# Patient Record
Sex: Male | Born: 1935 | Race: White | Hispanic: No | Marital: Married | Smoking: Former smoker
Health system: Southern US, Community
[De-identification: ages and names within clinical notes are randomized; demographics above are authoritative.]

## PROBLEM LIST (undated history)

## (undated) DIAGNOSIS — I255 Ischemic cardiomyopathy: Secondary | ICD-10-CM

## (undated) DIAGNOSIS — E782 Mixed hyperlipidemia: Secondary | ICD-10-CM

## (undated) DIAGNOSIS — N529 Male erectile dysfunction, unspecified: Secondary | ICD-10-CM

## (undated) DIAGNOSIS — Z66 Do not resuscitate: Secondary | ICD-10-CM

## (undated) DIAGNOSIS — M1990 Unspecified osteoarthritis, unspecified site: Secondary | ICD-10-CM

## (undated) DIAGNOSIS — I469 Cardiac arrest, cause unspecified: Secondary | ICD-10-CM

## (undated) DIAGNOSIS — I10 Essential (primary) hypertension: Secondary | ICD-10-CM

## (undated) DIAGNOSIS — I219 Acute myocardial infarction, unspecified: Secondary | ICD-10-CM

## (undated) DIAGNOSIS — I2510 Atherosclerotic heart disease of native coronary artery without angina pectoris: Secondary | ICD-10-CM

## (undated) DIAGNOSIS — M069 Rheumatoid arthritis, unspecified: Secondary | ICD-10-CM

## (undated) HISTORY — DX: Mixed hyperlipidemia: E78.2

## (undated) HISTORY — DX: Acute myocardial infarction, unspecified: I21.9

## (undated) HISTORY — DX: Male erectile dysfunction, unspecified: N52.9

## (undated) HISTORY — DX: Essential (primary) hypertension: I10

## (undated) HISTORY — DX: Unspecified osteoarthritis, unspecified site: M19.90

## (undated) HISTORY — PX: CARPAL TUNNEL RELEASE: SHX101

## (undated) HISTORY — PX: NECK SURGERY: SHX720

## (undated) HISTORY — DX: Atherosclerotic heart disease of native coronary artery without angina pectoris: I25.10

## (undated) HISTORY — PX: ROTATOR CUFF REPAIR: SHX139

---

## 1985-08-06 HISTORY — PX: CORONARY ARTERY BYPASS GRAFT: SHX141

## 1997-12-30 ENCOUNTER — Other Ambulatory Visit: Admission: RE | Admit: 1997-12-30 | Discharge: 1997-12-30 | Payer: Self-pay | Admitting: Cardiology

## 2000-12-31 ENCOUNTER — Ambulatory Visit (HOSPITAL_COMMUNITY): Admission: RE | Admit: 2000-12-31 | Discharge: 2000-12-31 | Payer: Self-pay | Admitting: Dermatology

## 2001-09-29 ENCOUNTER — Encounter: Payer: Self-pay | Admitting: Neurosurgery

## 2001-10-01 ENCOUNTER — Ambulatory Visit (HOSPITAL_COMMUNITY): Admission: RE | Admit: 2001-10-01 | Discharge: 2001-10-01 | Payer: Self-pay | Admitting: Neurosurgery

## 2002-03-18 ENCOUNTER — Encounter: Payer: Self-pay | Admitting: Neurosurgery

## 2002-03-18 ENCOUNTER — Ambulatory Visit (HOSPITAL_COMMUNITY): Admission: RE | Admit: 2002-03-18 | Discharge: 2002-03-18 | Payer: Self-pay | Admitting: Neurosurgery

## 2002-04-02 ENCOUNTER — Encounter: Payer: Self-pay | Admitting: Neurosurgery

## 2002-04-02 ENCOUNTER — Inpatient Hospital Stay (HOSPITAL_COMMUNITY): Admission: RE | Admit: 2002-04-02 | Discharge: 2002-04-03 | Payer: Self-pay | Admitting: Neurosurgery

## 2003-04-01 ENCOUNTER — Other Ambulatory Visit: Admission: RE | Admit: 2003-04-01 | Discharge: 2003-04-01 | Payer: Self-pay | Admitting: Unknown Physician Specialty

## 2003-04-25 ENCOUNTER — Emergency Department (HOSPITAL_COMMUNITY): Admission: EM | Admit: 2003-04-25 | Discharge: 2003-04-26 | Payer: Self-pay | Admitting: *Deleted

## 2003-04-25 ENCOUNTER — Encounter: Payer: Self-pay | Admitting: *Deleted

## 2003-04-29 ENCOUNTER — Other Ambulatory Visit: Admission: RE | Admit: 2003-04-29 | Discharge: 2003-04-29 | Payer: Self-pay | Admitting: Dermatology

## 2003-11-02 ENCOUNTER — Ambulatory Visit (HOSPITAL_COMMUNITY): Admission: RE | Admit: 2003-11-02 | Discharge: 2003-11-02 | Payer: Self-pay | Admitting: Unknown Physician Specialty

## 2003-11-15 ENCOUNTER — Ambulatory Visit (HOSPITAL_COMMUNITY): Admission: RE | Admit: 2003-11-15 | Discharge: 2003-11-15 | Payer: Self-pay | Admitting: Unknown Physician Specialty

## 2003-12-01 ENCOUNTER — Encounter: Admission: RE | Admit: 2003-12-01 | Discharge: 2004-01-27 | Payer: Self-pay | Admitting: Orthopaedic Surgery

## 2004-01-19 ENCOUNTER — Ambulatory Visit (HOSPITAL_COMMUNITY): Admission: RE | Admit: 2004-01-19 | Discharge: 2004-01-19 | Payer: Self-pay | Admitting: Orthopaedic Surgery

## 2004-03-27 ENCOUNTER — Encounter: Admission: RE | Admit: 2004-03-27 | Discharge: 2004-06-25 | Payer: Self-pay | Admitting: Orthopedic Surgery

## 2004-06-26 ENCOUNTER — Encounter: Admission: RE | Admit: 2004-06-26 | Discharge: 2004-08-21 | Payer: Self-pay | Admitting: Orthopedic Surgery

## 2004-08-16 ENCOUNTER — Ambulatory Visit (HOSPITAL_COMMUNITY): Admission: RE | Admit: 2004-08-16 | Discharge: 2004-08-16 | Payer: Self-pay | Admitting: Cardiology

## 2005-03-19 ENCOUNTER — Ambulatory Visit: Payer: Self-pay | Admitting: Family Medicine

## 2005-04-02 ENCOUNTER — Ambulatory Visit: Payer: Self-pay | Admitting: Family Medicine

## 2007-06-23 ENCOUNTER — Encounter: Admission: RE | Admit: 2007-06-23 | Discharge: 2007-06-23 | Payer: Self-pay | Admitting: Neurosurgery

## 2007-07-01 ENCOUNTER — Encounter: Admission: RE | Admit: 2007-07-01 | Discharge: 2007-08-06 | Payer: Self-pay | Admitting: Neurosurgery

## 2007-08-07 ENCOUNTER — Encounter: Admission: RE | Admit: 2007-08-07 | Discharge: 2007-11-05 | Payer: Self-pay | Admitting: Neurosurgery

## 2007-11-07 ENCOUNTER — Encounter: Admission: RE | Admit: 2007-11-07 | Discharge: 2007-12-23 | Payer: Self-pay | Admitting: Neurosurgery

## 2008-01-02 ENCOUNTER — Encounter: Admission: RE | Admit: 2008-01-02 | Discharge: 2008-01-02 | Payer: Self-pay | Admitting: Obstetrics and Gynecology

## 2008-01-20 ENCOUNTER — Inpatient Hospital Stay (HOSPITAL_COMMUNITY): Admission: EM | Admit: 2008-01-20 | Discharge: 2008-01-22 | Payer: Self-pay | Admitting: Emergency Medicine

## 2008-05-31 ENCOUNTER — Encounter: Payer: Self-pay | Admitting: Cardiology

## 2010-04-18 ENCOUNTER — Ambulatory Visit: Payer: Self-pay | Admitting: Cardiology

## 2010-10-18 ENCOUNTER — Ambulatory Visit (INDEPENDENT_AMBULATORY_CARE_PROVIDER_SITE_OTHER): Payer: Medicare Other | Admitting: Cardiology

## 2010-10-18 DIAGNOSIS — I2510 Atherosclerotic heart disease of native coronary artery without angina pectoris: Secondary | ICD-10-CM

## 2010-12-19 NOTE — Discharge Summary (Signed)
[name], [name]                 ACCOUNT NO.:  [id number]   MEDICAL RECORD NO.:  [id number]          PATIENT TYPE:  INP   LOCATION:  A306                          FACILITY:  [facility]   PHYSICIAN:  Osvaldo Shipper, MD     DATE OF BIRTH:  [birth year]   DATE OF ADMISSION:  01/20/2008  DATE OF DISCHARGE:  06/18/2009LH                               DISCHARGE SUMMARY   HISTORY OF PRESENT ILLNESS:  Please review H&P dictated by Dr. Flonnie Overman for  details regarding the patient's presenting illness.   PRIMARY MEDICAL DOCTOR:  The patient does not have a PMD.  He just sees  his cardiologist, Dr. Deborah Chalk, in [place].   He was seen here in the hospital in consultation by Dr. Gerilyn Pilgrim.   DISCHARGE DIAGNOSES:  1. Gait impairment, transient, unclear etiology.  2. History of vertigo which may have played a role.  3. Hypertension.  4. Coronary artery disease and CABG.  5. Possible history of CHF.   BRIEF HOSPITAL COURSE:  Briefly, this is a 77 year old Caucasian male  who has a past medical history of hypertension, vertigo, dyslipidemia  who was in his usual state of health until the day of his admission when  he apparently fell down.  According to his family, he did not pass out.  The patient had any chest pain, shortness of breath, headaches,.  Did  not have any focal deficits.  The patient does drink some alcohol about  two drinks every week.  The patient was evaluated initially in the ED  where he underwent a CT of his head which did not show any acute  abnormalities.  Chest x-ray showed cardiomegaly status post CABG, COPD  was also noted.  Dr. Gerilyn Pilgrim was consulted who felt that the patient  was malnourished and recommended thiamine.  He also recommended an MRI  which did not show any acute abnormalities.  Some nonspecific findings  in the left transverse and sigmoid sinuses were noted.  I did discuss  these findings with Dr. Gerilyn Pilgrim and he felt that this was probably some  congenital  anomaly.  He would like to see this patient in his office in  two week's time.  He did not recommend any further workup for this  abnormality noted on the MRI.  In the meantime, the patient did well.  His telemetry just showed a few PVCs.  He was ambulated by physical  therapist which he did was able to do with a cane.  His labs were all  unremarkable.  Cardiac panel was negative.  A TSH was low at 0.349, so  repeat TSH and free T4 is pending.   So the reason for his fall is not very clear.  He does have chronic back  pain.  MRI has been done in the past as recently as November 2008 and it  did show some scoliosis and some antral listhesis in L3 and L4, but  nothing really of concern.  Incidentally, massive hydronephrosis on the  left side was also noted at that time.  I do not know if he has had  any  further intervention done or any further evaluation done or not for that  hydronephrosis.  In any case, he does not have any motor deficits in the  lower extremities.  He was able to ambulate with his cane.  I think  probably he may have had a vertiginous attack that could have caused  this.  Overall etiology is unclear.  We have ruled out pretty much all  major gross problems which are especially neurological which could have  caused this, so, I think patient is stable to go home.  He will need to  follow-up with Dr. Deborah Chalk in 1-2 weeks, and he will also need to see  Dr. Gerilyn Pilgrim in 2 weeks.  For his PVCs, he is already on a beta blocker,  so I am not overly concerned about that at this time.  I would defer to  his cardiologist for further management.   DISCHARGE MEDICATIONS:  1. Thiamine 100 mg p.o. daily.  2. Felodipine 5 mg daily.  3. Meclizine 25 mg as needed.  4. HCTZ 25 mg daily.  5. Atenolol 25 mg daily.  6. Diovan 80 mg daily.  7. Zetia 10 mg daily.  8. Simvastatin 20 mg daily.  9. Lasix 40 mg two times a day.  10.Diclofenac sodium 50 mg one to two times a day.   DISCHARGE  INSTRUCTIONS:  1. He is to follow up with Dr. Deborah Chalk in 1-2 weeks, with Dr. Gerilyn Pilgrim      in 2 weeks.  2. Diet:  Heart-healthy.  3. Physical activity.  Use a cane, but no exertion.   CONSULTATION:  During this admission, Dr. Gerilyn Pilgrim.   TOTAL TIME OF DISCHARGE:  Thirty five minutes.      Osvaldo Shipper, MD  Electronically Signed     GK/MEDQ  D:  01/22/2008  T:  01/22/2008  Job:  [id number]   cc:   Colleen Can. Deborah Chalk, M.D.  Fax: [phone]   Kofi A. Gerilyn Pilgrim, M.D.  Fax: [phone]

## 2010-12-19 NOTE — Consult Note (Signed)
[name], [name]                 ACCOUNT NO.:  [id number]   MEDICAL RECORD NO.:  [id number]          PATIENT TYPE:  INP   LOCATION:  A306                          FACILITY:  [facility]   PHYSICIAN:  Kofi A. Gerilyn Pilgrim, M.D. DATE OF BIRTH:  1933-04-15   DATE OF CONSULTATION:  01/22/2008  DATE OF DISCHARGE:                                 CONSULTATION   The patient is a 77 year old Caucasian man who apparently presented  complaining of head injury.  History provided by the patient.  The  patient reports that he was walking around when he suddenly turned to  the left, and essentially fell somewhat backwards hitting the left part  of his head.  He denies any loss of consciousness.  No focal weaknesses  reported.  No numbness reported.  He does seem to have the right eye  closed, and when asked about diplopia he endorses having diplopia  looking either to the right or to the left.  It is a binocular type of  diplopia.  It seems to be that the images are side to side, and they are  further apart looking to the right.  The patient denies history of  falls.  Reports that essentially that this is his first fall.  In  examining the patient he was noted to have significant left upper  extremity weakness.  He reports that he fell a few years ago and injured  muscles there.  The patient looks somewhat malnourished, but denies any  drinking history.  Reports only drinking about two drinks in a week.  It  appears that the patient had angiography done a couple weeks ago so  unclear why this was done.  Their reports indicate that he has had  blurred vision and on the right temporal headaches.  The indication from  the CT scan angio also reports question of stroke, right hip weakness,  and prior head injury.   PAST MEDICAL HISTORY:  Significant for head injury, hyperlipidemia,  vertigo, hypertension.   PAST SURGICAL HISTORY:  None.   ALLERGIES:  No known drug allergies.   ADMISSION MEDICATION:  1.  Felodipine.  2. Meclizine.  3. Hydrochlorothiazide.  4. Atenolol.  5. Diovan.  6. Zetia.  7. Simvastatin.  8. Lasix.  9. Diclofenac.   REVIEW OF SYSTEMS:  See History of Present Illness; otherwise,  unrevealing.  The patient apparently had some alteration of mentation,  although this has improved significantly and he is able to converse.   FAMILY HISTORY:  Unrevealing.   PHYSICAL EXAMINATION:  GENERAL:  A thin malnourished appearing man in no  acute distress.  VITAL SIGNS:  Temperature 98.2, pulse 57, respirations 20, blood  pressure 115/62.  HEENT:  The patient has a bandage on the left frontal region.  NECK:  Supple.  ABDOMEN:  Soft.  EXTREMITIES:  No significant edema.  NEUROLOGICAL:  The patient is awake and alert.  He converses well.  Speech is normal.  Language and cognition also normal.  Cranial nerve  evaluation:  The patient has full extraocular movements, but he does  have sustained nystagmus  on end gaze bilaterally actually more to the  left.  Visual fields are intact.  Facial muscles are symmetric.  Tongue  is midline.  Uvula is midline.  Motor examination shows left upper  extremity weakness and drift.  Strength is greater than 4/5.  Other  extremities show normal tone, bulk, and strength.  Reflexes are  diminished in the left upper extremity +1 except the triceps which is  +3.  Biceps on the right upper extremities are normal, but the breaker  radialis, triceps reflexes are 3+.  He is 3+ at the knees.  Passive  reflexes of the ankles and downgoing plantar reflexes do seem to have  somewhat increased tone in the legs.  Sensation is symmetric.  Coordination shows no tremors, past pointing, dysmetria or parkinsonism.  Gait is somewhat unsteady, but, otherwise, unrevealing.   Head CT scan shows nothing acute.  Again the patient did have a CT  angiography done which showed extensive atherosclerotic calcification.  There is a cavernous segment ICA stenosis of 20-40%  on the left and 20-  40% on the right.  There is a 30% stenosis of the posterior MCA  division.  There is a dominant right vertebral 28% stenosis.  There is  also hypoplastic left ACA.   LABORATORY DATA:  CPK 111.  Sodium 135, potassium 4.2, chloride 99, CO2  25, glucose 102, BUN 21, creatinine 1.2.  Calcium 9.9.  Liver enzymes  are fine.  Urinalysis:  Trace protein, negative nitrite, negative  leukocytes.  Hemoglobin 16.   ASSESSMENT:  Gait impairment that may be acute.  Etiology unclear.  The  pattern of the patient's examination, however, suggests  possible  thiamine deficiency state/Wernicke's encephalopathy.  The patient may  also have gait problems due to thiamine deficiency state.  The  examination does not support the acute ischemic process.  He does not  necessarily appear to be myelopathic.   RECOMMENDATIONS:  Additional labs for B12 deficiency, homocystine level.  MRI will also be obtained.  Physical therapy is suggested.      Kofi A. Gerilyn Pilgrim, M.D.  Electronically Signed     KAD/MEDQ  D:  01/22/2008  T:  01/22/2008  Job:  [id number]

## 2010-12-22 NOTE — H&P (Signed)
[name], [name]                 ACCOUNT NO.:  [id number]   MEDICAL RECORD NO.:  [id number]          PATIENT TYPE:  INP   LOCATION:  A306                          FACILITY:  [facility]   PHYSICIAN:  Lucita Ferrara, MD         DATE OF BIRTH:  02-03-1933   DATE OF ADMISSION:  01/20/2008  DATE OF DISCHARGE:  06/18/2009LH                              HISTORY & PHYSICAL   HISTORY OF PRESENT ILLNESS:  Patient is a 77 year old male who presented  to [facility] with presyncopal episode that was not  preceded by a chest pain, shortness of breath or headaches, or focal  neurological deficits.  Apparently the patient was drinking earlier in  the day on January 20, 2008 a moderate amount of alcohol of two drinks.  The patient was evaluated by the emergency room and a CT scan did not  show any acute abnormalities.  Patient however did fall, but did not  have any acute trauma.  The patient does complain of the room spinning.  The patient had some diplopia was that is binocular in etiology.  The  patient denies a history of falls, but states that this is his first  fall.   PAST MEDICAL HISTORY:  1. Vertigo.  2. Hypertension.  3. History of head injury.  4. Hyperlipidemia.  5. Coronary artery disease status post coronary artery bypass graft.  6. History of congestive heart failure.   PAST SURGICAL HISTORY:  None.   ALLERGIES:  NO KNOWN DRUG ALLERGIES.   MEDICATIONS:  1. Felodipine.  2. Meclizine.  3. Hydrochlorothiazide.  4. Atenolol.  5. Diovan.  6. Zetia.  7. Simvastatin.  8. Lasix.  9. Diclofenac.   REVIEW OF SYSTEMS:  As per HPI, otherwise negative.  He denies any  nausea, vomiting, hematemesis, hematochezia, dark or black stools, chest  pain, shortness of breath, focal neurological deficits other than what  is described in the history of present illness.   FAMILY HISTORY:  Noncontributory.   PHYSICAL EXAMINATION:  GENERAL:  Patient is thin, malnutrition and  cachectic,  in no acute distress.  VITAL SIGNS:  Temperature 98.2, pulse is 57, respirations 20, blood  pressure 115/62.  HEENT:  Normocephalic/atraumatic, sclerae anicteric.  PERRLA,  extraocular muscles intact.  Left frontal area is bandaged.  NECK:  Supple, no JVD, no carotid bruits.  ABDOMEN:  Soft, nontender, nondistended.  Positive bowel sounds.  EXTREMITIES:  No clubbing, cyanosis or edema.  NEURO:  Patient is alert and oriented x3, cranial nerves II-XII grossly  intact, visual fields are intact.  Strength is to me 5/5 bilaterally.   CT scan of the head:  No acute process noted.  Other than extensive  atherosclerotic calcifications and there is a cavernous segment of the  internal carotid artery, stenosis of 20%-40% and a left 20%-40% seen  with CT angiography.   LABORATORY DATA:  CPK 111, sodium 135, potassium is 4.2, CO2 25, BUN 21,  creatinine 2.1.   ASSESSMENT:  1. Gait impairment.  2. Moderate alcohol use, rule out thiamine Wernicke's encephalopathy.  3. History  of vertigo.  4. Hypertension, controlled.  5. History of coronary artery disease status post coronary artery      bypass graft.  6. History of congestive heart failure.   DISCUSSION AND PLAN:  We will go ahead and admit him to medical  telemetry unit.  He will likely need a Neurology consultation.  We will  cycle his cardiac enzymes x3 every 8 hours.  Will proceed with an MRI of  the brain, will get thyroid function tests.  We will monitor his  electrolytes.  Strict fall precautions will be advised.  DVT and GI  prophylaxis unless no contraindications.  If needed, we will have to get  Neurology and/or Cardiology involved.      Lucita Ferrara, MD  Electronically Signed     RR/MEDQ  D:  03/18/2008  T:  03/18/2008  Job:  [id number]

## 2010-12-22 NOTE — Op Note (Signed)
NAMEWARREN, LINDAHL                           ACCOUNT NO.:  [id number]   MEDICAL RECORD NO.:  [id number]                   PATIENT TYPE:  INP   LOCATION:  3006                                 FACILITY:  [facility]   PHYSICIAN:  Hewitt Shorts, M.D.            DATE OF BIRTH:  1932/09/12   DATE OF PROCEDURE:  DATE OF DISCHARGE:  04/03/2002                                 OPERATIVE REPORT   PREOPERATIVE DIAGNOSES:  Cervical stenosis, spondylosis and degenerative  disk disease.   POSTOPERATIVE DIAGNOSES:  Cervical stenosis, spondylosis and degenerative  disk disease.   OPERATION PERFORMED:  C5 anterior cervical corpectomy and C4 to C6 anterior  cervical arthrodesis with iliac crest allograft and Premier cervical  plating.   SURGEON:  Hewitt Shorts, M.D.   ASSISTANT:   ANESTHESIA:  General endotracheal.   INDICATIONS FOR PROCEDURE:  The patient is a 77 year old man who presented  with numbness in his hands, right worth than left.  He was found by MRI scan  to have advanced degenerative disk disease with spondylosis, worse at C4-5  and C5-6 levels with stenosis extending from the posterior aspect of the  inferior aspect of C4 to the posterior superior aspect of the C6 extending  behind the body of C5.  Decision was therefore made to proceed with a C5  corpectomy and a C4 to 6 arthrodesis.   DESCRIPTION OF PROCEDURE:  The patient was brought to the operating room and  placed under general endotracheal anesthesia.  The patient was placed in 10  pounds of halter traction.  The neck was prepped with Betadine soap and  solution and draped in sterile fashion.  A horizontal incision was made on  the left side of the neck.  The line of the incision was infiltrated with  local anesthetic with epinephrine.  Incision was made with a Shaw scalpel at  a temperature of 120.  Dissection was carried down through subcutaneous  tissues and platysma.  Dissection was then carried out to an  avascular plane  leaving the sternocleidomastoid muscle, carotid artery and jugular vein  laterally and trachea and esophagus medially.  The ventral aspects of the  vertebral column were identified and a localizing x-ray was taken.  The C4-5  and C5-6 intervertebral disk spaces identified.  Anterior osteophytic  overgrowth was removed using double action rongeurs and the disk spaces were  entered and diskectomy was performed with microcurets and pituitary  rongeurs.  The cauterized end plates were removed using microcurets and  subsequently with the micro Max drill.  Self-retaining retractors were  placed.  The operating microscope was draped and brought into the field to  provide additional magnification, illumination and visualization and the  remainder of the procedure was performed using microdissection and  microsurgical technique.  The C5 corpectomy was performed using the micro  Max drill to remove the great portion of the anterior aspect  of the C5  vertebral body.  As we worked posteriorly, the posterior cortex was thinned  down to a thin eggshell and this was carefully removed using a 2 mm  Kerrison punch with a thin foot plate.  The posterior longitudinal ligament  was markedly thickened and this was carefully removed from the inferior  aspect of C4 to the superior aspect of C6.  Good decompression of the thecal  sac was achieved.  We then further prepared the end plates of C4 and C6 and  placed Caspar distraction pins in each of the vertebrae.  Gentle distraction  was then created and we prepared a wedge of iliac crest allograft.  It was  cut and shaped using an oscillating saw and the graft was positioned in the  corpectomy defect and countersunk.  Distraction was then relaxed.  Traction  was discontinued.  The distraction pins were removed and we selected a 37.5  mm Premier plate.  It was positioned over the fusion construct and secured  with 4.0 x 15 mm screws, a pair of  screws at both C4 and C6.  We used a  fixed guide at C6 and a variable angled guide at C4.  We then drilled and  tapped a hole into the graft and placed a 10 mm  4.0 screw.  Once all the screws were fully tightened, the locking system was  secured.  The wound was irrigated with bacitracin solution, was checked for  hemostasis, was established and confirmed.  An x-ray was taken which showed  the graft, plate and screws all to be in good condition. The alignment was  good and then we proceeded with closure.  The platysma was closed with  inverted interrupted 2-0 undyed Vicryl sutures, the subcutaneous and  subcuticular layer were closed with inverted interrupted 3-0 undyed Vicryl  sutures and the skin edges were approximated with Dermabond.  The patient  tolerated the procedure well.  The estimated blood loss for this procedure  was  75 cc.  Sponge and needle counts were correct at the end of this case.  Following surgery the patient is to be reversed from anesthetic, extubated  and transferred to the recovery room for further care.  He was placed in an  Aspen collar at the completion of surgery.                                                 Hewitt Shorts, M.D.    RWN/MEDQ  D:  04/02/2002  T:  04/06/2002  Job:  [id number]

## 2011-04-26 ENCOUNTER — Encounter: Payer: Self-pay | Admitting: Cardiology

## 2011-04-26 ENCOUNTER — Ambulatory Visit (INDEPENDENT_AMBULATORY_CARE_PROVIDER_SITE_OTHER): Payer: Medicare Other | Admitting: Cardiology

## 2011-04-26 VITALS — BP 152/69 | HR 55 | Resp 18 | Ht 63.0 in | Wt 118.0 lb

## 2011-04-26 DIAGNOSIS — E782 Mixed hyperlipidemia: Secondary | ICD-10-CM

## 2011-04-26 DIAGNOSIS — Z79899 Other long term (current) drug therapy: Secondary | ICD-10-CM

## 2011-04-26 DIAGNOSIS — I10 Essential (primary) hypertension: Secondary | ICD-10-CM

## 2011-04-26 DIAGNOSIS — I2510 Atherosclerotic heart disease of native coronary artery without angina pectoris: Secondary | ICD-10-CM | POA: Insufficient documentation

## 2011-04-26 NOTE — Progress Notes (Signed)
Clinical Summary [name] is a 77 y.o.male previously followed by Dr. Deborah Walker. He is establishing followup with me in [place]. His history is reviewed below.   He reports no active angina or nitroglycerin requirement and has stable NYHA class II dyspnea. Somewhat functionally limited by rheumatoid arthritis and uses a cane.  He prefers to hold on on followup stress testing, which has been the case on review of his old office noted as well.  He is due for followup lipids.   No Known Allergies  Medication list reviewed.  Past Medical History  Diagnosis Date  . Coronary atherosclerosis of native coronary artery     Multivessel  . Essential hypertension, benign   . Mixed hyperlipidemia   . Erectile dysfunction   . Osteoarthritis   . Myocardial infarction     IMI 1987 and PLMI 1997  . Rheumatoid aortitis     Past Surgical History  Procedure Date  . Coronary artery bypass graft 1987    Family History  Problem Relation Age of Onset  . Coronary artery disease Father     Died age 28 with MI    Social History [name] reports that he has been smoking Cigarettes.  He has never used smokeless tobacco. [name] reports that he drinks alcohol.  Review of Systems No palpitations or syncope. No orthopnea or PND.  Physical Examination Filed Vitals:   04/26/11 1317  BP: 152/69  Pulse: 55  Resp: 18  Chronically ill appearing male in no acute distress. HEENT: Conjuctivae normal, oropharynx with poor dentition. Neck: No bruits, no thyromegaly. Lungs: Decreased breath sound without wheezes. Cardiac: Regular rate and rhythm with soft basal systolic murmur. Abdomen: Soft, NABS. Skin: Dry, Extremities: Arthritic deformities, no pitting edema. Neuropsychiatric: Alert and oriented x 3, affect appropriate.   ECG Sinus bradycardia with ICRBBB, old inferior infarct pattern, nonspecific ST changes.   Problem List and Plan

## 2011-04-26 NOTE — Patient Instructions (Signed)
Your physician wants you to follow-up in: 6 months. You will receive a reminder letter in the mail one-two months in advance. If you don't receive a letter, please call our office to schedule the follow-up appointment. Your physician recommends that you go to the the lab for a FASTING lipid profile and liver function labs. Do not eat or drink after midnight.

## 2011-04-26 NOTE — Assessment & Plan Note (Signed)
Followup fasting lipids and liver function testing.

## 2011-04-26 NOTE — Assessment & Plan Note (Signed)
Clinically stable on medical therapy. He does not want to proceed with any followup stress testing at this point. We discussed warning signs and symptoms. Continue observation.

## 2011-04-26 NOTE — Assessment & Plan Note (Signed)
Blood pressure is elevated today. He reports compliance with his medications.

## 2011-05-03 LAB — URINALYSIS, ROUTINE W REFLEX MICROSCOPIC
Glucose, UA: NEGATIVE
Specific Gravity, Urine: 1.015
Urobilinogen, UA: 1
pH: 6.5

## 2011-05-03 LAB — COMPREHENSIVE METABOLIC PANEL
ALT: 14
Albumin: 4.1
Alkaline Phosphatase: 47
Chloride: 99
Glucose, Bld: 102 — ABNORMAL HIGH
Potassium: 4.2
Sodium: 135
Total Bilirubin: 1.2
Total Protein: 7.2

## 2011-05-03 LAB — CBC
HCT: 46.5
Hemoglobin: 16.2
RDW: 14
WBC: 7.7

## 2011-05-03 LAB — T4, FREE: Free T4: 2.07 — ABNORMAL HIGH

## 2011-05-03 LAB — B-NATRIURETIC PEPTIDE (CONVERTED LAB): Pro B Natriuretic peptide (BNP): 78

## 2011-05-03 LAB — DIFFERENTIAL
Basophils Absolute: 0.1
Basophils Relative: 1
Eosinophils Absolute: 0
Monocytes Absolute: 0.9
Monocytes Relative: 12
Neutrophils Relative %: 71

## 2011-05-03 LAB — CK TOTAL AND CKMB (NOT AT ARMC)
CK, MB: 2.4
Total CK: 111

## 2011-05-03 LAB — URINE MICROSCOPIC-ADD ON

## 2011-05-03 LAB — CARDIAC PANEL(CRET KIN+CKTOT+MB+TROPI): Total CK: 366 — ABNORMAL HIGH

## 2011-05-03 LAB — TSH
TSH: 0.349 — ABNORMAL LOW
TSH: 0.546

## 2011-05-03 LAB — FOLATE: Folate: >20

## 2011-05-08 ENCOUNTER — Encounter: Payer: Self-pay | Admitting: Cardiology

## 2011-06-01 ENCOUNTER — Other Ambulatory Visit: Payer: Self-pay | Admitting: Cardiology

## 2011-06-02 LAB — HEPATIC FUNCTION PANEL
AST: 25 U/L (ref 0–37)
Albumin: 4.7 g/dL (ref 3.5–5.2)
Alkaline Phosphatase: 60 U/L (ref 39–117)
Bilirubin, Direct: 0.1 mg/dL (ref 0.0–0.3)
Total Bilirubin: 0.5 mg/dL (ref 0.3–1.2)

## 2011-06-02 LAB — LIPID PANEL: HDL: 55 mg/dL (ref >39)

## 2011-07-13 ENCOUNTER — Encounter: Payer: Self-pay | Admitting: *Deleted

## 2011-07-13 NOTE — Telephone Encounter (Signed)
This encounter was created in error - please disregard.

## 2011-07-14 ENCOUNTER — Encounter (HOSPITAL_COMMUNITY): Payer: Self-pay | Admitting: Emergency Medicine

## 2011-07-14 ENCOUNTER — Emergency Department (HOSPITAL_COMMUNITY): Payer: Medicare Other

## 2011-07-14 ENCOUNTER — Other Ambulatory Visit: Payer: Self-pay

## 2011-07-14 ENCOUNTER — Emergency Department (HOSPITAL_COMMUNITY)
Admission: EM | Admit: 2011-07-14 | Discharge: 2011-07-14 | Disposition: A | Discharge status: Home / Self Care | Payer: Medicare Other | Attending: Emergency Medicine | Admitting: Emergency Medicine

## 2011-07-14 DIAGNOSIS — J069 Acute upper respiratory infection, unspecified: Secondary | ICD-10-CM

## 2011-07-14 DIAGNOSIS — I4949 Other premature depolarization: Secondary | ICD-10-CM | POA: Insufficient documentation

## 2011-07-14 DIAGNOSIS — J111 Influenza due to unidentified influenza virus with other respiratory manifestations: Secondary | ICD-10-CM | POA: Insufficient documentation

## 2011-07-14 DIAGNOSIS — Z951 Presence of aortocoronary bypass graft: Secondary | ICD-10-CM | POA: Insufficient documentation

## 2011-07-14 DIAGNOSIS — E785 Hyperlipidemia, unspecified: Secondary | ICD-10-CM | POA: Insufficient documentation

## 2011-07-14 DIAGNOSIS — N529 Male erectile dysfunction, unspecified: Secondary | ICD-10-CM | POA: Insufficient documentation

## 2011-07-14 DIAGNOSIS — M069 Rheumatoid arthritis, unspecified: Secondary | ICD-10-CM | POA: Insufficient documentation

## 2011-07-14 DIAGNOSIS — I10 Essential (primary) hypertension: Secondary | ICD-10-CM | POA: Insufficient documentation

## 2011-07-14 DIAGNOSIS — I4510 Unspecified right bundle-branch block: Secondary | ICD-10-CM | POA: Insufficient documentation

## 2011-07-14 DIAGNOSIS — I252 Old myocardial infarction: Secondary | ICD-10-CM | POA: Insufficient documentation

## 2011-07-14 DIAGNOSIS — F17200 Nicotine dependence, unspecified, uncomplicated: Secondary | ICD-10-CM | POA: Insufficient documentation

## 2011-07-14 DIAGNOSIS — I2510 Atherosclerotic heart disease of native coronary artery without angina pectoris: Secondary | ICD-10-CM | POA: Insufficient documentation

## 2011-07-14 DIAGNOSIS — Z7982 Long term (current) use of aspirin: Secondary | ICD-10-CM | POA: Insufficient documentation

## 2011-07-14 DIAGNOSIS — R0602 Shortness of breath: Secondary | ICD-10-CM | POA: Insufficient documentation

## 2011-07-14 LAB — BASIC METABOLIC PANEL
Calcium: 9.5 mg/dL (ref 8.4–10.5)
Creatinine, Ser: 1.25 mg/dL (ref 0.50–1.35)
GFR calc Af Amer: 63 mL/min — ABNORMAL LOW (ref >90)

## 2011-07-14 LAB — DIFFERENTIAL
Basophils Absolute: 0 10*3/uL (ref 0.0–0.1)
Basophils Relative: 0 % (ref 0–1)
Eosinophils Absolute: 0 10*3/uL (ref 0.0–0.7)
Eosinophils Relative: 0 % (ref 0–5)
Monocytes Absolute: 1.8 10*3/uL — ABNORMAL HIGH (ref 0.1–1.0)

## 2011-07-14 LAB — CBC
HCT: 44.2 % (ref 39.0–52.0)
MCHC: 34.6 g/dL (ref 30.0–36.0)
MCV: 87.9 fL (ref 78.0–100.0)
RDW: 14 % (ref 11.5–15.5)

## 2011-07-14 LAB — PRO B NATRIURETIC PEPTIDE: Pro B Natriuretic peptide (BNP): 4094 pg/mL — ABNORMAL HIGH (ref 0–450)

## 2011-07-14 MED ORDER — OSELTAMIVIR PHOSPHATE 75 MG PO CAPS: 75.0000 mg | ORAL_CAPSULE | Freq: Two times a day (BID) | ORAL | Status: AC

## 2011-07-14 MED ORDER — ALBUTEROL SULFATE (5 MG/ML) 0.5% IN NEBU
2.5000 mg | INHALATION_SOLUTION | Freq: Once | RESPIRATORY_TRACT | Status: AC
  Administered 2011-07-14: 2.5 mg via RESPIRATORY_TRACT
  Filled 2011-07-14: qty 0.5

## 2011-07-14 MED ORDER — ALBUTEROL SULFATE HFA 108 (90 BASE) MCG/ACT IN AERS: 2.0000 | INHALATION_SPRAY | RESPIRATORY_TRACT | Status: DC | PRN

## 2011-07-14 MED ORDER — IPRATROPIUM BROMIDE 0.02 % IN SOLN
0.5000 mg | Freq: Once | RESPIRATORY_TRACT | Status: AC
  Administered 2011-07-14: 0.5 mg via RESPIRATORY_TRACT
  Filled 2011-07-14: qty 2.5

## 2011-07-14 MED ORDER — DEXAMETHASONE SODIUM PHOSPHATE 10 MG/ML IJ SOLN
10.0000 mg | Freq: Once | INTRAMUSCULAR | Status: AC
  Administered 2011-07-14: 10 mg via INTRAVENOUS
  Filled 2011-07-14: qty 1

## 2011-07-14 MED ORDER — OSELTAMIVIR PHOSPHATE 75 MG PO CAPS
75.0000 mg | ORAL_CAPSULE | Freq: Once | ORAL | Status: AC
  Administered 2011-07-14: 75 mg via ORAL
  Filled 2011-07-14: qty 1

## 2011-07-14 MED ORDER — PREDNISONE 10 MG PO TABS: 20.0000 mg | ORAL_TABLET | Freq: Every day | ORAL | Status: AC

## 2011-07-14 NOTE — ED Provider Notes (Signed)
History  Scribed for Felisa Bonier, MD, the patient was seen in room APA02/APA02. This chart was scribed by Candelaria Stagers. The patient's care started at 4:43 PM    CSN: [id number] Arrival date & time: 07/14/2011  3:42 PM   First MD Initiated Contact with Patient 07/14/11 1628      Chief Complaint  Patient presents with  . Chest Pain  . Shortness of Breath     The history is provided by the patient.   [name] is a 77 y.o. male who presents to the Emergency Department complaining of SOB and a productive cough with green sputum that began a few days ago.  The pt is febrile.  He does not use O2 at home.  He denies diarrhea.  His PCP is Dr. Charm Barges.     Past Medical History  Diagnosis Date  . Coronary atherosclerosis of native coronary artery     Multivessel  . Essential hypertension, benign   . Mixed hyperlipidemia   . Erectile dysfunction   . Osteoarthritis   . Myocardial infarction     IMI 1987 and PLMI 1997  . Rheumatoid aortitis     Past Surgical History  Procedure Date  . Coronary artery bypass graft 1987  . Carpal tunnel release   . Rotator cuff repair   . Neck surgery     Family History  Problem Relation Age of Onset  . Coronary artery disease Father     Died age 67 with MI    History  Substance Use Topics  . Smoking status: Current Some Day Smoker    Types: Cigarettes  . Smokeless tobacco: Never Used  . Alcohol Use: Yes     Regular use      Review of Systems  Constitutional: Positive for fever.  HENT: Negative for rhinorrhea and neck pain.   Eyes: Negative for pain.  Respiratory: Positive for cough (productive) and shortness of breath.   Cardiovascular: Positive for chest pain.  Gastrointestinal: Negative for nausea, vomiting, abdominal pain and diarrhea.  Genitourinary: Negative for dysuria.  Musculoskeletal: Negative for back pain.  Skin: Negative for rash.  Neurological: Negative for dizziness and weakness.    Allergies  Review  of patient's allergies indicates no known allergies.  Home Medications   Current Outpatient Rx  Name Route Sig Dispense Refill  . ASPIRIN 81 MG PO TABS Oral Take 81 mg by mouth daily.      . ATENOLOL 25 MG PO TABS Oral Take 25 mg by mouth daily.      Marland Kitchen VITAMIN D 1000 UNITS PO TABS Oral Take 1,000 Units by mouth daily.      Marland Kitchen EZETIMIBE 10 MG PO TABS Oral Take 10 mg by mouth daily.      Marland Kitchen FELODIPINE ER 5 MG PO TB24 Oral Take 5 mg by mouth daily.      Marland Kitchen HYDROCHLOROTHIAZIDE 25 MG PO TABS Oral Take 25 mg by mouth daily.      Marland Kitchen SIMVASTATIN 20 MG PO TABS Oral Take 20 mg by mouth at bedtime.      Marland Kitchen VITAMIN B-1 250 MG PO TABS Oral Take 250 mg by mouth daily.      Marland Kitchen VALSARTAN 80 MG PO TABS Oral Take 80 mg by mouth daily.        BP 135/71  Pulse 68  Temp 100.3 F (37.9 C)  Resp 28  Ht 5\' 2"  (1.575 m)  Wt 120 lb (54.432 kg)  BMI 21.95 kg/m2  SpO2 96%  Physical Exam  Nursing note and vitals reviewed. Constitutional: He is oriented to person, place, and time. He appears well-developed and well-nourished. No distress.  HENT:  Head: Normocephalic and atraumatic.  Right Ear: Tympanic membrane normal.  Left Ear: Tympanic membrane normal.  Mouth/Throat: Oropharynx is clear and moist. No oropharyngeal exudate.       Oropharyngeal clear with no erythremia or edema  Eyes: EOM are normal. Pupils are equal, round, and reactive to light.  Neck: Neck supple. No tracheal deviation present.  Cardiovascular: Normal rate and regular rhythm.  Exam reveals no gallop and no friction rub.   No murmur heard.      Premature ventriclar contractions  Pulmonary/Chest: He is in respiratory distress (mild to moderate ). He has wheezes (on exhalation bilaterally, worse on right). He has rales (right inferior lung field).       Anteroposterior chest diameter increased to suggest COPD  Abdominal: Soft. He exhibits no distension.  Musculoskeletal: Normal range of motion. He exhibits no edema.  Neurological: He is  alert and oriented to person, place, and time. No sensory deficit.  Skin: Skin is warm and dry.  Psychiatric: He has a normal mood and affect. His behavior is normal.    ED Course  Procedures  DIAGNOSTIC STUDIES: Oxygen Saturation is 96% on room air, normal by my interpretation.    COORDINATION OF CARE:  5:02PM Ordered: PRO B NATRIURETIC PEPTIDE, BASIC METABOLIC PANEL, DIFFERENTIAL, CBC  5:10PM Ordered: Troponin I ; dexamethasone (DECADRON) injection 10 mg ; albuterol (PROVENTIL) (5 MG/ML) 0.5% nebulizer solution 2.5 mg ; ipratropium (ATROVENT) nebulizer solution 0.5 mg  6:51PM Recheck: Pt is improved after breathing treatment.  Discussed course of care with pt and advised pt to see PCP in two days and to return to ED is sx worsen.    Date: 07/14/2011  Rate: 61  Rhythm: normal sinus rhythm and premature ventricular contractions (PVC)   QRS Axis: right  Intervals: QRS widening from incomplete BBB  ST/T Wave abnormalities: nonspecific ST/T changes  Conduction Disutrbances:right bundle branch block  Narrative Interpretation: non-provocative ecg  Old EKG Reviewed: anterolateral ST depressions unchanged     Labs Reviewed  CBC - Abnormal; Notable for the following:    Platelets 133 (*)    All other components within normal limits  DIFFERENTIAL - Abnormal; Notable for the following:    Lymphocytes Relative 9 (*)    Monocytes Relative 18 (*)    Monocytes Absolute 1.8 (*)    All other components within normal limits  BASIC METABOLIC PANEL - Abnormal; Notable for the following:    Sodium 128 (*)    Chloride 91 (*)    Glucose, Bld 115 (*)    BUN 25 (*)    GFR calc non Af Amer 54 (*)    GFR calc Af Amer 63 (*)    All other components within normal limits  PRO B NATRIURETIC PEPTIDE - Abnormal; Notable for the following:    BNP, POC 4094.0 (*)    All other components within normal limits  TROPONIN I  INFLUENZA PANEL BY PCR   Dg Chest Portable 1 View  07/14/2011  *RADIOLOGY  REPORT*  Clinical Data: Shortness of breath.  PORTABLE CHEST - 1 VIEW  Comparison: A single view chest x-ray 01/20/2008 [facility].  Findings: The heart is mildly enlarged.  The patient is status post median sternotomy for CABG.  Mild pulmonary vascular congestion is evident without frank edema.  There are no  definite effusions.  No focal airspace disease is present.  Degenerative changes are noted in the shoulders bilaterally.  Postsurgical changes are evident in the cervical spine.  IMPRESSION:  1.  Mild cardiomegaly and pulmonary vascular congestion, suggesting early congestive heart failure. 2.  No focal airspace disease.  Original Report Authenticated By: Jamesetta Orleans. MATTERN, M.D.     No diagnosis found.  6:54 PM The patient was reevaluated by me after his breathing treatment at this time is in no respiratory distress with wheezing resolved. His chest x-ray shows no apparent pneumonia. I do suspect that the patient has influenza. Given that he is within the 48 hour window of the onset of his symptoms and given his comorbidities, I will treat him with Tamiflu to shorten the course of the severity of his illness. Otherwise I will treat him with oral steroids and inhaled bronchodilators to treat any shortness of breath. I asked the patient if he felt well and left to go home and he says he does and that he does not want to stay in the hospital at this time. I told him if his symptoms worsen to come back to the emergency department and he states that he would do so. Otherwise, I instructed him to followup with his primary care physician in 2 days if symptoms have not improved. He states his intention to do so. His family member who is with him states her understanding of and agreement with the plan of care.  MDM  The patient actually does not endorse chest pain per se when questioned about his recent illness. His symptoms seemed to be that of a respiratory nature, with dyspnea, productive  cough, and shortness of breath. With the presence of a fever I am concerned for pneumonia and will get a chest x-ray. His EKG does not show significant changes to suggest myocardial ischemia, but given his comorbidities and known coronary artery disease, I will obtain a troponin to further evaluate for myocardial ischemia or infarction. The B. natruretic peptide was ordered by nursing, based on symptoms of dyspnea and comorbidities suggestive of the diagnosis of congestive heart failure, although I am not aware of him carrying a diagnosis of congestive heart failure. The patient is a smoker and has an increased anteroposterior diameter to his chest suggestive of emphysema. I have ordered him a intravenous dose of steroid and inhaled bronchodilators by nebulizer to aid in bronchodilation and to improve his work of breathing.  I personally performed the services described in this documentation, which was scribed in my presence. The recorded information has been reviewed and considered.        Felisa Bonier, MD 07/14/11 [phone]

## 2011-07-14 NOTE — ED Notes (Signed)
Pt c/o cp x 2 days with sob today. Pt reports productive cough with green sputum. Some diarrhea. nad noted.

## 2011-07-16 ENCOUNTER — Telehealth: Payer: Self-pay

## 2011-07-16 MED ORDER — NITROGLYCERIN 0.4 MG SL SUBL: 0.4000 mg | SUBLINGUAL_TABLET | SUBLINGUAL | Status: DC | PRN

## 2011-07-16 NOTE — Telephone Encounter (Signed)
Pharmacist at [facility] states that pt. is requesting NTG. NTG is not listed in pt's medication list, is it ok to fill? Please advise./LV

## 2011-07-16 NOTE — Telephone Encounter (Signed)
It should be listed on his medication profile. He has known CAD. Okay to refill.

## 2011-10-24 ENCOUNTER — Ambulatory Visit: Payer: Medicare Other | Admitting: Cardiology

## 2011-10-29 ENCOUNTER — Encounter: Payer: Self-pay | Admitting: Cardiology

## 2011-10-29 ENCOUNTER — Ambulatory Visit (INDEPENDENT_AMBULATORY_CARE_PROVIDER_SITE_OTHER): Payer: Medicare Other | Admitting: Cardiology

## 2011-10-29 VITALS — BP 165/73 | HR 48 | Resp 18 | Ht 64.0 in | Wt 121.0 lb

## 2011-10-29 DIAGNOSIS — R600 Localized edema: Secondary | ICD-10-CM | POA: Insufficient documentation

## 2011-10-29 DIAGNOSIS — I10 Essential (primary) hypertension: Secondary | ICD-10-CM

## 2011-10-29 DIAGNOSIS — E785 Hyperlipidemia, unspecified: Secondary | ICD-10-CM

## 2011-10-29 DIAGNOSIS — I2510 Atherosclerotic heart disease of native coronary artery without angina pectoris: Secondary | ICD-10-CM

## 2011-10-29 DIAGNOSIS — E782 Mixed hyperlipidemia: Secondary | ICD-10-CM

## 2011-10-29 DIAGNOSIS — R609 Edema, unspecified: Secondary | ICD-10-CM

## 2011-10-29 MED ORDER — POTASSIUM CHLORIDE ER 10 MEQ PO CPCR: 10.0000 meq | ORAL_CAPSULE | Freq: Every day | ORAL | Status: DC

## 2011-10-29 MED ORDER — FUROSEMIDE 20 MG PO TABS: 20.0000 mg | ORAL_TABLET | Freq: Every day | ORAL | Status: DC

## 2011-10-29 NOTE — Patient Instructions (Signed)
**Note De-Identified  Obfuscation** Your physician has recommended you make the following change in your medication: stop taking HCTZ and start taking Furosemide (Lasix) 20 mg daily and Potassium 10 meq. Daily  Your physician recommends that you return for lab work in: 2 weeks and in 6 months  Your physician recommends that you schedule a follow-up appointment in: 6 months

## 2011-10-29 NOTE — Assessment & Plan Note (Signed)
Continue medical therapy and observation. He has preferred a conservative approach was no followup testing.

## 2011-10-29 NOTE — Assessment & Plan Note (Signed)
Blood pressure is up. We did discuss further medication adjustments. Depending on how he does with switch from HCTZ to Lasix, we might further advance either Plendil or Benicar.

## 2011-10-29 NOTE — Assessment & Plan Note (Signed)
Change from HCTZ to Lasix 20 mg daily with potassium supplement. Followup BMET in 2 weeks.

## 2011-10-29 NOTE — Assessment & Plan Note (Signed)
Recent LDL well controlled.

## 2011-10-29 NOTE — Progress Notes (Signed)
Clinical Summary [name] is a 78 y.o.male presenting for followup. He was seen in September 2012. Reports no recurring chest pain or progressive shortness of breath. Uses a cane, limited by his extensive rheumatoid arthritis. He does report problems with leg edema, increased urination in the evenings. He has been compliant with his medications including HCTZ.  Labwork from October 2012 showed cholesterol 150, triglycerides 125, HDL 55, LDL 70, AST 25, and ALT 23. We discussed these today.  He continues to prefer observation without followup ischemic or structural cardiac testing.  No Known Allergies  Current Outpatient Prescriptions  Medication Sig Dispense Refill  . albuterol (PROVENTIL HFA;VENTOLIN HFA) 108 (90 BASE) MCG/ACT inhaler Inhale 2 puffs into the lungs every 4 (four) hours as needed for wheezing or shortness of breath.  1 Inhaler  0  . aspirin 325 MG tablet Take 325 mg by mouth daily.      Marland Kitchen atenolol (TENORMIN) 25 MG tablet Take 25 mg by mouth daily.        . cholecalciferol (VITAMIN D) 1000 UNITS tablet Take 1,000 Units by mouth daily.        Marland Kitchen ezetimibe (ZETIA) 10 MG tablet Take 10 mg by mouth daily.        . felodipine (PLENDIL) 5 MG 24 hr tablet Take 5 mg by mouth daily.        . nitroGLYCERIN (NITROSTAT) 0.4 MG SL tablet Place 1 tablet (0.4 mg total) under the tongue every 5 (five) minutes as needed for chest pain.  25 tablet  3  . simvastatin (ZOCOR) 20 MG tablet Take 20 mg by mouth at bedtime.        . Thiamine HCl (VITAMIN B-1) 250 MG tablet Take 250 mg by mouth daily.        . valsartan (DIOVAN) 80 MG tablet Take 80 mg by mouth daily.        . furosemide (LASIX) 20 MG tablet Take 1 tablet (20 mg total) by mouth daily.  30 tablet  6  . potassium chloride (MICRO-K) 10 MEQ CR capsule Take 1 capsule (10 mEq total) by mouth daily.  30 capsule  6    Past Medical History  Diagnosis Date  . Coronary atherosclerosis of native coronary artery     Multivessel  .  Essential hypertension, benign   . Mixed hyperlipidemia   . Erectile dysfunction   . Osteoarthritis   . Myocardial infarction     IMI 1987 and PLMI 1997  . Rheumatoid aortitis     Past Surgical History  Procedure Date  . Coronary artery bypass graft 1987  . Carpal tunnel release   . Rotator cuff repair   . Neck surgery     Family History  Problem Relation Age of Onset  . Coronary artery disease Father     Died age 88 with MI    Social History [name] reports that he has quit smoking. His smoking use included Cigarettes. He has never used smokeless tobacco. [name] reports that he drinks alcohol.  Review of Systems No palpitations, falls, syncope. Stable appetite. No reported bleeding problems.  Physical Examination Filed Vitals:   10/29/11 1513  BP: 165/73  Pulse: 48  Resp: 18   Chronically ill appearing male in no acute distress.  HEENT: Conjuctivae normal, oropharynx with poor dentition.  Neck: No bruits, no thyromegaly.  Lungs: Decreased breath sound without wheezes.  Cardiac: Regular rate and rhythm with soft basal systolic murmur.  Abdomen: Soft, NABS.  Skin: Warm and dry.  Extremities: Arthritic deformities, 1-2+ edema below the knees.  Neuropsychiatric: Alert and oriented x 3, affect appropriate.     Problem List and Plan

## 2011-11-14 ENCOUNTER — Other Ambulatory Visit: Payer: Self-pay | Admitting: Cardiology

## 2011-11-14 LAB — BASIC METABOLIC PANEL
CO2: 25 mEq/L (ref 19–32)
Glucose, Bld: 95 mg/dL (ref 70–99)
Potassium: 4.6 mEq/L (ref 3.5–5.3)
Sodium: 137 mEq/L (ref 135–145)

## 2012-02-03 ENCOUNTER — Encounter (HOSPITAL_COMMUNITY): Payer: Self-pay | Admitting: *Deleted

## 2012-02-03 ENCOUNTER — Emergency Department (HOSPITAL_COMMUNITY)
Admission: EM | Admit: 2012-02-03 | Discharge: 2012-02-03 | Disposition: A | Discharge status: Home / Self Care | Payer: Medicare Other | Attending: Emergency Medicine | Admitting: Emergency Medicine

## 2012-02-03 ENCOUNTER — Emergency Department (HOSPITAL_COMMUNITY): Payer: Medicare Other

## 2012-02-03 ENCOUNTER — Observation Stay (HOSPITAL_COMMUNITY)
Admission: EM | Admit: 2012-02-03 | Discharge: 2012-02-05 | Disposition: A | Discharge status: Home Health | Payer: Medicare Other | Attending: Internal Medicine | Admitting: Internal Medicine

## 2012-02-03 DIAGNOSIS — R443 Hallucinations, unspecified: Secondary | ICD-10-CM | POA: Insufficient documentation

## 2012-02-03 DIAGNOSIS — E860 Dehydration: Secondary | ICD-10-CM | POA: Insufficient documentation

## 2012-02-03 DIAGNOSIS — H5316 Psychophysical visual disturbances: Secondary | ICD-10-CM | POA: Insufficient documentation

## 2012-02-03 DIAGNOSIS — E785 Hyperlipidemia, unspecified: Secondary | ICD-10-CM | POA: Insufficient documentation

## 2012-02-03 DIAGNOSIS — R600 Localized edema: Secondary | ICD-10-CM

## 2012-02-03 DIAGNOSIS — F23 Brief psychotic disorder: Secondary | ICD-10-CM | POA: Diagnosis present

## 2012-02-03 DIAGNOSIS — I252 Old myocardial infarction: Secondary | ICD-10-CM | POA: Insufficient documentation

## 2012-02-03 DIAGNOSIS — I10 Essential (primary) hypertension: Secondary | ICD-10-CM | POA: Insufficient documentation

## 2012-02-03 DIAGNOSIS — E782 Mixed hyperlipidemia: Secondary | ICD-10-CM | POA: Diagnosis present

## 2012-02-03 DIAGNOSIS — I25810 Atherosclerosis of coronary artery bypass graft(s) without angina pectoris: Secondary | ICD-10-CM | POA: Insufficient documentation

## 2012-02-03 DIAGNOSIS — R4182 Altered mental status, unspecified: Secondary | ICD-10-CM | POA: Insufficient documentation

## 2012-02-03 DIAGNOSIS — M1990 Unspecified osteoarthritis, unspecified site: Secondary | ICD-10-CM

## 2012-02-03 DIAGNOSIS — Z951 Presence of aortocoronary bypass graft: Secondary | ICD-10-CM | POA: Insufficient documentation

## 2012-02-03 DIAGNOSIS — J449 Chronic obstructive pulmonary disease, unspecified: Secondary | ICD-10-CM | POA: Insufficient documentation

## 2012-02-03 DIAGNOSIS — J4489 Other specified chronic obstructive pulmonary disease: Secondary | ICD-10-CM | POA: Insufficient documentation

## 2012-02-03 DIAGNOSIS — Z7982 Long term (current) use of aspirin: Secondary | ICD-10-CM | POA: Insufficient documentation

## 2012-02-03 DIAGNOSIS — Z79899 Other long term (current) drug therapy: Secondary | ICD-10-CM | POA: Insufficient documentation

## 2012-02-03 DIAGNOSIS — R441 Visual hallucinations: Secondary | ICD-10-CM

## 2012-02-03 DIAGNOSIS — I2510 Atherosclerotic heart disease of native coronary artery without angina pectoris: Secondary | ICD-10-CM | POA: Insufficient documentation

## 2012-02-03 DIAGNOSIS — F323 Major depressive disorder, single episode, severe with psychotic features: Principal | ICD-10-CM | POA: Insufficient documentation

## 2012-02-03 DIAGNOSIS — M069 Rheumatoid arthritis, unspecified: Secondary | ICD-10-CM

## 2012-02-03 DIAGNOSIS — N419 Inflammatory disease of prostate, unspecified: Secondary | ICD-10-CM | POA: Insufficient documentation

## 2012-02-03 HISTORY — DX: Rheumatoid arthritis, unspecified: M06.9

## 2012-02-03 LAB — URINALYSIS, ROUTINE W REFLEX MICROSCOPIC
Bilirubin Urine: NEGATIVE
Glucose, UA: NEGATIVE mg/dL
Ketones, ur: NEGATIVE mg/dL
pH: 7 (ref 5.0–8.0)

## 2012-02-03 LAB — URINE MICROSCOPIC-ADD ON

## 2012-02-03 LAB — CBC WITH DIFFERENTIAL/PLATELET
Basophils Relative: 0 % (ref 0–1)
HCT: 47.7 % (ref 39.0–52.0)
Hemoglobin: 16 g/dL (ref 13.0–17.0)
Lymphocytes Relative: 8 % — ABNORMAL LOW (ref 12–46)
Lymphs Abs: 1 10*3/uL (ref 0.7–4.0)
MCHC: 33.5 g/dL (ref 30.0–36.0)
Monocytes Absolute: 1.2 10*3/uL — ABNORMAL HIGH (ref 0.1–1.0)
Monocytes Relative: 10 % (ref 3–12)
Neutro Abs: 9.4 10*3/uL — ABNORMAL HIGH (ref 1.7–7.7)
Neutrophils Relative %: 81 % — ABNORMAL HIGH (ref 43–77)
RBC: 5.47 MIL/uL (ref 4.22–5.81)
WBC: 11.5 10*3/uL — ABNORMAL HIGH (ref 4.0–10.5)

## 2012-02-03 LAB — COMPREHENSIVE METABOLIC PANEL
Albumin: 4.6 g/dL (ref 3.5–5.2)
Alkaline Phosphatase: 66 U/L (ref 39–117)
BUN: 12 mg/dL (ref 6–23)
CO2: 25 mEq/L (ref 19–32)
Chloride: 96 mEq/L (ref 96–112)
GFR calc non Af Amer: 79 mL/min — ABNORMAL LOW (ref >90)
Potassium: 3.7 mEq/L (ref 3.5–5.1)
Total Bilirubin: 0.5 mg/dL (ref 0.3–1.2)

## 2012-02-03 MED ORDER — SULFAMETHOXAZOLE-TMP DS 800-160 MG PO TABS
1.0000 | ORAL_TABLET | Freq: Once | ORAL | Status: AC
  Administered 2012-02-03: 1 via ORAL
  Filled 2012-02-03: qty 1

## 2012-02-03 MED ORDER — SULFAMETHOXAZOLE-TRIMETHOPRIM 800-160 MG PO TABS: 1.0000 | ORAL_TABLET | Freq: Two times a day (BID) | ORAL | Status: DC

## 2012-02-03 NOTE — Discharge Instructions (Signed)
Continue the antibiotics.  Return if he gets a fever, vomiting or seems worse. Let Dr Silvana Newness office know to check his urine culture in 2-3 days.   Prostatitis Prostatitis is an inflammation (the body's way of reacting to injury and/or infection) of the prostate gland. The prostate gland is a male organ. The gland is about the size and shape of a walnut. The prostate is located just below the bladder. It produces semen, which is a fluid that helps nourish and transport sperm. Prostatitis is the most common urinary tract problem in men younger than age 30. There are 4 categories of prostatitis:  I - Acute bacterial prostatitis.   II - Chronic bacterial prostatitis.   III - Chronic prostatitis and chronic pelvic pain syndrome (CPPS).   Inflammatory.   Non inflammatory.   IV - Asymptomatic inflammatory prostatitis.  Acute and chronic bacterial prostatitis are problems with bacterial infections of the prostate. "Acute" infection is usually a one-time problem. "Chronic" bacterial prostatitis is a condition with recurrent infection. It is usually caused by the same germ(bacteria). CPPS has symptoms similar to prostate infection. However, no infection is actually found. This condition can cause problems of ongoing pain. Currently, it cannot be cured. Treatments are available and aimed at symptom control.  Asymptomatic inflammatory prostatitis has no symptoms. It is a condition where infection-fighting cells are found by chance in the urine. The diagnosis is made most often during an exam for other conditions. Other conditions could be infertility or a high level of PSA (prostate-specific antigen) in the blood. SYMPTOMS  Symptoms can vary depending upon the type of prostatitis that exists. There can also be overlap in symptoms. This can make diagnosis difficult. Symptoms: For Acute bacterial prostatitis  Painful urination.   Fever or chills.   Muscle or joint pains.   Low back pain.   Low  abdominal pain.   Inability to empty bladder completely.   Sudden urges to urinate.   Frequent urination during the day.   Difficulty starting urine stream.   Need to urinate several times at night (nocturia).   Weak urine stream.   Urethral (tube that carries urine from the bladder out of the body) discharge and dribbling after urination.  For Chronic bacterial prostatitis  Rectal pain.   Pain in the testicles, penis, or tip of the penis.   Pain in the space between the anus and scrotum (perineum).   Low back pain.   Low abdominal pain.   Problems with sexual function.   Painful ejaculation.   Bloody semen.   Inability to empty bladder completely.   Painful urination.   Sudden urges to urinate.   Frequent urination during the day.   Difficulty starting urine stream.   Need to urinate several times at night (nocturia).   Weak urine stream.   Dribbling after urination.   Urethral discharge.  For Asymptomatic inflammatory prostatitis As noted above, there are no symptoms with this condition. DIAGNOSIS   Sometimes blood work is performed. This is done to see if your white blood cell count is elevated. A urinalysis is done to find out what type of infection is present if this is a suspected cause. An additional urinalysis may be done after a digital rectal exam. This is to see if white blood cells are pushed out of the prostate and into the urine. A low-grade infection of the prostate may not be found on the first urinalysis.  TREATMENT  Antibiotics are used to treat infections caused by  germs. If the infection is not treated and becomes long lasting (chronic), it may become a lower grade infection with minor, continual problems. Without treatment, the prostate may develop a boil or furuncle (abscess). This may require surgical treatment. For those with chronic prostatitis and CPPS, it is important to work closely with your primary caregiver and urologist. For  some, the medicines that are used to treat a non-cancerous, enlarged prostate (benign prostatic hypertrophy) may be helpful. Referrals to specialists other than urologists may be necessary. In rare cases when all treatments have been inadequate for pain control, an operation to remove the prostate may be recommended. This is very rare and before this is considered thorough discussion with your urologist is highly recommended.  In cases of secondary to chronic non-bacterial prostatitis, a good relationship with your urologist or primary caregiver is essential because it is often a recurrent prolonged condition that requires a good understanding of the causes and a commitment to therapy aimed at controlling your symptoms. HOME CARE INSTRUCTIONS   Hot sitz baths for 20 minutes, 4 times per day, may help relieve pain.   Non-prescription pain killers may be used as your caregiver recommends if you have no allergies to them. Some illnesses or conditions prevent use of non-prescription drugs. If unsure, check with your caregiver. Take all medications as directed. Take the antibiotics for the prescribed length of time, even if you are feeling better.  SEEK MEDICAL CARE IF:   You have any worsening of the symptoms that originally brought you to your caregiver.   You have an oral temperature above 102 F (38.9 C).   You experience any side effects from medications prescribed.  SEEK IMMEDIATE MEDICAL CARE IF:   You have an oral temperature above 102 F (38.9 C), not controlled by medicine.   You have pain not relieved with medications.   You develop nausea, vomiting, lightheadedness, or have a fainting episode.   You are unable to urinate.   You pass bloody urine or clots.  Document Released: 07/20/2000 Document Revised: 07/12/2011 Document Reviewed: 06/25/2011 [facility] Patient Information 2012 [place], [place].

## 2012-02-03 NOTE — ED Notes (Signed)
Pt here via EMS for psych evaluation. Pt woke up this morning seeing bugs all over the place. Pt spraying raid all over the house. Daughter wants pt evaluated for psych issues.

## 2012-02-03 NOTE — ED Notes (Signed)
28ml urine in bladder per bladder scanner.

## 2012-02-03 NOTE — ED Provider Notes (Cosign Needed)
History   This chart was scribed for Ward Givens, MD by Sofie Rower. The patient was seen in room APA16A/APA16A and the patient's care was started at 7:09 AM     CSN: [id number]  Arrival date & time 02/03/12  4098   First MD Initiated Contact with Patient 02/03/12 [phone]      Chief Complaint  Patient presents with  . Altered Mental Status  . Psychiatric Evaluation   Level 5 caveat for hallucinations   (Consider location/radiation/quality/duration/timing/severity/associated sxs/prior treatment) HPI  [name] is a 78 y.o. male who presents to the Emergency Department complaining of altered mental status onset today with associated symptoms of urinary problems (wetting the bed). The pt speaks of bugs, millions all over the walls in the [facility], at present. Pt relative informs the EDP that the pt woke her up this morning complaining of wanting to go home, however, the pt was at home. The pt claims he was arrested Friday night, 02/01/12. The pt has been spraying at home for bugs.  Pt denies vomiting, diarrhea, depression, loss of appetite, denies any changes in medications recently. Has never had this before.       Pt has a hx of pneumonia (December 2012), quit smoking in (December 2012), fall in 2009 where which he cut his left ear, skin cancer removal (Dr. Margo Aye, one week ago this Thursday)  PCP is Dr. Charm Barges.   Past Medical History  Diagnosis Date  . Coronary atherosclerosis of native coronary artery     Multivessel  . Essential hypertension, benign   . Mixed hyperlipidemia   . Erectile dysfunction   . Osteoarthritis   . Myocardial infarction     IMI 1987 and PLMI 1997  . Rheumatoid aortitis     Past Surgical History  Procedure Date  . Coronary artery bypass graft 1987  . Carpal tunnel release   . Rotator cuff repair   . Neck surgery     Family History  Problem Relation Age of Onset  . Coronary artery disease Father     Died age 60 with MI    History  Substance  Use Topics  . Smoking status: Former Smoker quit in December 2012    Types: Cigarettes  . Smokeless tobacco: Never Used  . Alcohol Use: rare     Regular use  ,It uses only yesterday and isk. Lives alone Widowed 1 is ayear   Review of Systems  All other systems reviewed and are negative.    10 Systems reviewed and all are negative for acute change except as noted in the HPI.    Allergies  Review of patient's allergies indicates no known allergies.  Home Medications   Current Outpatient Rx  Name Route Sig Dispense Refill  . ASPIRIN 325 MG PO TABS Oral Take 325 mg by mouth daily.    . ATENOLOL 25 MG PO TABS Oral Take 25 mg by mouth daily.      Marland Kitchen VITAMIN D 1000 UNITS PO TABS Oral Take 1,000 Units by mouth daily.      Marland Kitchen EZETIMIBE 10 MG PO TABS Oral Take 10 mg by mouth daily.      Marland Kitchen FELODIPINE ER 5 MG PO TB24 Oral Take 5 mg by mouth daily.      Marland Kitchen HYDROCHLOROTHIAZIDE 25 MG PO TABS Oral Take 25 mg by mouth daily.    Marland Kitchen SIMVASTATIN 20 MG PO TABS Oral Take 20 mg by mouth at bedtime.      Marland Kitchen  VITAMIN B-1 250 MG PO TABS Oral Take 250 mg by mouth daily.      Marland Kitchen VALSARTAN 80 MG PO TABS Oral Take 80 mg by mouth daily.      . ALBUTEROL SULFATE HFA 108 (90 BASE) MCG/ACT IN AERS Inhalation Inhale 2 puffs into the lungs every 4 (four) hours as needed for wheezing or shortness of breath. 1 Inhaler 0  . NITROGLYCERIN 0.4 MG SL SUBL Sublingual Place 1 tablet (0.4 mg total) under the tongue every 5 (five) minutes as needed for chest pain. 25 tablet 3    BP 148/64  Pulse 62  Temp 98.9 F (37.2 C) (Oral)  Resp 18  SpO2 93%  Vital signs normal    Physical Exam  Nursing note and vitals reviewed. Constitutional: He is oriented to person, place, and time. He appears well-developed and well-nourished.  Non-toxic appearance. He does not appear ill. No distress.  HENT:  Head: Normocephalic and atraumatic.  Right Ear: External ear normal.  Left Ear: External ear normal.  Nose: Nose normal. No  mucosal edema or rhinorrhea.  Mouth/Throat: Oropharynx is clear and moist and mucous membranes are normal. No dental abscesses or uvula swelling.  Eyes: Conjunctivae and EOM are normal. Pupils are equal, round, and reactive to light.  Neck: Normal range of motion and full passive range of motion without pain. Neck supple.  Cardiovascular: Normal rate, regular rhythm and normal heart sounds.  Exam reveals no gallop and no friction rub.   No murmur heard. Pulmonary/Chest: Effort normal and breath sounds normal. No respiratory distress. He has no wheezes. He has no rhonchi. He has no rales. He exhibits no tenderness and no crepitus.  Abdominal: Soft. Normal appearance and bowel sounds are normal. He exhibits no distension. There is no tenderness. There is no rebound and no guarding.  Musculoskeletal: Normal range of motion. He exhibits no edema and no tenderness.       Moves all extremities well. 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 He has normal strength. No cranial nerve deficit.       Pt is fully oriented.  Skin: Skin is warm, dry and intact. No rash noted. No erythema. No pallor.  Psychiatric: He has a normal mood and affect. His speech is normal and behavior is normal. His mood appears not anxious.       Slightly agitated, currently seeing "millions of bugs, don't you see them?"    ED Course  Procedures (including critical care time)   Medications  sulfamethoxazole-trimethoprim (SEPTRA DS) 800-160 MG per tablet (not administered)   Daughter is comfortable taking him home, he has two teenage grandsons staying with him over the summer  DIAGNOSTIC STUDIES: Oxygen Saturation is 93% on [place], low by my interpretation.    COORDINATION OF CARE:  7:20AM- EDP at bedside discusses treatment plan concerning urine sample, blood work.   8:57AM- EDP at bedside discuses laboratory results and discusses  continued treatment plan concerning cat scan, x-ray, and treatment of possible prostate infection.    Results for orders placed during the hospital encounter of 02/03/12  URINALYSIS, ROUTINE W REFLEX MICROSCOPIC      Component Value Range   Color, Urine YELLOW  YELLOW   APPearance CLEAR  CLEAR   Specific Gravity, Urine 1.010  1.005 - 1.030   pH 7.0  5.0 - 8.0   Glucose, UA NEGATIVE  NEGATIVE mg/dL   Hgb  urine dipstick SMALL (*) NEGATIVE   Bilirubin Urine NEGATIVE  NEGATIVE   Ketones, ur NEGATIVE  NEGATIVE mg/dL   Protein, ur 119 (*) NEGATIVE mg/dL   Urobilinogen, UA 0.2  0.0 - 1.0 mg/dL   Nitrite NEGATIVE  NEGATIVE   Leukocytes, UA NEGATIVE  NEGATIVE  CBC WITH DIFFERENTIAL      Component Value Range   WBC 11.5 (*) 4.0 - 10.5 K/uL   RBC 5.47  4.22 - 5.81 MIL/uL   Hemoglobin 16.0  13.0 - 17.0 g/dL   HCT 14.7  82.9 - 56.2 %   MCV 87.2  78.0 - 100.0 fL   MCH 29.3  26.0 - 34.0 pg   MCHC 33.5  30.0 - 36.0 g/dL   RDW 13.0  86.5 - 78.4 %   Platelets 189  150 - 400 K/uL   Neutrophils Relative 81 (*) 43 - 77 %   Neutro Abs 9.4 (*) 1.7 - 7.7 K/uL   Lymphocytes Relative 8 (*) 12 - 46 %   Lymphs Abs 1.0  0.7 - 4.0 K/uL   Monocytes Relative 10  3 - 12 %   Monocytes Absolute 1.2 (*) 0.1 - 1.0 K/uL   Eosinophils Relative 0  0 - 5 %   Eosinophils Absolute 0.0  0.0 - 0.7 K/uL   Basophils Relative 0  0 - 1 %   Basophils Absolute 0.0  0.0 - 0.1 K/uL  COMPREHENSIVE METABOLIC PANEL      Component Value Range   Sodium 134 (*) 135 - 145 mEq/L   Potassium 3.7  3.5 - 5.1 mEq/L   Chloride 96  96 - 112 mEq/L   CO2 25  19 - 32 mEq/L   Glucose, Bld 131 (*) 70 - 99 mg/dL   BUN 12  6 - 23 mg/dL   Creatinine, Ser 6.96  0.50 - 1.35 mg/dL   Calcium 29.5  8.4 - 28.4 mg/dL   Total Protein 8.0  6.0 - 8.3 g/dL   Albumin 4.6  3.5 - 5.2 g/dL   AST 21  0 - 37 U/L   ALT 17  0 - 53 U/L   Alkaline Phosphatase 66  39 - 117 U/L   Total Bilirubin 0.5  0.3 - 1.2 mg/dL   GFR calc non Af Amer 79 (*) >90 mL/min     GFR calc Af Amer >90  >90 mL/min  URINE MICROSCOPIC-ADD ON      Component Value Range   Squamous Epithelial / LPF RARE  RARE   WBC, UA 0-2  <3 WBC/hpf   RBC / HPF 3-6  <3 RBC/hpf   Bacteria, UA FEW (*) RARE   Laboratory interpretation all normal except mild leukocytosis, hyperglycemia, ? Mild UTI   Dg Chest 2 View  02/03/2012  *RADIOLOGY REPORT*  Clinical Data: Mental status change  CHEST - 2 VIEW  Comparison: 07/14/2011  Findings: Previous median sternotomy and CABG procedure.  No pleural effusion or edema.  No airspace consolidation identified.  There are coarsened interstitial markings identified bilaterally consistent with COPD.  IMPRESSION:  1.  No acute cardiopulmonary abnormalities.  Original Report Authenticated By: Rosealee Albee, M.D.   Ct Head Wo Contrast  02/03/2012  *RADIOLOGY REPORT*  Clinical Data: Altered mental status  CT HEAD WITHOUT CONTRAST  Technique:  Contiguous axial images were obtained from the base of the skull through the vertex without contrast.  Comparison: 01/21/2008  Findings: There is diffuse patchy low density throughout the subcortical and periventricular white matter  consistent with chronic small vessel ischemic change.  There is prominence of the sulci and ventricles consistent with brain atrophy.  There is no evidence for acute brain infarct, hemorrhage or mass.  The paranasal sinuses and mastoid air cells are clear.  The skull is intact.  IMPRESSION:  1.  Small vessel ischemic disease and brain atrophy. 2.  No acute intracranial abnormalities.  Original Report Authenticated By: Rosealee Albee, M.D.      1. Hallucinations, visual   2. Prostatitis     New Prescriptions   SULFAMETHOXAZOLE-TRIMETHOPRIM (SEPTRA DS) 800-160 MG PER TABLET    Take 1 tablet by mouth 2 (two) times daily.    Plan discharge  Devoria Albe, MD, FACEP   MDM   I personally performed the services described in this documentation, which was scribed in my presence. The  recorded information has been reviewed and considered.  Devoria Albe, MD, FACEP    Ward Givens, MD 02/03/12 [phone]

## 2012-02-03 NOTE — ED Notes (Signed)
Pt was seen here earlier today because he was seeing bugs. Daughter says pt has been seeing cats and dragons all day and running squirrles out of the house. Daughter states she took a shower and came out and her father had a screwdriver hitting it with a hammer into the floor saying there were frogs and crabs in the carpet.

## 2012-02-04 ENCOUNTER — Inpatient Hospital Stay (HOSPITAL_COMMUNITY): Payer: Medicare Other

## 2012-02-04 ENCOUNTER — Encounter (HOSPITAL_COMMUNITY): Payer: Self-pay | Admitting: Internal Medicine

## 2012-02-04 DIAGNOSIS — R443 Hallucinations, unspecified: Secondary | ICD-10-CM

## 2012-02-04 DIAGNOSIS — F29 Unspecified psychosis not due to a substance or known physiological condition: Secondary | ICD-10-CM

## 2012-02-04 DIAGNOSIS — M069 Rheumatoid arthritis, unspecified: Secondary | ICD-10-CM

## 2012-02-04 DIAGNOSIS — F05 Delirium due to known physiological condition: Secondary | ICD-10-CM

## 2012-02-04 DIAGNOSIS — E860 Dehydration: Secondary | ICD-10-CM

## 2012-02-04 DIAGNOSIS — M1990 Unspecified osteoarthritis, unspecified site: Secondary | ICD-10-CM | POA: Diagnosis present

## 2012-02-04 DIAGNOSIS — F23 Brief psychotic disorder: Secondary | ICD-10-CM | POA: Diagnosis present

## 2012-02-04 LAB — ETHANOL: Alcohol, Ethyl (B): <11 mg/dL (ref 0–11)

## 2012-02-04 LAB — CBC
HCT: 47 % (ref 39.0–52.0)
MCV: 87.2 fL (ref 78.0–100.0)
Platelets: 181 10*3/uL (ref 150–400)
RBC: 5.39 MIL/uL (ref 4.22–5.81)
RDW: 14.2 % (ref 11.5–15.5)
WBC: 8.6 10*3/uL (ref 4.0–10.5)

## 2012-02-04 LAB — RAPID URINE DRUG SCREEN, HOSP PERFORMED
Barbiturates: NOT DETECTED
Cocaine: NOT DETECTED

## 2012-02-04 LAB — GLUCOSE, CAPILLARY: Glucose-Capillary: 132 mg/dL — ABNORMAL HIGH (ref 70–99)

## 2012-02-04 LAB — TSH: TSH: 0.645 u[IU]/mL (ref 0.350–4.500)

## 2012-02-04 LAB — PROTIME-INR: INR: 1.04 (ref 0.00–1.49)

## 2012-02-04 LAB — BASIC METABOLIC PANEL
CO2: 25 mEq/L (ref 19–32)
Chloride: 99 mEq/L (ref 96–112)
Creatinine, Ser: 1.19 mg/dL (ref 0.50–1.35)
GFR calc Af Amer: 66 mL/min — ABNORMAL LOW (ref >90)
Sodium: 138 mEq/L (ref 135–145)

## 2012-02-04 MED ORDER — ONDANSETRON HCL 4 MG/2ML IJ SOLN: 4.0000 mg | Freq: Four times a day (QID) | INTRAMUSCULAR | Status: DC | PRN

## 2012-02-04 MED ORDER — HYDROMORPHONE HCL PF 1 MG/ML IJ SOLN
0.5000 mg | INTRAMUSCULAR | Status: DC | PRN
Start: 2012-02-04 — End: 2012-02-05

## 2012-02-04 MED ORDER — FLEET ENEMA 7-19 GM/118ML RE ENEM: 1.0000 | ENEMA | Freq: Once | RECTAL | Status: AC | PRN

## 2012-02-04 MED ORDER — GADOBENATE DIMEGLUMINE 529 MG/ML IV SOLN
10.0000 mL | Freq: Once | INTRAVENOUS | Status: AC | PRN
  Administered 2012-02-04: 10 mL via INTRAVENOUS

## 2012-02-04 MED ORDER — SODIUM CHLORIDE 0.9 % IV BOLUS (SEPSIS)
500.0000 mL | Freq: Once | INTRAVENOUS | Status: AC
  Administered 2012-02-04: 500 mL via INTRAVENOUS

## 2012-02-04 MED ORDER — ENOXAPARIN SODIUM 40 MG/0.4ML ~~LOC~~ SOLN
40.0000 mg | SUBCUTANEOUS | Status: DC
  Administered 2012-02-04 – 2012-02-05 (×2): 40 mg via SUBCUTANEOUS
  Filled 2012-02-04 (×2): qty 0.4

## 2012-02-04 MED ORDER — IRBESARTAN 75 MG PO TABS
75.0000 mg | ORAL_TABLET | Freq: Every day | ORAL | Status: DC
  Administered 2012-02-04 – 2012-02-05 (×2): 75 mg via ORAL
  Filled 2012-02-04 (×2): qty 1

## 2012-02-04 MED ORDER — BISACODYL 5 MG PO TBEC: 5.0000 mg | DELAYED_RELEASE_TABLET | Freq: Every day | ORAL | Status: DC | PRN

## 2012-02-04 MED ORDER — ATENOLOL 25 MG PO TABS
25.0000 mg | ORAL_TABLET | Freq: Every day | ORAL | Status: DC
  Administered 2012-02-04 – 2012-02-05 (×2): 25 mg via ORAL
  Filled 2012-02-04 (×2): qty 1

## 2012-02-04 MED ORDER — ACETAMINOPHEN 325 MG PO TABS: 650.0000 mg | ORAL_TABLET | ORAL | Status: DC | PRN

## 2012-02-04 MED ORDER — ONDANSETRON HCL 4 MG PO TABS: 4.0000 mg | ORAL_TABLET | Freq: Four times a day (QID) | ORAL | Status: DC | PRN

## 2012-02-04 MED ORDER — NITROGLYCERIN 0.4 MG SL SUBL: 0.4000 mg | SUBLINGUAL_TABLET | SUBLINGUAL | Status: DC | PRN

## 2012-02-04 MED ORDER — POTASSIUM CHLORIDE IN NACL 20-0.9 MEQ/L-% IV SOLN
INTRAVENOUS | Status: DC
  Administered 2012-02-04 – 2012-02-05 (×2): via INTRAVENOUS

## 2012-02-04 MED ORDER — ALBUTEROL SULFATE (5 MG/ML) 0.5% IN NEBU: 2.5000 mg | INHALATION_SOLUTION | Freq: Four times a day (QID) | RESPIRATORY_TRACT | Status: DC | PRN

## 2012-02-04 MED ORDER — ASPIRIN 325 MG PO TABS
325.0000 mg | ORAL_TABLET | Freq: Every day | ORAL | Status: DC
  Administered 2012-02-04 – 2012-02-05 (×2): 325 mg via ORAL
  Filled 2012-02-04 (×2): qty 1

## 2012-02-04 MED ORDER — EZETIMIBE 10 MG PO TABS
10.0000 mg | ORAL_TABLET | Freq: Every day | ORAL | Status: DC
  Administered 2012-02-04 – 2012-02-05 (×2): 10 mg via ORAL
  Filled 2012-02-04 (×2): qty 1

## 2012-02-04 MED ORDER — TRAZODONE HCL 50 MG PO TABS: 25.0000 mg | ORAL_TABLET | Freq: Every evening | ORAL | Status: DC | PRN

## 2012-02-04 MED ORDER — HALOPERIDOL LACTATE 5 MG/ML IJ SOLN
5.0000 mg | Freq: Once | INTRAMUSCULAR | Status: AC
  Administered 2012-02-04: 5 mg via INTRAMUSCULAR
  Filled 2012-02-04: qty 1

## 2012-02-04 MED ORDER — SODIUM CHLORIDE 0.9 % IV SOLN
INTRAVENOUS | Status: DC
  Filled 2012-02-04 (×2): qty 1000

## 2012-02-04 MED ORDER — HALOPERIDOL LACTATE 5 MG/ML IJ SOLN: 2.5000 mg | Freq: Four times a day (QID) | INTRAMUSCULAR | Status: DC | PRN

## 2012-02-04 MED ORDER — VITAMIN B-1 100 MG PO TABS
250.0000 mg | ORAL_TABLET | Freq: Every day | ORAL | Status: DC
Start: 2012-02-04 — End: 2012-02-05
  Administered 2012-02-04 – 2012-02-05 (×2): 250 mg via ORAL
  Filled 2012-02-04: qty 2
  Filled 2012-02-04 (×2): qty 3

## 2012-02-04 MED ORDER — ACETAMINOPHEN 650 MG RE SUPP: 650.0000 mg | Freq: Four times a day (QID) | RECTAL | Status: DC | PRN

## 2012-02-04 MED ORDER — FELODIPINE ER 5 MG PO TB24
5.0000 mg | ORAL_TABLET | Freq: Every day | ORAL | Status: DC
  Administered 2012-02-04 – 2012-02-05 (×2): 5 mg via ORAL
  Filled 2012-02-04 (×2): qty 1

## 2012-02-04 MED ORDER — SIMVASTATIN 20 MG PO TABS
20.0000 mg | ORAL_TABLET | Freq: Every day | ORAL | Status: DC
  Administered 2012-02-04: 20 mg via ORAL
  Filled 2012-02-04: qty 1

## 2012-02-04 MED ORDER — OXYCODONE HCL 5 MG PO TABS: 5.0000 mg | ORAL_TABLET | ORAL | Status: DC | PRN

## 2012-02-04 MED ORDER — OCUVITE-LUTEIN PO CAPS
1.0000 | ORAL_CAPSULE | Freq: Every day | ORAL | Status: DC
  Administered 2012-02-04 – 2012-02-05 (×2): 1 via ORAL
  Filled 2012-02-04 (×2): qty 1

## 2012-02-04 MED ORDER — SENNA 8.6 MG PO TABS
1.0000 | ORAL_TABLET | Freq: Two times a day (BID) | ORAL | Status: DC
  Administered 2012-02-04 – 2012-02-05 (×4): 8.6 mg via ORAL
  Filled 2012-02-04 (×4): qty 1

## 2012-02-04 NOTE — Progress Notes (Signed)
UR Chart Review Completed  

## 2012-02-04 NOTE — Progress Notes (Signed)
Tele-psych consult finished.  Paged MD with results.

## 2012-02-04 NOTE — Progress Notes (Signed)
Chart reviewed. Discussed with nursing staff. Discussed with patient's daughter. MRI shows nothing acute. Urinalysis is actually quite unimpressive. Patient is still having visual hallucinations involving bugs. Patient has no history of drinking or psychosis. Does not take benzodiazepines. Nothing medical really to explain patient's psychosis. The patient's daughter flatly refuses inpatient psychiatry referral, but is open to telepsychiatry consultation. Hopefully, patient can have an antipsychotic recommended for home use and followup closely with outpatient psychiatry. Patient is not currently combative, suicidal or homicidal. Will discuss with social work.

## 2012-02-04 NOTE — Progress Notes (Signed)
Returned call to tele-psych consult.  Message left for Sela Hua with tele-psych at [phone].  Equipment ready and in room.  Paperwork complete.

## 2012-02-04 NOTE — H&P (Signed)
PCP:   Samuel Jester, DO   Chief Complaint:  Acute behavioral change x 1 day  HPI: [name] is an 78 y.o. male.   Elderly Caucasian gentleman with several chronic medical issues, including multivessel coronary artery disease declining intervention,  hypertension,  rheumatoid and osteoarthritis.   [name] lives alone, but currently has grandchildren staying with him. His daughter brought him to the emergency room this morning complaining of the hadn't slept for 2 days and he was hallucinating and acting strangely. He seemed to be seeing insects and abnormal objects on other people, seeing animals running around the house, and he also seem to be picking invisible things off his own body. Early this morning by report he was complaining that somebody was planning to kill him. He was brought to the emergency room this morning and evaluated no acute abnormalities were found, it was felt she maybe having a urinary tract infection and he was discharged home.  His daughter who reports she is EMT trained, and currently works as a Agricultural engineer at [employer] center, brought him back to the emergency room tonight because she feels he is getting worse and is worried he is getting septic from urinary tract infection. Patient has been reviewed by the night emergency room physician, behavioral health service was called for evaluation. However, daughter declined behavioral health evaluation at this time since she feels there is something medically wrong with her father, and the behavioral health service therefore decleared the patient not yet medically cleared for behavioral health treatment.  maintained on oxycodone and acetaminophen, taking multiple doses of 20 mg oxycodone each according to his daughter. He also takes 50 mg of Benadryl at bedtime to help with sleep.  There is no history of headache, fever, cough, cold, nausea, vomiting, diarrhea, or gait abnormality. It's unclear if there's been any  weight loss but daughter is concerned because he has not slept for 2 days and does not appear to have been eating or drinking. There is no history of new medications.  Patient is too confused to give his own history he is unable to state the reason why he is in hospital  Rewiew of Systems:  The patient denies anorexia, fever, weight loss,, vision loss, decreased hearing, hoarseness, chest pain, syncope, dyspnea on exertion, peripheral edema, balance deficits, hemoptysis, abdominal pain, melena, hematochezia, severe indigestion/heartburn, hematuria, incontinence, genital sores, muscle weakness, suspicious skin lesions, transient blindness, difficulty walking, depression, unusual weight change, abnormal bleeding, enlarged lymph nodes, angioedema, and breast masses.    Past Medical History  Diagnosis Date  . Coronary atherosclerosis of native coronary artery     Multivessel  . Essential hypertension, benign   . Mixed hyperlipidemia   . Erectile dysfunction   . Osteoarthritis   . Myocardial infarction     IMI 1987 and PLMI 1997  . Rheumatoid arthritis, adult     Past Surgical History  Procedure Date  . Coronary artery bypass graft 1987  . Carpal tunnel release   . Rotator cuff repair   . Neck surgery     Medications: This medication list was presented by his daughter, but this starts slightly different from the medication noted by his cardiologist 3 months. The medication list also does not include his Endocet 10/325   HOME MEDS: Prior to Admission medications   Medication Sig Start Date End Date Taking? Authorizing Provider  albuterol (PROVENTIL HFA;VENTOLIN HFA) 108 (90 BASE) MCG/ACT inhaler Inhale 2 puffs into the lungs every 4 (four)  hours as needed for wheezing or shortness of breath. 07/14/11 07/13/12  Felisa Bonier, MD  aspirin 325 MG tablet Take 325 mg by mouth daily.    Historical Provider, MD  atenolol (TENORMIN) 25 MG tablet Take 25 mg by mouth daily.     Historical  Provider, MD  cholecalciferol (VITAMIN D) 1000 UNITS tablet Take 1,000 Units by mouth daily.      Historical Provider, MD  diphenhydrAMINE (BENADRYL ALLERGY) 25 MG tablet Take 75 mg by mouth at bedtime.    Historical Provider, MD  ezetimibe (ZETIA) 10 MG tablet Take 10 mg by mouth daily.      Historical Provider, MD  felodipine (PLENDIL) 5 MG 24 hr tablet Take 5 mg by mouth daily.     Historical Provider, MD  hydrochlorothiazide (HYDRODIURIL) 25 MG tablet Take 25 mg by mouth daily.    Historical Provider, MD  Multiple Vitamins-Minerals (PRESERVISION AREDS) CAPS Take 1 capsule by mouth daily.    Historical Provider, MD  nitroGLYCERIN (NITROSTAT) 0.4 MG SL tablet Place 1 tablet (0.4 mg total) under the tongue every 5 (five) minutes as needed for chest pain. 07/16/11 07/15/12  Jonelle Sidle, MD  simvastatin (ZOCOR) 20 MG tablet Take 20 mg by mouth at bedtime.     Historical Provider, MD  sulfamethoxazole-trimethoprim (SEPTRA DS) 800-160 MG per tablet Take 1 tablet by mouth 2 (two) times daily. 02/03/12 02/13/12  Ward Givens, MD  Thiamine HCl (VITAMIN B-1) 250 MG tablet Take 250 mg by mouth daily.      Historical Provider, MD  valsartan (DIOVAN) 80 MG tablet Take 80 mg by mouth daily.      Historical Provider, MD       Allergies:  No Known Allergies  Social History:   reports that he quit smoking about 6 months ago. His smoking use included Cigarettes. He has a 60 pack-year smoking history. He has never used smokeless tobacco. He reports that he does not drink alcohol or use illicit drugs.  Family History: Family History  Problem Relation Age of Onset  . Coronary artery disease Father     Died age 55 with MI     Physical Exam: Filed Vitals:   02/03/12 2248 02/04/12 0227 02/04/12 0336  BP: 148/118 161/93 113/80  Pulse: 56 72 64  Temp: 98.1 F (36.7 C) 98.3 F (36.8 C) 97.9 F (36.6 C)  TempSrc: Oral Oral Oral  Resp: 18 20 17   Height: 5\' 3"  (1.6 m)  5\' 6"  (1.676 m)  Weight:  54.432 kg (120 lb)  54.1 kg (119 lb 4.3 oz)  SpO2: 94% 96% 94%   Blood pressure 113/80, pulse 64, temperature 97.9 F (36.6 C), temperature source Oral, resp. rate 17, height 5\' 6"  (1.676 m), weight 54.1 kg (119 lb 4.3 oz), SpO2 94.00%.  GEN: Confused small frame elderly Caucasian man l sitting up in the stretcher in no acute distress; cooperative with exam PSYCH:  alert and oriented x1;  HEENT: Mucous membranes pink, dry and anicteric; PERRLA; EOM intact; no cervical lymphadenopathy nor thyromegaly or carotid bruit; no JVD; artificial dentures upper and lower Breasts:: Not examined CHEST WALL: No tenderness CHEST: No respiratory distress, but poor respiratory movement; diminished breath sounds throughout HEART: Regular rate and rhythm; no murmurs rubs or gallops BACK: Mild scoliosis; no CVA tenderness ABDOMEN:  soft non-tender; no masses, no organomegaly, normal abdominal bowel sounds; no pannus; no intertriginous candida. Rectal Exam: Not done EXTREMITIES: Arthritic deformities of both hands;age-appropriate arthropathy of the  hands and knees; no edema; no ulcerations.; Post left knee status post recent fall Genitalia: Normal uncircumcised penis, no discharge, no inflammation, no swelling PULSES: 2+ and symmetric SKIN: Normal hydration no rash or ulceration CNS: Cranial nerves 2-12 grossly intact no focal lateralizing neurologic deficit   Labs & Imaging Results for orders placed during the hospital encounter of 02/03/12 (from the past 48 hour(s))  ETHANOL     Status: Normal   Collection Time   02/03/12 11:52 PM      Component Value Range Comment   Alcohol, Ethyl (B) <11  0 - 11 mg/dL   URINE RAPID DRUG SCREEN (HOSP PERFORMED)     Status: Normal   Collection Time   02/04/12 12:06 AM      Component Value Range Comment   Opiates NONE DETECTED  NONE DETECTED    Cocaine NONE DETECTED  NONE DETECTED    Benzodiazepines NONE DETECTED  NONE DETECTED    Amphetamines NONE DETECTED  NONE  DETECTED    Tetrahydrocannabinol NONE DETECTED  NONE DETECTED    Barbiturates NONE DETECTED  NONE DETECTED    Dg Chest 2 View  02/03/2012  *RADIOLOGY REPORT*  Clinical Data: Mental status change  CHEST - 2 VIEW  Comparison: 07/14/2011  Findings: Previous median sternotomy and CABG procedure.  No pleural effusion or edema.  No airspace consolidation identified.  There are coarsened interstitial markings identified bilaterally consistent with COPD.  IMPRESSION:  1.  No acute cardiopulmonary abnormalities.  Original Report Authenticated By: Rosealee Albee, M.D.   Ct Head Wo Contrast  02/03/2012  *RADIOLOGY REPORT*  Clinical Data: Altered mental status  CT HEAD WITHOUT CONTRAST  Technique:  Contiguous axial images were obtained from the base of the skull through the vertex without contrast.  Comparison: 01/21/2008  Findings: There is diffuse patchy low density throughout the subcortical and periventricular white matter consistent with chronic small vessel ischemic change.  There is prominence of the sulci and ventricles consistent with brain atrophy.  There is no evidence for acute brain infarct, hemorrhage or mass.  The paranasal sinuses and mastoid air cells are clear.  The skull is intact.  IMPRESSION:  1.  Small vessel ischemic disease and brain atrophy. 2.  No acute intracranial abnormalities.  Original Report Authenticated By: Rosealee Albee, M.D.      Assessment Present on Admission:  Marland KitchenMarland KitchenAcute psychosis Dehydration Coronary atherosclerosis of native coronary artery .Mixed hyperlipidemia .Essential hypertension, benign  .Rheumatoid arthritis, adult .Osteoarthritis   PLAN: Admit this gentleman on observation for medical evaluation of his acute psychosis; although this could be due to simple hydration and to be unusual to produce a severe psychotic changes; B12 deficiency is unlikely given his hematologic indices; tertiary syphillis is a possibility and we will start with an RPR, and HIV  screen. Also get MRI of the brain with and without contrast to rule out a brain tumor. He may benefit from a neurology consult.  If no acute medical problem is found, patient can be referred to the behavioral health team for reevaluation. It is also possible of course that this is a sudden worsening of vascular dementia, which her family may not have noticed  Other plans as per orders.    Jemery Stacey 02/04/2012, 5:39 AM

## 2012-02-04 NOTE — ED Notes (Signed)
Dr. Orvan Falconer in assessing patient at this time.

## 2012-02-04 NOTE — Evaluation (Signed)
Physical Therapy Evaluation Patient Details Name: [name] MRN: [id number] DOB: 04-Nov-1932 Today's Date: 02/04/2012 Time: 1710-1735 PT Time Calculation (min): 25 min  PT Assessment / Plan / Recommendation Clinical Impression  Pt with mod I for mobility who will benefit from ambulation by nursing staff but does not need skilled physical therapy.    PT Assessment  Patent does not need any further PT services    Follow Up Recommendations    none   Barriers to Discharge  none      Equipment Recommendations  None recommended by PT    Recommendations for Other Services     Frequency      Precautions / Restrictions Precautions Precautions: Fall       Mobility  Bed Mobility Bed Mobility: Supine to Sit Supine to Sit: 6: Modified independent (Device/Increase time) Transfers Sit to Stand: 4: Min assist Stand to Sit: 5: Supervision Ambulation/Gait Ambulation/Gait Assistance: 6: Modified independent (Device/Increase time) Ambulation Distance (Feet): 150 Feet Assistive device: Rolling walker Gait Pattern: Within Functional Limits Stairs: No Wheelchair Mobility Wheelchair Mobility: No         PT Diagnosis:   NO PT needs PT Problem List:   PT Treatment Interventions:     PT Goals    Visit Information  Last PT Received On: 02/04/12 Assistance Needed: +1    Subjective Data  Subjective: Pt states his R leg is bothering him the most but he has OA in both.   Pt states he uses a cane at home he has a walker at home  Patient Stated Goal: To go home   Prior Functioning  Home Living Lives With: Alone (Patient states that his grandson will stay with him.) Available Help at Discharge: Family;Available 24 hours/day Type of Home: House Home Access: Stairs to enter [employer] of Steps: 1 Entrance Stairs-Rails: None Home Layout: One level Bathroom Shower/Tub: Tub/shower unit;Curtain;Walk-in shower (Patient uses tub/shower primarily.) Bathroom Toilet:  Standard Bathroom Accessibility: Yes How Accessible: Accessible via walker Home Adaptive Equipment: Straight cane;Grab bars in shower;Shower chair with back Prior Function Level of Independence: Independent Able to Take Stairs?: Yes Driving: Yes (patient reports.) Vocation: Retired Musician: No difficulties    Cognition  Overall Cognitive Status: Impaired Area of Impairment: Awareness of deficits (unable to state reason for hospitalization. ) Arousal/Alertness: Awake/alert Orientation Level: Person;Place;Time Behavior During Session: WFL for tasks performed    Extremity/Trunk Assessment Right Lower Extremity Assessment RLE ROM/Strength/Tone: [facility] for tasks assessed Left Lower Extremity Assessment LLE ROM/Strength/Tone: WFL for tasks assessed   Balance Balance Balance Assessed: Yes Dynamic Standing Balance Dynamic Standing - Balance Support: No upper extremity supported;During functional activity Dynamic Standing - Level of Assistance: 4: Min assist Dynamic Standing - Balance Activities: Forward lean/weight shifting;Reaching for objects (washing hands at sink-standing.) High Level Balance High Level Balance Activites: Side stepping High Level Balance Comments: Side stepping to right and left at sink with Min Assist. Patient presents with delayed motor processing to initiate first step.  End of Session PT - End of Session Equipment Utilized During Treatment: Gait belt Activity Tolerance: Patient tolerated treatment well Patient left: in bed Nurse Communication: Mobility status  GP     Vinessa Macconnell,CINDY 02/04/2012, 5:31 PM

## 2012-02-04 NOTE — BH Assessment (Signed)
Assessment Note   [name] is an 78 y.o. male. PT WAS BROUGHT IN BY DAUGHTER AFTER PT HAD BEEN EXPRESSED THAT HE WAS SEEING THINGS BUT DENIES ANY MENTAL HEALTH TX OR DEPRESSION. PT DENIES ANY IDEATION. PER DAUGHTER, PT HAD BEEN DIAGNOSED WITH A UTI BUT WAS TOLD IF IT GOT WORSE TO BRING HIM IN FOR MORE MEDICAL TREATMENT. DAUGHTER DOES NOT BELIEVE PT TO BE MENTALLY ADMITTED BUT WANTS PT TO BE TREATED FOR HIS UTI & WORRIED IF HE WAS NOT TREATED HE COULD GO SCEPTIC. CLINICIAN EXPLAINED TO DAUGHTER THAT SHE WOULD RELAY INFORMATION TO DR. PT AT THIS TIME IS NOT MEDICALLY CLEARED & IS REFERRED TO BACK TO THE MEDICAL TEAM.  Axis I: Rule out Major Depression and NO MENTAL DIAGNOSES Axis II: Deferred Axis III:  Past Medical History  Diagnosis Date  . Coronary atherosclerosis of native coronary artery     Multivessel  . Essential hypertension, benign   . Mixed hyperlipidemia   . Erectile dysfunction   . Osteoarthritis   . Myocardial infarction     IMI 1987 and PLMI 1997  . Rheumatoid aortitis    Axis IV: problems with access to health care services Axis V: 61-70 mild symptoms  Past Medical History:  Past Medical History  Diagnosis Date  . Coronary atherosclerosis of native coronary artery     Multivessel  . Essential hypertension, benign   . Mixed hyperlipidemia   . Erectile dysfunction   . Osteoarthritis   . Myocardial infarction     IMI 1987 and PLMI 1997  . Rheumatoid aortitis     Past Surgical History  Procedure Date  . Coronary artery bypass graft 1987  . Carpal tunnel release   . Rotator cuff repair   . Neck surgery     Family History:  Family History  Problem Relation Age of Onset  . Coronary artery disease Father     Died age 56 with MI    Social History:  reports that he has quit smoking. His smoking use included Cigarettes. He has never used smokeless tobacco. He reports that he drinks alcohol. He reports that he does not use illicit drugs.  Additional Social  History:     CIWA: CIWA-Ar BP: 148/118 mmHg Pulse Rate: 56  COWS:    Allergies: No Known Allergies  Home Medications:  (Not in a hospital admission)  OB/GYN Status:  No LMP for male patient.  General Assessment Data Location of Assessment: [facility] ACT Assessment: Yes Living Arrangements: Children;Other relatives Can pt return to current living arrangement?: Yes Admission Status: Voluntary Is patient capable of signing voluntary admission?: Yes Transfer from: Acute Hospital Referral Source: MD     Risk to self Suicidal Ideation: No Suicidal Intent: No Is patient at risk for suicide?: No Suicidal Plan?: No Access to Means: No What has been your use of drugs/alcohol within the last 12 months?: NA Previous Attempts/Gestures: No How many times?: 0  Other Self Harm Risks: NA Triggers for Past Attempts: Other personal contacts Intentional Self Injurious Behavior: None Family Suicide History: No Recent stressful life event(s): Other (Comment) Persecutory voices/beliefs?: Yes Depression: No Depression Symptoms: Loss of interest in usual pleasures Substance abuse history and/or treatment for substance abuse?: No Suicide prevention information given to non-admitted patients: Not applicable  Risk to Others Homicidal Ideation: No Thoughts of Harm to Others: No Current Homicidal Intent: No Current Homicidal Plan: No Access to Homicidal Means: No Identified Victim: NA History of harm to others?: No  Assessment of Violence: None Noted Violent Behavior Description: NA Does patient have access to weapons?: No Criminal Charges Pending?: No Does patient have a court date: No  Psychosis Hallucinations: None noted Delusions: None noted  Mental Status Report Appear/Hygiene: Improved Eye Contact: Good Motor Activity: Freedom of movement Speech: Logical/coherent Level of Consciousness: Alert Mood: Anhedonia Affect: Appropriate to circumstance Anxiety Level: None Thought  Processes: Coherent;Relevant Judgement: Unimpaired Orientation: Person;Place;Time;Situation Obsessive Compulsive Thoughts/Behaviors: None  Cognitive Functioning Concentration: Decreased Memory: Recent Intact;Remote Intact IQ: Average Insight: Fair Impulse Control: Fair Appetite: Fair Weight Loss: 0  Weight Gain: 0  Sleep: No Change Total Hours of Sleep: 7  Vegetative Symptoms: None  ADLScreening [facility] Assessment Services) Patient's cognitive ability adequate to safely complete daily activities?: Yes Patient able to express need for assistance with ADLs?: Yes Independently performs ADLs?: Yes  Abuse/Neglect [facility]) Physical Abuse: Denies Verbal Abuse: Denies Sexual Abuse: Denies  Prior Inpatient Therapy Prior Inpatient Therapy: No Prior Therapy Dates: NA Prior Therapy Facilty/Provider(s): NA Reason for Treatment: NA  Prior Outpatient Therapy Prior Outpatient Therapy: No Prior Therapy Dates: NA Prior Therapy Facilty/Provider(s): NA Reason for Treatment: NA  ADL Screening (condition at time of admission) Patient's cognitive ability adequate to safely complete daily activities?: Yes Patient able to express need for assistance with ADLs?: Yes Independently performs ADLs?: Yes       Abuse/Neglect Assessment (Assessment to be complete while patient is alone) Physical Abuse: Denies Verbal Abuse: Denies Sexual Abuse: Denies Values / Beliefs Cultural Requests During Hospitalization: None Spiritual Requests During Hospitalization: None        Additional Information 1:1 In Past 12 Months?: No CIRT Risk: No Elopement Risk: No Does patient have medical clearance?: No     Disposition:  Disposition Disposition of Patient: Other dispositions (MEDICAL FOLLOW UP) Other disposition(s): Information only  On Site Evaluation by:   Reviewed with Physician:     Waldron Session 02/04/2012 12:22 AM

## 2012-02-04 NOTE — ED Provider Notes (Cosign Needed)
History     CSN: [id number]  Arrival date & time 02/03/12  2241   First MD Initiated Contact with Patient 02/03/12 2304      Chief Complaint  Patient presents with  . Altered Mental Status    (Consider location/radiation/quality/duration/timing/severity/associated sxs/prior treatment) HPI Level 5 caveat due to altered mental status [name] IS A 78 y.o. male brought in by daughter to the Emergency Department complaining of hallucinations. Patient was seen this morning in the ER and evaluated for hallucinations. Per daughter, at 5:30 AM he woke her at home c/o having been put in a killing room. He was seeing bugs. He was seeing cats and dragons and squirrels in the home. Labs were done which were unremarkable. Chest xray and CT were without acute findings. He was sent home with treatment for a possible UTI. This evening he has continued to hallucinate. He took a screwdriver and hammer trying to remove crabs and frogs from the carpet. Per the daughter he has not slept in two nights.There have been no changes in medicines, patient does not drink alcoholic beverages. He has no psychiatric history. He is currently having visual hallucinations, picking at the bed covers, asking for a stick to hit the bugs.  PCP Dr. Charm Barges   Past Medical History  Diagnosis Date  . Coronary atherosclerosis of native coronary artery     Multivessel  . Essential hypertension, benign   . Mixed hyperlipidemia   . Erectile dysfunction   . Osteoarthritis   . Myocardial infarction     IMI 1987 and PLMI 1997  . Rheumatoid aortitis     Past Surgical History  Procedure Date  . Coronary artery bypass graft 1987  . Carpal tunnel release   . Rotator cuff repair   . Neck surgery     Family History  Problem Relation Age of Onset  . Coronary artery disease Father     Died age 43 with MI    History  Substance Use Topics  . Smoking status: Former Smoker    Types: Cigarettes  . Smokeless tobacco: Never  Used  . Alcohol Use: Yes     Regular use      Review of Systems  Unable to perform ROS: Mental status change    Allergies  Review of patient's allergies indicates no known allergies.  Home Medications   Current Outpatient Rx  Name Route Sig Dispense Refill  . ALBUTEROL SULFATE HFA 108 (90 BASE) MCG/ACT IN AERS Inhalation Inhale 2 puffs into the lungs every 4 (four) hours as needed for wheezing or shortness of breath. 1 Inhaler 0  . ASPIRIN 325 MG PO TABS Oral Take 325 mg by mouth daily.    . ATENOLOL 25 MG PO TABS Oral Take 25 mg by mouth daily.     Marland Kitchen VITAMIN D 1000 UNITS PO TABS Oral Take 1,000 Units by mouth daily.      Marland Kitchen DIPHENHYDRAMINE HCL 25 MG PO TABS Oral Take 75 mg by mouth at bedtime.    Marland Kitchen EZETIMIBE 10 MG PO TABS Oral Take 10 mg by mouth daily.      Marland Kitchen FELODIPINE ER 5 MG PO TB24 Oral Take 5 mg by mouth daily.     Marland Kitchen HYDROCHLOROTHIAZIDE 25 MG PO TABS Oral Take 25 mg by mouth daily.    Marland Kitchen PRESERVISION AREDS PO CAPS Oral Take 1 capsule by mouth daily.    Marland Kitchen NITROGLYCERIN 0.4 MG SL SUBL Sublingual Place 1 tablet (0.4 mg total) under  the tongue every 5 (five) minutes as needed for chest pain. 25 tablet 3  . SIMVASTATIN 20 MG PO TABS Oral Take 20 mg by mouth at bedtime.     . SULFAMETHOXAZOLE-TRIMETHOPRIM 800-160 MG PO TABS Oral Take 1 tablet by mouth 2 (two) times daily. 28 tablet 0  . VITAMIN B-1 250 MG PO TABS Oral Take 250 mg by mouth daily.      Marland Kitchen VALSARTAN 80 MG PO TABS Oral Take 80 mg by mouth daily.        BP 148/118  Pulse 56  Temp 98.1 F (36.7 C) (Oral)  Resp 18  Ht 5\' 3"  (1.6 m)  Wt 120 lb (54.432 kg)  BMI 21.26 kg/m2  SpO2 94%  Physical Exam  Nursing note and vitals reviewed. Constitutional:       Awake, alert, nontoxic appearance.  HENT:  Head: Normocephalic and atraumatic.  Right Ear: External ear normal.  Left Ear: External ear normal.  Mouth/Throat: Oropharynx is clear and moist.  Eyes: Conjunctivae and EOM are normal. Pupils are equal, round, and  reactive to light. Right eye exhibits no discharge. Left eye exhibits no discharge.  Neck: Normal range of motion. Neck supple.  Cardiovascular: Normal rate and normal heart sounds.   Pulmonary/Chest: Effort normal and breath sounds normal. He exhibits no tenderness.  Abdominal: Soft. Bowel sounds are normal. There is no tenderness. There is no rebound.  Musculoskeletal: He exhibits no tenderness.       Baseline ROM, no obvious new focal weakness.RA changes to hands and feet. 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  Neurological:       Patient can answer some questions appropriately.  Skin: No rash noted.  Psychiatric: He has a normal mood and affect.       Patient is actively hallucinating. He will tell you what he is seeing if asked. When asked his name, he said he didn't want to talk about it. He recognizes and names both his daughter and grandson in the room. When the nurse asked him a question he responded that she should not talk with him or he would have to "knock the shit out of you". He is not combative. His demeanor is pleasant.    ED Course  Procedures (including critical care time)   Labs Reviewed  URINE RAPID DRUG SCREEN (HOSP PERFORMED)  ETHANOL   Dg Chest 2 View  02/03/2012  *RADIOLOGY REPORT*  Clinical Data: Mental status change  CHEST - 2 VIEW  Comparison: 07/14/2011  Findings: Previous median sternotomy and CABG procedure.  No pleural effusion or edema.  No airspace consolidation identified.  There are coarsened interstitial markings identified bilaterally consistent with COPD.  IMPRESSION:  1.  No acute cardiopulmonary abnormalities.  Original Report Authenticated By: Rosealee Albee, M.D.   Ct Head Wo Contrast  02/03/2012  *RADIOLOGY REPORT*  Clinical Data: Altered mental status  CT HEAD WITHOUT CONTRAST  Technique:  Contiguous axial images were obtained from the base of the skull through the vertex without contrast.  Comparison: 01/21/2008  Findings: There is  diffuse patchy low density throughout the subcortical and periventricular white matter consistent with chronic small vessel ischemic change.  There is prominence of the sulci and ventricles consistent with brain atrophy.  There is no evidence for acute brain infarct, hemorrhage or mass.  The paranasal sinuses and mastoid air cells are clear.  The skull is intact.  IMPRESSION:  1.  Small vessel ischemic disease and brain atrophy. 2.  No  acute intracranial abnormalities.  Original Report Authenticated By: Rosealee Albee, M.D.     No diagnosis found.  1:22 AM:  T/C to Dr. Orvan Falconer, hospitalist, case discussed, including:  HPI, pertinent PM/SHx, VS/PE, dx testing, ED course and treatment.  He will see the patient in the ER. 0157 Dr. Orvan Falconer advised he will admit the patient for observation. He asked that I admit to observation. MDM  Patient with onset of hallucinations and no know cause. Evaluated today with labs, chest xray and CT head without acute findings. Seen by behavioral health who were told by daughter she did not want him admitted to a psych facility. Dr. Orvan Falconer saw the patient in the ER and has agreed to admit for observation. Pt stable in ED with no significant deterioration in condition.The patient appears reasonably stabilized for admission considering the current resources, flow, and capabilities available in the ED at this time, and I doubt any other [facility] requiring further screening and/or treatment in the ED prior to admission.  MDM Reviewed: nursing note, vitals and previous chart Reviewed previous: labs, x-ray and CT scan Interpretation: labs Consults: ACT.         Nicoletta Dress. Colon Branch, MD 02/04/12 1610

## 2012-02-04 NOTE — ED Notes (Signed)
Dr. Orvan Falconer stated to order a one-on-one safety sitter.

## 2012-02-04 NOTE — Evaluation (Signed)
Occupational Therapy Evaluation and Discharge Patient Details Name: [name] MRN: [id number] DOB: 12-20-1932 Today's Date: 02/04/2012 Time: 4782-9562 OT Time Calculation (min): 16 min  OT Assessment / Plan / Recommendation Clinical Impression  Patient is a 78 y/o male s/p Acute Psychosis presenting to acute OT with all education completed. No further OT needs; will sign off.    OT Assessment  Patient does not need any further OT services    Follow Up Recommendations  No OT follow up       Equipment Recommendations  None recommended by OT    Recommendations for Other Services PT consult     Precautions / Restrictions Precautions Precautions: Fall   Pertinent Vitals/Pain No reports of pain.    ADL  Grooming: Performed;Wash/dry hands;Set up Where Assessed - Grooming: Supported standing Upper Body Bathing: Simulated;Supervision/safety Where Assessed - Upper Body Bathing: Supported sitting Lower Body Bathing: Simulated;Supervision/safety Where Assessed - Lower Body Bathing: Supported sit to stand Upper Body Dressing: Simulated;Supervision/safety Where Assessed - Upper Body Dressing: Supported sitting Lower Body Dressing: Simulated;Supervision/safety Where Assessed - Lower Body Dressing: Supported sit to Pharmacist, hospital: Performed;Minimal assistance (to recliner.) Toilet Transfer Method: Sit to Barista: Regular height toilet;Grab bars Toileting - Clothing Manipulation and Hygiene: Performed;Supervision/safety Where Assessed - Toileting Clothing Manipulation and Hygiene: Standing Transfers/Ambulation Related to ADLs: Patient transfers at [employer] level with hand held assist.       Visit Information  Last OT Received On: 02/04/12 Assistance Needed: +1    Subjective Data  Subjective: "I'm not confused." Patient Stated Goal: To go home.   Prior Functioning  Home Living Lives With: Alone (Patient states that his grandson will stay with  him.) Available Help at Discharge: Family;Available 24 hours/day Type of Home: House Home Access: Stairs to enter [employer] of Steps: 1 Entrance Stairs-Rails: None Home Layout: One level Bathroom Shower/Tub: Tub/shower unit;Curtain;Walk-in shower (Patient uses tub/shower primarily.) Bathroom Toilet: Standard Bathroom Accessibility: Yes How Accessible: Accessible via walker Home Adaptive Equipment: Straight cane;Grab bars in shower;Shower chair with back Prior Function Level of Independence: Independent Able to Take Stairs?: Yes Driving: Yes (patient reports.) Vocation: Retired Musician: No difficulties    Cognition  Overall Cognitive Status: Impaired Area of Impairment: Awareness of deficits (unable to state reason for hospitalization. ) Arousal/Alertness: Awake/alert Orientation Level: Person;Place;Time Behavior During Session: WFL for tasks performed       Mobility Bed Mobility Bed Mobility: Supine to Sit Supine to Sit: 6: Modified independent (Device/Increase time) Transfers Transfers: Sit to Stand;Stand to Sit Sit to Stand: 4: Min assist;From bed Stand to Sit: To chair/3-in-1;4: Min Conservation officer, nature Assessed: Yes Dynamic Standing Balance Dynamic Standing - Balance Support: No upper extremity supported;During functional activity Dynamic Standing - Level of Assistance: 4: Min assist Dynamic Standing - Balance Activities: Forward lean/weight shifting;Reaching for objects (washing hands at sink-standing.) High Level Balance High Level Balance Activites: Side stepping High Level Balance Comments: Side stepping to right and left at sink with Min Assist. Patient presents with delayed motor processing to initiate first step.  End of Session OT - End of Session Activity Tolerance: Patient tolerated treatment well Patient left: in chair;Other (comment) (with sitter)    Maurice Fotheringham, Charisse March, OTR/L 02/04/2012, 3:28 PM

## 2012-02-05 DIAGNOSIS — F323 Major depressive disorder, single episode, severe with psychotic features: Secondary | ICD-10-CM | POA: Diagnosis present

## 2012-02-05 DIAGNOSIS — I2510 Atherosclerotic heart disease of native coronary artery without angina pectoris: Secondary | ICD-10-CM

## 2012-02-05 DIAGNOSIS — I10 Essential (primary) hypertension: Secondary | ICD-10-CM

## 2012-02-05 LAB — URINE CULTURE
Colony Count: NO GROWTH
Culture: NO GROWTH
Special Requests: NORMAL

## 2012-02-05 MED ORDER — CITALOPRAM HYDROBROMIDE 10 MG PO TABS: 10.0000 mg | ORAL_TABLET | Freq: Every day | ORAL | Status: DC

## 2012-02-05 MED ORDER — RISPERIDONE 0.5 MG PO TABS: 0.5000 mg | ORAL_TABLET | Freq: Two times a day (BID) | ORAL | Status: DC

## 2012-02-05 NOTE — Discharge Summary (Signed)
Physician Discharge Summary  Patient ID: [name] MRN: [id number] DOB/AGE: 1932-08-07 78 y.o.  Admit date: 02/03/2012 Discharge date: 02/05/2012  Discharge Diagnoses:  Principal Problem:  *Major depressive disorder, single episode, severe, specified as with psychotic behavior Active Problems:  Essential hypertension, benign  Coronary atherosclerosis of native coronary artery  Mixed hyperlipidemia  Rheumatoid arthritis, adult  Osteoarthritis   Medication List  As of 02/05/2012  9:14 AM   TAKE these medications         albuterol 108 (90 BASE) MCG/ACT inhaler   Commonly known as: PROVENTIL HFA;VENTOLIN HFA   Inhale 2 puffs into the lungs every 4 (four) hours as needed for wheezing or shortness of breath.      aspirin 325 MG tablet   Take 325 mg by mouth daily.      atenolol 25 MG tablet   Commonly known as: TENORMIN   Take 25 mg by mouth daily.      BENADRYL ALLERGY 25 MG tablet   Generic drug: diphenhydrAMINE   Take 75 mg by mouth at bedtime.      cholecalciferol 1000 UNITS tablet   Commonly known as: VITAMIN D   Take 1,000 Units by mouth daily.      citalopram 10 MG tablet   Commonly known as: CELEXA   Take 1 tablet (10 mg total) by mouth at bedtime.      ezetimibe 10 MG tablet   Commonly known as: ZETIA   Take 10 mg by mouth daily.      felodipine 5 MG 24 hr tablet   Commonly known as: PLENDIL   Take 5 mg by mouth daily.      furosemide 20 MG tablet   Commonly known as: LASIX   Take 20 mg by mouth daily.      nitroGLYCERIN 0.4 MG SL tablet   Commonly known as: NITROSTAT   Place 1 tablet (0.4 mg total) under the tongue every 5 (five) minutes as needed for chest pain.      oxyCODONE-acetaminophen 10-325 MG per tablet   Commonly known as: PERCOCET   Take 1 tablet by mouth every 6 (six) hours as needed. For pain      potassium chloride 10 MEQ tablet   Commonly known as: K-DUR   Take 10 mEq by mouth daily.      PreserVision AREDS Caps   Take 1 capsule by  mouth daily.      risperiDONE 0.5 MG tablet   Commonly known as: RISPERDAL   Take 1 tablet (0.5 mg total) by mouth 2 (two) times daily.      simvastatin 20 MG tablet   Commonly known as: ZOCOR   Take 20 mg by mouth at bedtime.      valsartan 80 MG tablet   Commonly known as: DIOVAN   Take 80 mg by mouth daily.      vitamin B-1 250 MG tablet   Take 250 mg by mouth daily.            Discharge Orders    Future Orders Please Complete By Expires   Diet - low sodium heart healthy      Activity as tolerated - No restrictions      Driving Restrictions      Comments:   No driving until cleared by your physician      Follow-up Information    Follow up with psychiatrist. (ASAP)       Follow up with BUTLER, CYNTHIA, DO in 2  weeks.   Contact information:   [address] [place] [address] [phone]          Disposition: home with family  Discharged Condition: improved  Consults:  telepsychiatry  Labs:   Results for orders placed during the hospital encounter of 02/03/12 (from the past 48 hour(s))  ETHANOL     Status: Normal   Collection Time   02/03/12 11:52 PM      Component Value Range Comment   Alcohol, Ethyl (B) <11  0 - 11 mg/dL   RPR     Status: Normal   Collection Time   02/03/12 11:52 PM      Component Value Range Comment   RPR NON REACTIVE  NON REACTIVE   URINE RAPID DRUG SCREEN (HOSP PERFORMED)     Status: Normal   Collection Time   02/04/12 12:06 AM      Component Value Range Comment   Opiates NONE DETECTED  NONE DETECTED    Cocaine NONE DETECTED  NONE DETECTED    Benzodiazepines NONE DETECTED  NONE DETECTED    Amphetamines NONE DETECTED  NONE DETECTED    Tetrahydrocannabinol NONE DETECTED  NONE DETECTED    Barbiturates NONE DETECTED  NONE DETECTED   GLUCOSE, CAPILLARY     Status: Abnormal   Collection Time   02/04/12  2:57 AM      Component Value Range Comment   Glucose-Capillary 132 (*) 70 - 99 mg/dL   APTT     Status:  Normal   Collection Time   02/04/12  5:19 AM      Component Value Range Comment   aPTT 32  24 - 37 seconds   PROTIME-INR     Status: Normal   Collection Time   02/04/12  5:19 AM      Component Value Range Comment   Prothrombin Time 13.8  11.6 - 15.2 seconds    INR 1.04  0.00 - 1.49   TSH     Status: Normal   Collection Time   02/04/12  5:19 AM      Component Value Range Comment   TSH 0.645  0.350 - 4.500 uIU/mL   BASIC METABOLIC PANEL     Status: Abnormal   Collection Time   02/04/12  5:19 AM      Component Value Range Comment   Sodium 138  135 - 145 mEq/L    Potassium 3.3 (*) 3.5 - 5.1 mEq/L    Chloride 99  96 - 112 mEq/L    CO2 25  19 - 32 mEq/L    Glucose, Bld 111 (*) 70 - 99 mg/dL    BUN 15  6 - 23 mg/dL    Creatinine, Ser 3.29  0.50 - 1.35 mg/dL    Calcium 9.9  8.4 - 51.8 mg/dL    GFR calc non Af Amer 57 (*) >90 mL/min    GFR calc Af Amer 66 (*) >90 mL/min   CBC     Status: Normal   Collection Time   02/04/12  5:19 AM      Component Value Range Comment   WBC 8.6  4.0 - 10.5 K/uL    RBC 5.39  4.22 - 5.81 MIL/uL    Hemoglobin 15.7  13.0 - 17.0 g/dL    HCT 84.1  66.0 - 63.0 %    MCV 87.2  78.0 - 100.0 fL    MCH 29.1  26.0 - 34.0 pg    MCHC 33.4  30.0 - 36.0 g/dL    RDW 16.1  09.6 - 04.5 %    Platelets 181  150 - 400 K/uL   HIV ANTIBODY (ROUTINE TESTING)     Status: Normal   Collection Time   02/04/12  5:30 AM      Component Value Range Comment   HIV NON REACTIVE  NON REACTIVE     Diagnostics:  Dg Chest 2 View  02/03/2012  *RADIOLOGY REPORT*  Clinical Data: Mental status change  CHEST - 2 VIEW  Comparison: 07/14/2011  Findings: Previous median sternotomy and CABG procedure.  No pleural effusion or edema.  No airspace consolidation identified.  There are coarsened interstitial markings identified bilaterally consistent with COPD.  IMPRESSION:  1.  No acute cardiopulmonary abnormalities.  Original Report Authenticated By: Rosealee Albee, M.D.   Ct Head Wo  Contrast  02/03/2012  *RADIOLOGY REPORT*  Clinical Data: Altered mental status  CT HEAD WITHOUT CONTRAST  Technique:  Contiguous axial images were obtained from the base of the skull through the vertex without contrast.  Comparison: 01/21/2008  Findings: There is diffuse patchy low density throughout the subcortical and periventricular white matter consistent with chronic small vessel ischemic change.  There is prominence of the sulci and ventricles consistent with brain atrophy.  There is no evidence for acute brain infarct, hemorrhage or mass.  The paranasal sinuses and mastoid air cells are clear.  The skull is intact.  IMPRESSION:  1.  Small vessel ischemic disease and brain atrophy. 2.  No acute intracranial abnormalities.  Original Report Authenticated By: Rosealee Albee, M.D.   Mr Laqueta Jean Wo Contrast  02/04/2012  *RADIOLOGY REPORT*  Clinical Data: No acute change in the headache year.  Confusion.  MRI HEAD WITHOUT AND WITH CONTRAST  Technique:  Multiplanar, multiecho pulse sequences of the brain and surrounding structures were obtained according to standard protocol without and with intravenous contrast  Contrast: 10mL MULTIHANCE GADOBENATE DIMEGLUMINE 529 MG/ML IV SOLN  Comparison: CT head without contrast 02/03/2012.  Findings: No acute infarct, hemorrhage, mass lesion is present. Atrophy white matter changes are stable from the prior exam.  The study is moderately degraded by patient motion, decreasing sensitivity for small lesions.  Flow is present in the major intracranial arteries.  The patient is status post bilateral lens extractions.  The paranasal sinuses mastoid air cells are clear.  The postcontrast images are unremarkable.  IMPRESSION:  1.  No acute intracranial abnormality or significant interval change. 2.  Stable atrophy and white matter disease.  Original Report Authenticated By: Jamesetta Orleans. MATTERN, M.D.   Full Code   Hospital Course: The H&P for complete admission details. The  patient is a 78 year old white male who was brought to the emergency room twice by family with hallucinations. He was seeing bugs and people fighting. On the first visit, he was given the prescription for Bactrim and sent home with family. His urinalysis did not show much but he was diagnosed with prostatitis. Apparently his psychosis worsened so his family brought him back to the emergency room. He was evaluated by the behavioral health team but family did not want him admitted to a psych facility. The hospitalists were called to rule out medical etiology. He has had a negative workup including MRI and CAT scan of the brain. Urinalysis prior to admission showed no white cells no leukocyte esterase no nitrites. TSH and RPR are unremarkable. His hallucinations have improved while here and he has had a good nights rest  last night. He had not slept at home for several days. Telepsychiatry was consulted. Dr. Sela Hua reports that the patient has been depressed since the death of his wife of 50 years. The patient has been losing weight. He was diagnosed with major depressive disorder with psychotic behavior. Celexa 10 mg nightly, Risperdal 0.5 mg nightly, and outpatient referral to psychiatrist as recommended. He is not suicidal homicidal or combative and does not require inpatient psychiatric transfer at this time. He is medically and psychiatrically cleared for discharge. I have been speaking with family members to update them on developments. Social work has been consulted and will make a referral to psychiatry as an outpatient. Total time on the day of discharge greater than 30 minutes.  Discharge Exam:  Blood pressure 181/84, pulse 89, temperature 98.2 F (36.8 C), temperature source Oral, resp. rate 19, height 5\' 6"  (1.676 m), weight 55.43 kg (122 lb 3.2 oz), SpO2 92.00%.  General: Asleep. Arousable. Appropriate. No obvious hallucinations currently. Calm and cooperative. Lungs: Clear to auscultation  bilaterally without wheeze rhonchi or rales Cardiovascular regular rate rhythm without murmurs gallops rubs Abdomen soft nontender nondistended Extremities no clubbing cyanosis or edema  Signed: Chevette Fee L 02/05/2012, 9:14 AM

## 2012-02-05 NOTE — Clinical Social Work Psychosocial (Signed)
Clinical Social Work Department BRIEF PSYCHOSOCIAL ASSESSMENT 02/05/2012  Patient:  [name], [name]     Account Number:  [id number]     Admit date:  02/03/2012  Clinical Social Worker:  Nancie Neas  Date/Time:  02/05/2012 11:25 AM  Referred by:  Physician  Date Referred:  02/05/2012 Referred for  Behavioral Health Issues   Other Referral:   Interview type:  Patient Other interview type:   and daughter, [name]    PSYCHOSOCIAL DATA Living Status:  ALONE Admitted from facility:   Level of care:   Primary support name:  [name] Primary support relationship to patient:  CHILD, ADULT Degree of support available:   supportive per pt    CURRENT CONCERNS Current Concerns  Behavioral Health Issues   Other Concerns:    SOCIAL WORK ASSESSMENT / PLAN CSW met with pt and pt's daughter at bedside.  Pt alert and oriented and reports he lives alone, but has his teenage grandsons living with him this summer.  Pt states he does well at home and is ready to leave hospital.  Pt was evaluated by telepsych and cleared but MD requesting outpatient pscyh appointment.  CSW arranged with [facility]. He has an appointment July 23 with Dr. Kieth Brightly and August 1 with Dr. Lolly Mustache.  Daughter aware and will accompany pt.  Appointments on d/c instructions. 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   Assessment/plan status:  Referral to [facility] Other assessment/ plan:   Information/referral to community resources:   [employer]    PATIENT'S/FAMILY'S RESPONSE TO PLAN OF CARE: Pt and pt's daughter appreciative of CSW visit.  Pt appears to be back to baseline and will follow up with therapy/psych appointments on an outpatient basis.        Derenda Fennel, [place] [phone]

## 2012-02-05 NOTE — Clinical Social Work Psychosocial (Deleted)
Clinical Social Work Department BRIEF PSYCHOSOCIAL ASSESSMENT 02/05/2012  Patient:  [name]     Account Number:  [id number]     Admit date:  02/01/2012  Clinical Social Worker:  Nancie Neas  Date/Time:  02/05/2012 10:50 AM  Referred by:  Physician  Date Referred:  02/05/2012 Referred for  ALF Placement   Other Referral:   Interview type:  Patient Other interview type:   POA-[name]    PSYCHOSOCIAL DATA Living Status:  FACILITY Admitted from facility:  [facility] Level of care:  Assisted Living Primary support name:  [name] Primary support relationship to patient:  FRIEND Degree of support available:   supportive per pt.    CURRENT CONCERNS Current Concerns  Post-Acute Placement   Other Concerns:    SOCIAL WORK ASSESSMENT / PLAN Psychosocial not completed over weekend when admitted.  CSW met with pt and pt's POA [name] at bedside.  Pt alert and oriented and reports she has been a resident at [facility] for over a year.  She states she plans to return at d/c and that things have been going well there.  [name] appears to be involved and supportive.  PT evaluated pt and recommended SNF. [name] at [facility] assessed pt this morning and reports she is at baseline.  Home health PT set up by CM.  Pt on chronic oxygen.  Pt d/c today by MD. Steve Evans, RN reviewed [facility] for accuracy. D/C summary provided to [name].  Pt will transfer with [name] back to facility.   Assessment/plan status:  No Further Intervention Required Other assessment/ plan:   Information/referral to community resources:   [facility]  CM for home health    PATIENT'S/FAMILY'S RESPONSE TO PLAN OF CARE: Pt reports positive feelings regarding return to [facility] at d/c. Pt's POA, [name] also agreeable.         [name], [place] [phone]

## 2012-02-05 NOTE — Progress Notes (Signed)
Pt discharged home today per Dr. Lendell Caprice. Pt's IV site D/C'd by NT and WNL. Pt's VS stable at this time. Pt provided with home medication list, discharge instructions and prescriptions. Verbalized understanding. Pt left floor via WC in stable condition accompanied by NT.

## 2012-02-07 ENCOUNTER — Telehealth (HOSPITAL_COMMUNITY): Payer: Self-pay | Admitting: Licensed Clinical Social Worker

## 2012-02-07 ENCOUNTER — Emergency Department (HOSPITAL_COMMUNITY)
Admission: EM | Admit: 2012-02-07 | Discharge: 2012-02-11 | Disposition: A | Discharge status: Other Acute Inpatient Hospital | Payer: Medicare Other | Attending: Emergency Medicine | Admitting: Emergency Medicine

## 2012-02-07 ENCOUNTER — Encounter (HOSPITAL_COMMUNITY): Payer: Self-pay | Admitting: *Deleted

## 2012-02-07 DIAGNOSIS — E782 Mixed hyperlipidemia: Secondary | ICD-10-CM | POA: Insufficient documentation

## 2012-02-07 DIAGNOSIS — I2510 Atherosclerotic heart disease of native coronary artery without angina pectoris: Secondary | ICD-10-CM | POA: Insufficient documentation

## 2012-02-07 DIAGNOSIS — Z79899 Other long term (current) drug therapy: Secondary | ICD-10-CM | POA: Insufficient documentation

## 2012-02-07 DIAGNOSIS — Z7982 Long term (current) use of aspirin: Secondary | ICD-10-CM | POA: Insufficient documentation

## 2012-02-07 DIAGNOSIS — I10 Essential (primary) hypertension: Secondary | ICD-10-CM | POA: Insufficient documentation

## 2012-02-07 DIAGNOSIS — F29 Unspecified psychosis not due to a substance or known physiological condition: Secondary | ICD-10-CM | POA: Insufficient documentation

## 2012-02-07 DIAGNOSIS — Z951 Presence of aortocoronary bypass graft: Secondary | ICD-10-CM | POA: Insufficient documentation

## 2012-02-07 DIAGNOSIS — M1990 Unspecified osteoarthritis, unspecified site: Secondary | ICD-10-CM | POA: Insufficient documentation

## 2012-02-07 LAB — BASIC METABOLIC PANEL
BUN: 17 mg/dL (ref 6–23)
Creatinine, Ser: 1.44 mg/dL — ABNORMAL HIGH (ref 0.50–1.35)
GFR calc Af Amer: 53 mL/min — ABNORMAL LOW (ref >90)
GFR calc non Af Amer: 45 mL/min — ABNORMAL LOW (ref >90)

## 2012-02-07 LAB — RAPID URINE DRUG SCREEN, HOSP PERFORMED
Amphetamines: NOT DETECTED
Barbiturates: NOT DETECTED
Benzodiazepines: NOT DETECTED
Cocaine: NOT DETECTED
Opiates: NOT DETECTED
Tetrahydrocannabinol: NOT DETECTED

## 2012-02-07 LAB — URINALYSIS, ROUTINE W REFLEX MICROSCOPIC
Ketones, ur: NEGATIVE mg/dL
Protein, ur: 30 mg/dL — AB
Urobilinogen, UA: 0.2 mg/dL (ref 0.0–1.0)

## 2012-02-07 LAB — CBC
HCT: 41.3 % (ref 39.0–52.0)
MCH: 29.2 pg (ref 26.0–34.0)
MCHC: 34.4 g/dL (ref 30.0–36.0)
RDW: 14.5 % (ref 11.5–15.5)

## 2012-02-07 LAB — URINE MICROSCOPIC-ADD ON

## 2012-02-07 MED ORDER — SIMVASTATIN 20 MG PO TABS
20.0000 mg | ORAL_TABLET | Freq: Every day | ORAL | Status: DC
  Administered 2012-02-07 – 2012-02-10 (×4): 20 mg via ORAL
  Filled 2012-02-07 (×6): qty 1

## 2012-02-07 MED ORDER — CEPHALEXIN 500 MG PO CAPS: 500.0000 mg | ORAL_CAPSULE | Freq: Three times a day (TID) | ORAL | Status: DC

## 2012-02-07 MED ORDER — DEXTROSE 5 % IV SOLN: 1.0000 g | Freq: Once | INTRAVENOUS | Status: AC

## 2012-02-07 MED ORDER — ASPIRIN 325 MG PO TABS
325.0000 mg | ORAL_TABLET | Freq: Every day | ORAL | Status: DC
Start: 2012-02-07 — End: 2012-02-07
  Administered 2012-02-07: 325 mg via ORAL

## 2012-02-07 MED ORDER — FELODIPINE ER 5 MG PO TB24
5.0000 mg | ORAL_TABLET | Freq: Every day | ORAL | Status: DC
  Administered 2012-02-07 – 2012-02-11 (×5): 5 mg via ORAL
  Filled 2012-02-07 (×5): qty 1

## 2012-02-07 MED ORDER — RISPERIDONE 0.5 MG PO TABS
0.5000 mg | ORAL_TABLET | Freq: Two times a day (BID) | ORAL | Status: DC
  Administered 2012-02-07 – 2012-02-11 (×7): 0.5 mg via ORAL
  Filled 2012-02-07 (×7): qty 1

## 2012-02-07 MED ORDER — EZETIMIBE 10 MG PO TABS
10.0000 mg | ORAL_TABLET | Freq: Every day | ORAL | Status: DC
  Administered 2012-02-07 – 2012-02-11 (×5): 10 mg via ORAL
  Filled 2012-02-07 (×5): qty 1

## 2012-02-07 MED ORDER — DEXTROSE 5 % IV SOLN
1.0000 g | INTRAVENOUS | Status: DC
  Administered 2012-02-07: 1 g via INTRAVENOUS
  Filled 2012-02-07: qty 10

## 2012-02-07 MED ORDER — VITAMIN B-1 100 MG PO TABS
250.0000 mg | ORAL_TABLET | Freq: Every day | ORAL | Status: DC
  Administered 2012-02-07 – 2012-02-11 (×5): 250 mg via ORAL
  Filled 2012-02-07 (×2): qty 3
  Filled 2012-02-07 (×2): qty 1
  Filled 2012-02-07: qty 3

## 2012-02-07 MED ORDER — POTASSIUM CHLORIDE ER 10 MEQ PO TBCR
10.0000 meq | EXTENDED_RELEASE_TABLET | Freq: Every day | ORAL | Status: DC
  Administered 2012-02-07 – 2012-02-11 (×5): 10 meq via ORAL
  Filled 2012-02-07 (×6): qty 1

## 2012-02-07 MED ORDER — ZIPRASIDONE HCL 20 MG PO CAPS
20.0000 mg | ORAL_CAPSULE | Freq: Once | ORAL | Status: AC
  Administered 2012-02-07: 20 mg via ORAL
  Filled 2012-02-07: qty 1

## 2012-02-07 MED ORDER — SODIUM CHLORIDE 0.9 % IV BOLUS (SEPSIS)
500.0000 mL | Freq: Once | INTRAVENOUS | Status: AC
  Administered 2012-02-07: 500 mL via INTRAVENOUS

## 2012-02-07 MED ORDER — FUROSEMIDE 20 MG PO TABS
20.0000 mg | ORAL_TABLET | Freq: Every day | ORAL | Status: DC
  Administered 2012-02-07 – 2012-02-11 (×5): 20 mg via ORAL
  Filled 2012-02-07 (×5): qty 1

## 2012-02-07 MED ORDER — IRBESARTAN 75 MG PO TABS
75.0000 mg | ORAL_TABLET | Freq: Every day | ORAL | Status: DC
  Administered 2012-02-07 – 2012-02-11 (×5): 75 mg via ORAL
  Filled 2012-02-07 (×5): qty 1

## 2012-02-07 MED ORDER — ASPIRIN 325 MG PO TABS
325.0000 mg | ORAL_TABLET | Freq: Every day | ORAL | Status: DC
  Administered 2012-02-08 – 2012-02-11 (×4): 325 mg via ORAL
  Filled 2012-02-07 (×5): qty 1

## 2012-02-07 MED ORDER — ALBUTEROL SULFATE HFA 108 (90 BASE) MCG/ACT IN AERS: 2.0000 | INHALATION_SPRAY | RESPIRATORY_TRACT | Status: DC | PRN

## 2012-02-07 MED ORDER — CEPHALEXIN 500 MG PO CAPS
500.0000 mg | ORAL_CAPSULE | Freq: Three times a day (TID) | ORAL | Status: DC
  Administered 2012-02-08 – 2012-02-11 (×11): 500 mg via ORAL
  Filled 2012-02-07 (×12): qty 1

## 2012-02-07 MED ORDER — CITALOPRAM HYDROBROMIDE 10 MG PO TABS
10.0000 mg | ORAL_TABLET | Freq: Every day | ORAL | Status: DC
  Administered 2012-02-07 – 2012-02-10 (×4): 10 mg via ORAL
  Filled 2012-02-07 (×6): qty 1

## 2012-02-07 MED ORDER — ATENOLOL 25 MG PO TABS
25.0000 mg | ORAL_TABLET | Freq: Every day | ORAL | Status: DC
  Administered 2012-02-07 – 2012-02-11 (×5): 25 mg via ORAL
  Filled 2012-02-07 (×5): qty 1

## 2012-02-07 NOTE — ED Provider Notes (Addendum)
[facility] requesting in addition to work-up today that pt have head CT, CXR and UDS. Pt with head CT on 6/30 and MRI brain on 7/1. CXR on 6/30. Pt undergoing evaluation for same symptoms at the time of these studies. Without acute interim change there is no indication for repeat imaging at this time. UDS was added.  Raeford Razor, MD 02/07/12 2116   Filed Vitals:   02/11/12 0736  BP: 147/89  Pulse: 70  Temp: 98.4 F (36.9 C)  Resp:    Pt seen and assessed this am. NAD. Affect and behavior consistent with my prior eval. Apparently may be placed today.  Raeford Razor, MD 02/11/12 [phone]

## 2012-02-07 NOTE — ED Notes (Signed)
Pt given [place] sandwich and sprite per request. Sitting up in bed eating with family at bedside. Will continue to monitor.
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
## 2012-02-07 NOTE — ED Notes (Signed)
Daughters have left to go home after speaking to act team. After daughters left patient trying to get out of recliner. Charge nurse Terri informed of Recruitment consultant needed. Terri, CN stated she would informed AC for need of safety sitter but it would probably 2300 before we would get one. Nurse tech Scheryl Marten at patient bedside sitting with patient until safety sitter available.
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
## 2012-02-07 NOTE — ED Notes (Signed)
Act team in to evaluate patient with daughters in room.

## 2012-02-08 LAB — URINE CULTURE
Colony Count: NO GROWTH
Culture: NO GROWTH

## 2012-02-08 LAB — BASIC METABOLIC PANEL
GFR calc Af Amer: 66 mL/min — ABNORMAL LOW (ref >90)
GFR calc non Af Amer: 57 mL/min — ABNORMAL LOW (ref >90)
Potassium: 4.4 mEq/L (ref 3.5–5.1)
Sodium: 136 mEq/L (ref 135–145)

## 2012-02-08 MED ORDER — LORAZEPAM 2 MG/ML IJ SOLN
1.0000 mg | Freq: Once | INTRAMUSCULAR | Status: AC
  Administered 2012-02-08: 1 mg via INTRAVENOUS
  Filled 2012-02-08: qty 1

## 2012-02-08 NOTE — ED Notes (Signed)
Patient scooted down to end of bed and refuses to move back up in bed. Patient yelling out inappropriate things. Like" Go get the slop bucket and feed the pigs" also " I need to put my clothes on because I have to go to work". Patient informed that his daughters took his clothes home and patient states " the hell if they did."

## 2012-02-08 NOTE — ED Notes (Signed)
Patient dentures removed and placed in denture cup.

## 2012-02-08 NOTE — BHH Counselor (Signed)
TC from [place] @ [facility]. Need copy of IVC papers and also an EKG. No EKG on chart. Updated RN to get this ordered.
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
## 2012-02-08 NOTE — BHH Counselor (Signed)
TC from [facility]. Stated that the MD declined due to the EKG and felt pt was not medically stable.

## 2012-02-08 NOTE — ED Notes (Signed)
Patient is resting comfortably with eyes closed. Respirations equal and unlabored. Skin warm and dry. No acute distress noted. 

## 2012-02-08 NOTE — BHH Counselor (Signed)
Most recent EKG did not support A-Fib. TC with Pam @ [facility] and explained that the EDP reviewed pt's chart and since no prior hx of a-fib monitored pt and ordered another EKG, which did not support a-fib. Stated she would have the charge RN give me a call back to see if they will re-review pt for admission.

## 2012-02-08 NOTE — ED Notes (Signed)
Patient swing at staff members talking about he needs to put his clothes on and leave. Tried to re-orient patient patient attempt unsuccessful. Dr. Norlene Campbell notified and reported that she would come and evaluate patient.

## 2012-02-08 NOTE — ED Notes (Signed)
Pt. got upset  So we ask the safety sitter to cut TV off for a while to calm him down.

## 2012-02-09 MED ORDER — ACETAMINOPHEN 325 MG PO TABS
650.0000 mg | ORAL_TABLET | Freq: Once | ORAL | Status: AC
  Administered 2012-02-09: 650 mg via ORAL
  Filled 2012-02-09: qty 2

## 2012-02-09 NOTE — ED Notes (Signed)
EKG refaxed to [facility] for review.

## 2012-02-09 NOTE — ED Provider Notes (Signed)
Pt seen and evaluated, he is resting comfortably, no current complaints- apparently he was declined at [facility], information has been sent to [facility].    Ethelda Chick, MD 02/10/12 [phone]

## 2012-02-09 NOTE — ED Notes (Addendum)
Patient calm resting in bed with no complaints at this time. Lunch tray given to patient.

## 2012-02-10 MED ORDER — ACETAMINOPHEN 325 MG PO TABS
650.0000 mg | ORAL_TABLET | Freq: Once | ORAL | Status: AC
  Administered 2012-02-10: 650 mg via ORAL
  Filled 2012-02-10: qty 2

## 2012-02-10 NOTE — ED Notes (Signed)
Per Darl Pikes at [facility] there are no beds today, some possible d/c's tomorrow.  Per Olegario Messier at [facility] there are no beds available today.  ACT/CSW to follow up tomorrow to determine if a bed vacancy exist at these facilities.

## 2012-02-10 NOTE — ED Notes (Signed)
Family at bedside. 

## 2012-02-10 NOTE — ED Notes (Signed)
Patient is resting comfortably in chair.

## 2012-02-10 NOTE — ED Notes (Signed)
CSW telephoned intake worker at [facility] to follow up on referral. Writer left a voicemail requesting return phone about the status of pt's referral.  Janann Colonel., MSW, [facility] Clinical Social Worker [phone]

## 2012-02-10 NOTE — ED Notes (Signed)
CSW received telephone call from [facility] reporting pt has been declined by their MD. CSW/ACT will purse alternative placement options for tx.  Janann Colonel., MSW, [facility] Clinical Social Worker [phone]

## 2012-02-10 NOTE — ED Notes (Signed)
Pt assisted to the bathroom.

## 2012-02-10 NOTE — ED Provider Notes (Signed)
BP 140/82  Pulse 76  Temp 98.6 F (37 C) (Oral)  Resp 20  Wt 120 lb (54.432 kg)  SpO2 96% Pt resting comfortably. No acute issues overnight. Awaiting placement.   Loren Racer, MD 02/10/12 [phone]

## 2012-02-11 MED ORDER — ACETAMINOPHEN 325 MG PO TABS
650.0000 mg | ORAL_TABLET | Freq: Once | ORAL | Status: AC
  Administered 2012-02-11: 650 mg via ORAL
  Filled 2012-02-11 (×2): qty 2

## 2012-02-11 MED ORDER — LOPERAMIDE HCL 2 MG PO CAPS
2.0000 mg | ORAL_CAPSULE | Freq: Once | ORAL | Status: AC
  Administered 2012-02-11: 2 mg via ORAL
  Filled 2012-02-11: qty 1

## 2012-02-11 MED ORDER — ENSURE COMPLETE PO LIQD
237.0000 mL | Freq: Two times a day (BID) | ORAL | Status: DC
  Administered 2012-02-11: 237 mL via ORAL
  Filled 2012-02-11 (×2): qty 237

## 2012-02-11 NOTE — BHH Counselor (Signed)
Per Jamesetta So at [facility] hospital patient accepted to their Behavioral Medicine unit. Patient accepted by Dr. Quintin Alto. The call report number is [phone] extension [phone]. Writer contacted Dr. Juleen China and made him aware of patient's acceptance to [facility] and he is agreeable to discharge patient. Writer also contacted patient's nurse Mardella Layman to update her on patient's acceptance, call report number, etc. Patient is under IVC and awaiting transport via [facility].

## 2012-02-11 NOTE — Progress Notes (Signed)
[facility] ED CM answered calls x 2 from 2 male family members Connected one with ED RN, Lillia Abed and the other to the pt's room telephone number Pt EPIC notes states d/c disposition: awaiting facility placement

## 2012-02-11 NOTE — ED Notes (Signed)
[facility] notified of need for transport. States they will be here around 17:00. States as long as they can assist pt into [facility] with our help they do not need PTAR to transport.

## 2012-02-11 NOTE — ED Notes (Signed)
Field seismologist at bedside for transport to [facility]. [facility]

## 2012-02-20 ENCOUNTER — Encounter: Payer: Self-pay | Admitting: Cardiology

## 2012-02-26 ENCOUNTER — Ambulatory Visit (HOSPITAL_COMMUNITY): Payer: Medicare Other | Admitting: Psychology

## 2012-03-06 ENCOUNTER — Ambulatory Visit (HOSPITAL_COMMUNITY): Payer: Medicare Other | Admitting: Psychiatry

## 2012-03-10 ENCOUNTER — Other Ambulatory Visit: Payer: Self-pay | Admitting: *Deleted

## 2012-03-10 DIAGNOSIS — E785 Hyperlipidemia, unspecified: Secondary | ICD-10-CM

## 2012-03-10 DIAGNOSIS — I10 Essential (primary) hypertension: Secondary | ICD-10-CM

## 2012-04-03 ENCOUNTER — Other Ambulatory Visit: Payer: Self-pay | Admitting: *Deleted

## 2012-04-03 ENCOUNTER — Encounter: Payer: Self-pay | Admitting: *Deleted

## 2012-04-03 DIAGNOSIS — I10 Essential (primary) hypertension: Secondary | ICD-10-CM

## 2012-04-03 DIAGNOSIS — E782 Mixed hyperlipidemia: Secondary | ICD-10-CM

## 2012-04-17 ENCOUNTER — Encounter: Payer: Self-pay | Admitting: Cardiology

## 2012-04-17 ENCOUNTER — Ambulatory Visit: Payer: Medicare Other | Admitting: Cardiology

## 2012-04-17 ENCOUNTER — Ambulatory Visit (INDEPENDENT_AMBULATORY_CARE_PROVIDER_SITE_OTHER): Payer: Medicare Other | Admitting: Cardiology

## 2012-04-17 VITALS — BP 141/67 | HR 65 | Ht 63.0 in | Wt 128.0 lb

## 2012-04-17 DIAGNOSIS — R600 Localized edema: Secondary | ICD-10-CM

## 2012-04-17 DIAGNOSIS — E782 Mixed hyperlipidemia: Secondary | ICD-10-CM

## 2012-04-17 DIAGNOSIS — R609 Edema, unspecified: Secondary | ICD-10-CM

## 2012-04-17 DIAGNOSIS — I10 Essential (primary) hypertension: Secondary | ICD-10-CM

## 2012-04-17 DIAGNOSIS — I2510 Atherosclerotic heart disease of native coronary artery without angina pectoris: Secondary | ICD-10-CM

## 2012-04-17 NOTE — Patient Instructions (Signed)
Your physician recommends that you schedule a follow-up appointment in: 4 months  Your physician recommends that you return for lab work in: Within the next week

## 2012-04-17 NOTE — Assessment & Plan Note (Signed)
Continue conservative management and medical therapy. Followup arranged.

## 2012-04-17 NOTE — Assessment & Plan Note (Signed)
Lipids profile also to be obtained. He continues on statin therapy.

## 2012-04-17 NOTE — Progress Notes (Signed)
Clinical Summary Mr. Villard is a 78 y.o.male presenting for followup. He was seen in March. Interval records reviewed including recent behavioral health admission with mental status changes - details are not clear. He states that he is back home. We reviewed his medications. He denies any chest pain or progressive dyspnea. No syncope.  He has not had recent followup labwork that had been palnned (BMET and lipids).  He continues to prefer a conservative approach to his cardiac care - declines followup testing.  No Known Allergies  Current Outpatient Prescriptions  Medication Sig Dispense Refill  . aspirin 325 MG tablet Take 325 mg by mouth daily.      Marland Kitchen atenolol (TENORMIN) 25 MG tablet Take 25 mg by mouth daily.       . citalopram (CELEXA) 10 MG tablet Take 10 mg by mouth at bedtime.      Marland Kitchen ezetimibe (ZETIA) 10 MG tablet Take 10 mg by mouth daily.        . felodipine (PLENDIL) 5 MG 24 hr tablet Take 5 mg by mouth daily.       . furosemide (LASIX) 20 MG tablet Take 20 mg by mouth daily.      . irbesartan (AVAPRO) 75 MG tablet Take 75 mg by mouth at bedtime.      Marland Kitchen oxyCODONE-acetaminophen (PERCOCET) 10-325 MG per tablet Take 1 tablet by mouth every 6 (six) hours as needed. For pain      . potassium chloride (K-DUR) 10 MEQ tablet Take 10 mEq by mouth daily.      . simvastatin (ZOCOR) 20 MG tablet Take 20 mg by mouth at bedtime.       . Thiamine HCl (VITAMIN B-1) 250 MG tablet Take 250 mg by mouth daily.        . risperiDONE (RISPERDAL) 0.5 MG tablet Take 0.5 mg by mouth 2 (two) times daily.        Past Medical History  Diagnosis Date  . Coronary atherosclerosis of native coronary artery     Multivessel  . Essential hypertension, benign   . Mixed hyperlipidemia   . Erectile dysfunction   . Osteoarthritis   . Myocardial infarction     IMI 1987 and PLMI 1997  . Rheumatoid arthritis, adult     Past Surgical History  Procedure Date  . Coronary artery bypass graft 1987  . Carpal  tunnel release   . Rotator cuff repair   . Neck surgery     Social History Mr. Rosencrans reports that he quit smoking about 9 months ago. His smoking use included Cigarettes. He has a 60 pack-year smoking history. He has never used smokeless tobacco. Mr. Vanroekel reports that he does not drink alcohol.  Review of Systems Arthritic limitations as before. Denies falls, uses cane. Otherwise negative.  Physical Examination Filed Vitals:   04/17/12 1126  BP: 141/67  Pulse: 65   Filed Weights   04/17/12 1126  Weight: 128 lb (58.06 kg)   Chronically ill appearing male in no acute distress.  HEENT: Conjuctivae normal, oropharynx with poor dentition.  Neck: No bruits, no thyromegaly.  Lungs: Decreased breath sound without wheezes.  Cardiac: Regular rate and rhythm with soft basal systolic murmur.  Abdomen: Soft, NABS.  Skin: Warm and dry.  Extremities: Arthritic deformities, 1-2+ edema below the knees.  Neuropsychiatric: Alert and oriented x 3, affect appropriate.   Problem List and Plan   Coronary atherosclerosis of native coronary artery Continue conservative management and medical therapy. Followup arranged.  Leg edema Chronic, continue diuretic therapy. Needs followup BMET which will be arranged.  Mixed hyperlipidemia Lipids profile also to be obtained. He continues on statin therapy.    Jonelle Sidle, M.D., F.A.C.C.

## 2012-04-17 NOTE — Assessment & Plan Note (Signed)
Chronic, continue diuretic therapy. Needs followup BMET which will be arranged.

## 2012-04-18 ENCOUNTER — Encounter: Payer: Self-pay | Admitting: *Deleted

## 2012-05-28 ENCOUNTER — Other Ambulatory Visit: Payer: Self-pay | Admitting: *Deleted

## 2012-05-28 MED ORDER — POTASSIUM CHLORIDE ER 10 MEQ PO TBCR: 10.0000 meq | EXTENDED_RELEASE_TABLET | Freq: Every day | ORAL | Status: DC

## 2012-05-28 MED ORDER — FUROSEMIDE 20 MG PO TABS: 20.0000 mg | ORAL_TABLET | Freq: Every day | ORAL | Status: DC

## 2012-12-19 ENCOUNTER — Telehealth: Payer: Self-pay | Admitting: *Deleted

## 2012-12-19 NOTE — Telephone Encounter (Signed)
Noted incoming fax from pt pharmacy to refill furosemide and potassium, called pt to advise he is overdue for f/u per noted 9-13 was to come back in 4 months to see Dr Dionicia Abler, will call again

## 2012-12-31 NOTE — Telephone Encounter (Signed)
Called pt to schedule apt with [facility], noted someone picked up pt line, would not speak, tried to call back and noted as busy

## 2013-01-02 NOTE — Telephone Encounter (Signed)
Number busy times 3

## 2013-01-05 MED ORDER — FUROSEMIDE 20 MG PO TABS: 20.0000 mg | ORAL_TABLET | Freq: Every day | ORAL | Status: AC

## 2013-01-05 NOTE — Telephone Encounter (Signed)
Pt accepted next available apt for [facility] as 02-19-13 at 2:20pm, sent refill for lasix per pt daughter noted he has plenty of the potassium left and he has not missed a dose, sent via escribe to confirmed drug store

## 2013-01-22 ENCOUNTER — Inpatient Hospital Stay (HOSPITAL_COMMUNITY)
Admission: EM | Admit: 2013-01-22 | Discharge: 2013-02-02 | DRG: 368 | Disposition: A | Discharge status: Home Health | Payer: Medicare Other | Attending: Internal Medicine | Admitting: Internal Medicine

## 2013-01-22 ENCOUNTER — Encounter (HOSPITAL_COMMUNITY): Payer: Self-pay | Admitting: Emergency Medicine

## 2013-01-22 ENCOUNTER — Emergency Department (HOSPITAL_COMMUNITY): Payer: Medicare Other

## 2013-01-22 DIAGNOSIS — M069 Rheumatoid arthritis, unspecified: Secondary | ICD-10-CM | POA: Diagnosis present

## 2013-01-22 DIAGNOSIS — I472 Ventricular tachycardia, unspecified: Secondary | ICD-10-CM | POA: Diagnosis present

## 2013-01-22 DIAGNOSIS — Z7982 Long term (current) use of aspirin: Secondary | ICD-10-CM

## 2013-01-22 DIAGNOSIS — G9340 Encephalopathy, unspecified: Secondary | ICD-10-CM | POA: Diagnosis present

## 2013-01-22 DIAGNOSIS — I4729 Other ventricular tachycardia: Secondary | ICD-10-CM | POA: Diagnosis present

## 2013-01-22 DIAGNOSIS — R5381 Other malaise: Secondary | ICD-10-CM

## 2013-01-22 DIAGNOSIS — I252 Old myocardial infarction: Secondary | ICD-10-CM

## 2013-01-22 DIAGNOSIS — I959 Hypotension, unspecified: Secondary | ICD-10-CM | POA: Diagnosis present

## 2013-01-22 DIAGNOSIS — I10 Essential (primary) hypertension: Secondary | ICD-10-CM | POA: Diagnosis present

## 2013-01-22 DIAGNOSIS — Z66 Do not resuscitate: Secondary | ICD-10-CM | POA: Diagnosis present

## 2013-01-22 DIAGNOSIS — F03918 Unspecified dementia, unspecified severity, with other behavioral disturbance: Secondary | ICD-10-CM | POA: Diagnosis present

## 2013-01-22 DIAGNOSIS — E876 Hypokalemia: Secondary | ICD-10-CM

## 2013-01-22 DIAGNOSIS — Z79899 Other long term (current) drug therapy: Secondary | ICD-10-CM

## 2013-01-22 DIAGNOSIS — J9600 Acute respiratory failure, unspecified whether with hypoxia or hypercapnia: Secondary | ICD-10-CM | POA: Diagnosis not present

## 2013-01-22 DIAGNOSIS — M1990 Unspecified osteoarthritis, unspecified site: Secondary | ICD-10-CM | POA: Diagnosis present

## 2013-01-22 DIAGNOSIS — R410 Disorientation, unspecified: Secondary | ICD-10-CM

## 2013-01-22 DIAGNOSIS — E782 Mixed hyperlipidemia: Secondary | ICD-10-CM | POA: Diagnosis present

## 2013-01-22 DIAGNOSIS — N390 Urinary tract infection, site not specified: Secondary | ICD-10-CM | POA: Diagnosis present

## 2013-01-22 DIAGNOSIS — F17200 Nicotine dependence, unspecified, uncomplicated: Secondary | ICD-10-CM | POA: Diagnosis present

## 2013-01-22 DIAGNOSIS — D649 Anemia, unspecified: Secondary | ICD-10-CM

## 2013-01-22 DIAGNOSIS — N179 Acute kidney failure, unspecified: Secondary | ICD-10-CM | POA: Diagnosis not present

## 2013-01-22 DIAGNOSIS — I214 Non-ST elevation (NSTEMI) myocardial infarction: Secondary | ICD-10-CM

## 2013-01-22 DIAGNOSIS — K226 Gastro-esophageal laceration-hemorrhage syndrome: Principal | ICD-10-CM | POA: Diagnosis present

## 2013-01-22 DIAGNOSIS — I2510 Atherosclerotic heart disease of native coronary artery without angina pectoris: Secondary | ICD-10-CM | POA: Diagnosis present

## 2013-01-22 DIAGNOSIS — Z951 Presence of aortocoronary bypass graft: Secondary | ICD-10-CM

## 2013-01-22 DIAGNOSIS — D62 Acute posthemorrhagic anemia: Secondary | ICD-10-CM | POA: Diagnosis present

## 2013-01-22 DIAGNOSIS — D696 Thrombocytopenia, unspecified: Secondary | ICD-10-CM | POA: Diagnosis not present

## 2013-01-22 DIAGNOSIS — F0391 Unspecified dementia with behavioral disturbance: Secondary | ICD-10-CM | POA: Diagnosis present

## 2013-01-22 DIAGNOSIS — R578 Other shock: Secondary | ICD-10-CM | POA: Diagnosis not present

## 2013-01-22 DIAGNOSIS — K922 Gastrointestinal hemorrhage, unspecified: Secondary | ICD-10-CM | POA: Diagnosis present

## 2013-01-22 LAB — CBC
Hemoglobin: 7.5 g/dL — ABNORMAL LOW (ref 13.0–17.0)
Platelets: 183 10*3/uL (ref 150–400)
RBC: 3 MIL/uL — ABNORMAL LOW (ref 4.22–5.81)
WBC: 9 10*3/uL (ref 4.0–10.5)

## 2013-01-22 LAB — POCT I-STAT TROPONIN I: Troponin i, poc: 0.01 ng/mL (ref 0.00–0.08)

## 2013-01-22 LAB — BASIC METABOLIC PANEL
CO2: 34 mEq/L — ABNORMAL HIGH (ref 19–32)
Chloride: 100 mEq/L (ref 96–112)
Potassium: 4.1 mEq/L (ref 3.5–5.1)
Sodium: 140 mEq/L (ref 135–145)

## 2013-01-22 LAB — PRO B NATRIURETIC PEPTIDE: Pro B Natriuretic peptide (BNP): 741.6 pg/mL — ABNORMAL HIGH (ref 0–450)

## 2013-01-22 LAB — OCCULT BLOOD, POC DEVICE: Fecal Occult Bld: POSITIVE — AB

## 2013-01-22 LAB — PREPARE RBC (CROSSMATCH)

## 2013-01-22 MED ORDER — PANTOPRAZOLE SODIUM 40 MG IV SOLR
40.0000 mg | Freq: Once | INTRAVENOUS | Status: AC
  Administered 2013-01-23: 40 mg via INTRAVENOUS
  Filled 2013-01-22: qty 40

## 2013-01-22 MED ORDER — SODIUM CHLORIDE 0.9 % IV BOLUS (SEPSIS)
500.0000 mL | Freq: Once | INTRAVENOUS | Status: AC
Start: 2013-01-22 — End: 2013-01-23
  Administered 2013-01-23: 500 mL via INTRAVENOUS

## 2013-01-22 NOTE — ED Provider Notes (Signed)
History     CSN: [id number]  Arrival date & time 01/22/13  2119   First MD Initiated Contact with Patient 01/22/13 2304      Chief Complaint  Patient presents with  . Loss of Consciousness    Patient is a 79 y.o. male presenting with syncope. The history is provided by a relative and the patient. The history is limited by the condition of the patient.  Loss of Consciousness Episode history:  Single Most recent episode:  Today Progression:  Improving Chronicity:  New Witnessed: yes   Relieved by:  None tried Worsened by:  Nothing tried Associated symptoms: diaphoresis and malaise/fatigue   Associated symptoms: no fever, no focal weakness, no recent fall, no shortness of breath and no vomiting   pt presents from home for syncopal episode and also increased confusion and fatigue today Pt has h/o dementia at baseline, however he is conversant.  Today after waking he has been confused with periods of unresponsiveness, which is different from his baseline No new meds.  No recent falls.  No fever reported Patient lives with daughter and family.   Past Medical History  Diagnosis Date  . Coronary atherosclerosis of native coronary artery     Multivessel  . Essential hypertension, benign   . Mixed hyperlipidemia   . Erectile dysfunction   . Osteoarthritis   . Myocardial infarction     IMI 1987 and PLMI 1997  . Rheumatoid arthritis, adult     Past Surgical History  Procedure Laterality Date  . Coronary artery bypass graft  1987  . Carpal tunnel release    . Rotator cuff repair    . Neck surgery      Family History  Problem Relation Age of Onset  . Coronary artery disease Father     Died age 12 with MI    History  Substance Use Topics  . Smoking status: Former Smoker -- 1.00 packs/day for 60 years    Types: Cigarettes    Quit date: 07/14/2011  . Smokeless tobacco: Never Used  . Alcohol Use: No     Comment: one beeer every 6 months      Review of Systems   Unable to perform ROS: Mental status change  Constitutional: Positive for malaise/fatigue and diaphoresis. Negative for fever.  Respiratory: Negative for shortness of breath.   Cardiovascular: Positive for syncope.  Gastrointestinal: Negative for vomiting.  Neurological: Negative for focal weakness.    Allergies  Review of patient's allergies indicates no known allergies.  Home Medications   Current Outpatient Rx  Name  Route  Sig  Dispense  Refill  . aspirin 325 MG tablet   Oral   Take 325 mg by mouth daily.         Marland Kitchen atenolol (TENORMIN) 25 MG tablet   Oral   Take 25 mg by mouth daily.          . citalopram (CELEXA) 10 MG tablet   Oral   Take 10 mg by mouth at bedtime.         Marland Kitchen ezetimibe (ZETIA) 10 MG tablet   Oral   Take 10 mg by mouth daily.           . felodipine (PLENDIL) 5 MG 24 hr tablet   Oral   Take 5 mg by mouth daily.          . furosemide (LASIX) 20 MG tablet   Oral   Take 1 tablet (20 mg total) by mouth  daily.   30 tablet   6   . irbesartan (AVAPRO) 75 MG tablet   Oral   Take 75 mg by mouth at bedtime.         Marland Kitchen oxyCODONE-acetaminophen (PERCOCET) 10-325 MG per tablet   Oral   Take 1 tablet by mouth every 6 (six) hours as needed. For pain         . potassium chloride (K-DUR) 10 MEQ tablet   Oral   Take 1 tablet (10 mEq total) by mouth daily.   30 tablet   6   . risperiDONE (RISPERDAL) 0.5 MG tablet   Oral   Take 0.5 mg by mouth 2 (two) times daily.         . simvastatin (ZOCOR) 20 MG tablet   Oral   Take 20 mg by mouth at bedtime.          . Thiamine HCl (VITAMIN B-1) 250 MG tablet   Oral   Take 250 mg by mouth daily.             BP 113/44  Pulse 67  Temp(Src) 98.7 F (37.1 C) (Oral)  Resp 23  SpO2 96%  Physical Exam CONSTITUTIONAL: elderly, frail HEAD: Normocephalic/atraumatic, no signs of trauma EYES: EOMI ENMT: Mucous membranes moist NECK: supple no meningeal signs SPINE:entire spine nontender, No  bruising/crepitance/stepoffs noted to spine CV: S1/S2 noted, LUNGS: Lungs are clear to auscultation bilaterally, no apparent distress ABDOMEN: soft, nontender, no rebound or guarding Rectal - stool color black.  Hemoccult positive.  Chaperone present GU:no cva tenderness NEURO: Pt is drowsy but easily arousable.  He follows all commands and recognizes family without difficulty.  He moves all extremities x4 EXTREMITIES: pulses normal, full ROM SKIN: pale   ED Course  Procedures  Labs Reviewed  CBC - Abnormal; Notable for the following:    RBC 3.00 (*)    Hemoglobin 7.5 (*)    HCT 23.9 (*)    MCH 25.0 (*)    RDW 19.2 (*)    All other components within normal limits  BASIC METABOLIC PANEL - Abnormal; Notable for the following:    CO2 34 (*)    Glucose, Bld 124 (*)    BUN 61 (*)    GFR calc non Af Amer 56 (*)    GFR calc Af Amer 65 (*)    All other components within normal limits  PRO B NATRIURETIC PEPTIDE - Abnormal; Notable for the following:    Pro B Natriuretic peptide (BNP) 741.6 (*)    All other components within normal limits  OCCULT BLOOD, POC DEVICE - Abnormal; Notable for the following:    Fecal Occult Bld POSITIVE (*)    All other components within normal limits  URINALYSIS, ROUTINE W REFLEX MICROSCOPIC  POCT I-STAT TROPONIN I  TYPE AND SCREEN  PREPARE RBC (CROSSMATCH)   Dg Chest 2 View  01/22/2013   *RADIOLOGY REPORT*  Clinical Data: Loss of consciousness.  CHEST - 2 VIEW  Comparison: Chest x-ray 02/03/2012.  Findings: Lung volumes are normal.  No consolidative airspace disease.  No pleural effusions.  No pneumothorax.  No pulmonary nodule or mass noted.  Pulmonary vasculature and the cardiomediastinal silhouette are within normal limits. Atherosclerosis of the thoracic aorta.  Status post median sternotomy.  IMPRESSION: 1. No radiographic evidence of acute cardiopulmonary disease. 2.  Atherosclerosis.   Original Report Authenticated By: Trudie Reed, M.D.  11:36  PM Pt with delirium today.  He appears to have acute GI  bleed.  Family did not notice any recent evidence of GI bleed but he is anemic and +hemoccult with melena.  He does not appear to be on anticoagulants.  protonix ordered.  Pt has no objection to receiving blood products.  He will need admission.  For his mental status, he seems more interactive and family reports this seems to be improving.   11:39 PM I spoke to dr ganem with GI He will likely perform EGD in the morning  11:57 PM D/w dr Adela Glimpse, will admit to stepdown Family updated on plan    MDM  Nursing notes including past medical history and social history reviewed and considered in documentation xrays reviewed and considered Labs/vital reviewed and considered        Date: 01/22/2013  Rate: 72  Rhythm: normal sinus rhythm  QRS Axis: normal  Intervals: normal  ST/T Wave abnormalities: nonspecific ST changes  Conduction Disutrbances:nonspecific intraventricular conduction delay  Narrative Interpretation:   Old EKG Reviewed: unchanged    Joya Gaskins, MD 01/22/13 2357

## 2013-01-22 NOTE — ED Notes (Signed)
Patient returned from xray.

## 2013-01-22 NOTE — ED Notes (Signed)
Lower blood pressures were taken while pt was on side.  Pressure of 112/46 was recorded while pt was on back.

## 2013-01-22 NOTE — ED Notes (Signed)
Patient here from home, patient's family stated to EMS that he had a syncopal episode.  Family stated he slumped over in chair.  Patient was hard to arouse.  Upon EMS arrival, patient was awake and at baseline.  Patient does have some dementia.

## 2013-01-23 ENCOUNTER — Inpatient Hospital Stay (HOSPITAL_COMMUNITY): Payer: Medicare Other

## 2013-01-23 ENCOUNTER — Encounter (HOSPITAL_COMMUNITY)
Admission: EM | Disposition: A | Discharge status: Home Health | Payer: Self-pay | Source: Home / Self Care | Attending: Critical Care Medicine

## 2013-01-23 ENCOUNTER — Encounter (HOSPITAL_COMMUNITY): Payer: Self-pay | Admitting: Internal Medicine

## 2013-01-23 DIAGNOSIS — D649 Anemia, unspecified: Secondary | ICD-10-CM

## 2013-01-23 DIAGNOSIS — K922 Gastrointestinal hemorrhage, unspecified: Secondary | ICD-10-CM | POA: Diagnosis present

## 2013-01-23 DIAGNOSIS — R410 Disorientation, unspecified: Secondary | ICD-10-CM | POA: Diagnosis present

## 2013-01-23 DIAGNOSIS — I2510 Atherosclerotic heart disease of native coronary artery without angina pectoris: Secondary | ICD-10-CM

## 2013-01-23 DIAGNOSIS — R578 Other shock: Secondary | ICD-10-CM | POA: Diagnosis not present

## 2013-01-23 DIAGNOSIS — F29 Unspecified psychosis not due to a substance or known physiological condition: Secondary | ICD-10-CM

## 2013-01-23 DIAGNOSIS — I959 Hypotension, unspecified: Secondary | ICD-10-CM | POA: Diagnosis present

## 2013-01-23 HISTORY — PX: ESOPHAGOGASTRODUODENOSCOPY: SHX5428

## 2013-01-23 LAB — GLUCOSE, CAPILLARY: Glucose-Capillary: 92 mg/dL (ref 70–99)

## 2013-01-23 LAB — HEMOGLOBIN AND HEMATOCRIT, BLOOD
HCT: 25.6 % — ABNORMAL LOW (ref 39.0–52.0)
HCT: 24.5 % — ABNORMAL LOW (ref 39.0–52.0)
HCT: 19.6 % — ABNORMAL LOW (ref 39.0–52.0)
HCT: 28.9 % — ABNORMAL LOW (ref 39.0–52.0)
Hemoglobin: 8.8 g/dL — ABNORMAL LOW (ref 13.0–17.0)
Hemoglobin: 6.8 g/dL — CL (ref 13.0–17.0)

## 2013-01-23 LAB — TROPONIN I: Troponin I: 0.99 ng/mL (ref <0.30)

## 2013-01-23 LAB — URINALYSIS, ROUTINE W REFLEX MICROSCOPIC
Glucose, UA: NEGATIVE mg/dL
Ketones, ur: NEGATIVE mg/dL
Nitrite: NEGATIVE
Protein, ur: NEGATIVE mg/dL
Urobilinogen, UA: 0.2 mg/dL (ref 0.0–1.0)

## 2013-01-23 LAB — HEPATIC FUNCTION PANEL
Alkaline Phosphatase: 40 U/L (ref 39–117)
Total Bilirubin: 0.1 mg/dL — ABNORMAL LOW (ref 0.3–1.2)

## 2013-01-23 LAB — URINE MICROSCOPIC-ADD ON

## 2013-01-23 LAB — ABO/RH: ABO/RH(D): O POS

## 2013-01-23 LAB — BLOOD GAS, ARTERIAL
Acid-base deficit: 3.8 mmol/L — ABNORMAL HIGH (ref 0.0–2.0)
FIO2: 0.5 %
MECHVT: 500 mL
Patient temperature: 98.6
RATE: 14 resp/min
TCO2: 22.2 mmol/L (ref 0–100)
pCO2 arterial: 39.8 mmHg (ref 35.0–45.0)

## 2013-01-23 LAB — PHOSPHORUS: Phosphorus: 3.3 mg/dL (ref 2.3–4.6)

## 2013-01-23 LAB — MAGNESIUM: Magnesium: 1.6 mg/dL (ref 1.5–2.5)

## 2013-01-23 LAB — COMPREHENSIVE METABOLIC PANEL
ALT: 9 U/L (ref 0–53)
Albumin: 2.1 g/dL — ABNORMAL LOW (ref 3.5–5.2)
Calcium: 6.9 mg/dL — ABNORMAL LOW (ref 8.4–10.5)
GFR calc Af Amer: 76 mL/min — ABNORMAL LOW (ref >90)
Glucose, Bld: 108 mg/dL — ABNORMAL HIGH (ref 70–99)
Sodium: 146 mEq/L — ABNORMAL HIGH (ref 135–145)
Total Protein: 3.7 g/dL — ABNORMAL LOW (ref 6.0–8.3)

## 2013-01-23 LAB — PREPARE RBC (CROSSMATCH)

## 2013-01-23 SURGERY — EGD (ESOPHAGOGASTRODUODENOSCOPY)
Anesthesia: Moderate Sedation

## 2013-01-23 MED ORDER — SODIUM CHLORIDE 0.9 % IJ SOLN
PREFILLED_SYRINGE | INTRAMUSCULAR | Status: DC | PRN
  Administered 2013-01-23: 09:00:00

## 2013-01-23 MED ORDER — FENTANYL CITRATE 0.05 MG/ML IJ SOLN
INTRAMUSCULAR | Status: AC
  Filled 2013-01-23: qty 4

## 2013-01-23 MED ORDER — SODIUM CHLORIDE 0.9 % IV BOLUS (SEPSIS)
500.0000 mL | Freq: Once | INTRAVENOUS | Status: AC
  Administered 2013-01-23: 07:00:00 via INTRAVENOUS

## 2013-01-23 MED ORDER — FENTANYL CITRATE 0.05 MG/ML IJ SOLN
25.0000 ug | INTRAMUSCULAR | Status: DC | PRN
  Administered 2013-01-23: 100 ug via INTRAVENOUS
  Filled 2013-01-23: qty 4

## 2013-01-23 MED ORDER — ETOMIDATE 2 MG/ML IV SOLN
INTRAVENOUS | Status: AC
  Administered 2013-01-23: 20 mg
  Filled 2013-01-23: qty 10

## 2013-01-23 MED ORDER — MIDAZOLAM HCL 2 MG/2ML IJ SOLN
INTRAMUSCULAR | Status: AC
  Administered 2013-01-23: 2 mg via INTRAVENOUS
  Filled 2013-01-23: qty 4

## 2013-01-23 MED ORDER — PANTOPRAZOLE SODIUM 40 MG IV SOLR
40.0000 mg | Freq: Once | INTRAVENOUS | Status: DC
  Filled 2013-01-23: qty 40

## 2013-01-23 MED ORDER — SODIUM CHLORIDE 0.9 % IV SOLN
INTRAVENOUS | Status: DC
  Administered 2013-01-23 (×2): via INTRAVENOUS

## 2013-01-23 MED ORDER — DEXMEDETOMIDINE HCL IN NACL 200 MCG/50ML IV SOLN
0.2000 ug/kg/h | INTRAVENOUS | Status: DC
  Filled 2013-01-23: qty 50

## 2013-01-23 MED ORDER — SODIUM CHLORIDE 0.9 % IJ SOLN
3.0000 mL | Freq: Two times a day (BID) | INTRAMUSCULAR | Status: DC
  Administered 2013-01-23 – 2013-01-26 (×9): 3 mL via INTRAVENOUS
  Administered 2013-01-27 – 2013-01-29 (×2): 10 mL via INTRAVENOUS
  Administered 2013-01-29 – 2013-02-01 (×5): 3 mL via INTRAVENOUS

## 2013-01-23 MED ORDER — MIDAZOLAM HCL 2 MG/2ML IJ SOLN
1.0000 mg | INTRAMUSCULAR | Status: DC | PRN
  Administered 2013-01-23 (×2): 1 mg via INTRAVENOUS
  Administered 2013-01-23 – 2013-01-24 (×3): 2 mg via INTRAVENOUS
  Administered 2013-01-24: 1 mg via INTRAVENOUS
  Administered 2013-01-24 (×2): 2 mg via INTRAVENOUS
  Administered 2013-01-24: 1 mg via INTRAVENOUS
  Administered 2013-01-24: 2 mg via INTRAVENOUS
  Administered 2013-01-24: 1 mg via INTRAVENOUS
  Filled 2013-01-23 (×11): qty 2

## 2013-01-23 MED ORDER — DEXTROSE 5 % IV SOLN
1.0000 g | INTRAVENOUS | Status: DC
  Administered 2013-01-24 – 2013-01-26 (×3): 1 g via INTRAVENOUS
  Filled 2013-01-23 (×4): qty 10

## 2013-01-23 MED ORDER — SODIUM CHLORIDE 0.9 % IV SOLN: INTRAVENOUS | Status: AC

## 2013-01-23 MED ORDER — ACETAMINOPHEN 650 MG RE SUPP: 650.0000 mg | Freq: Four times a day (QID) | RECTAL | Status: DC | PRN

## 2013-01-23 MED ORDER — PHENYLEPHRINE HCL 10 MG/ML IJ SOLN
30.0000 ug/min | INTRAVENOUS | Status: DC
  Filled 2013-01-23: qty 1

## 2013-01-23 MED ORDER — MIDAZOLAM HCL 5 MG/ML IJ SOLN
INTRAMUSCULAR | Status: AC
  Filled 2013-01-23: qty 2

## 2013-01-23 MED ORDER — CHLORHEXIDINE GLUCONATE 0.12 % MT SOLN
15.0000 mL | Freq: Two times a day (BID) | OROMUCOSAL | Status: DC
  Administered 2013-01-23 – 2013-01-28 (×11): 15 mL via OROMUCOSAL
  Filled 2013-01-23 (×11): qty 15

## 2013-01-23 MED ORDER — ACETAMINOPHEN 325 MG PO TABS: 650.0000 mg | ORAL_TABLET | Freq: Four times a day (QID) | ORAL | Status: DC | PRN

## 2013-01-23 MED ORDER — MIDAZOLAM HCL 2 MG/2ML IJ SOLN: 2.0000 mg | Freq: Once | INTRAMUSCULAR | Status: AC

## 2013-01-23 MED ORDER — ONDANSETRON HCL 4 MG PO TABS: 4.0000 mg | ORAL_TABLET | Freq: Four times a day (QID) | ORAL | Status: DC | PRN

## 2013-01-23 MED ORDER — FENTANYL CITRATE 0.05 MG/ML IJ SOLN
25.0000 ug | INTRAMUSCULAR | Status: DC | PRN
  Administered 2013-01-23 – 2013-01-24 (×8): 50 ug via INTRAVENOUS
  Administered 2013-01-26 – 2013-01-27 (×3): 25 ug via INTRAVENOUS
  Administered 2013-01-27 (×2): 50 ug via INTRAVENOUS
  Administered 2013-01-28: 25 ug via INTRAVENOUS
  Administered 2013-01-28: 50 ug via INTRAVENOUS
  Filled 2013-01-23 (×14): qty 2

## 2013-01-23 MED ORDER — HALOPERIDOL LACTATE 5 MG/ML IJ SOLN: 2.0000 mg | INTRAMUSCULAR | Status: DC | PRN

## 2013-01-23 MED ORDER — HYDROCODONE-ACETAMINOPHEN 5-325 MG PO TABS
1.0000 | ORAL_TABLET | ORAL | Status: DC | PRN
  Administered 2013-01-23: 1 via ORAL
  Filled 2013-01-23: qty 1

## 2013-01-23 MED ORDER — SODIUM CHLORIDE 0.9 % IV SOLN
500.0000 mL | Freq: Once | INTRAVENOUS | Status: AC
  Administered 2013-01-23: 500 mL via INTRAVENOUS

## 2013-01-23 MED ORDER — ACETAMINOPHEN 325 MG PO TABS
650.0000 mg | ORAL_TABLET | Freq: Once | ORAL | Status: AC
  Administered 2013-01-23: 650 mg via ORAL
  Filled 2013-01-23: qty 2

## 2013-01-23 MED ORDER — PANTOPRAZOLE SODIUM 40 MG IV SOLR
8.0000 mg/h | INTRAVENOUS | Status: DC
  Filled 2013-01-23: qty 80

## 2013-01-23 MED ORDER — ONDANSETRON HCL 4 MG/2ML IJ SOLN: 4.0000 mg | Freq: Four times a day (QID) | INTRAMUSCULAR | Status: DC | PRN

## 2013-01-23 MED ORDER — SODIUM CHLORIDE 0.9 % IV BOLUS (SEPSIS)
500.0000 mL | Freq: Once | INTRAVENOUS | Status: AC
  Administered 2013-01-23: 500 mL via INTRAVENOUS
  Administered 2013-01-23: 1000 mL via INTRAVENOUS

## 2013-01-23 MED ORDER — SODIUM CHLORIDE 0.9 % IV SOLN
8.0000 mg/h | INTRAVENOUS | Status: DC
  Administered 2013-01-23 – 2013-01-27 (×8): 8 mg/h via INTRAVENOUS
  Filled 2013-01-23 (×21): qty 80

## 2013-01-23 MED ORDER — BUTAMBEN-TETRACAINE-BENZOCAINE 2-2-14 % EX AERO
INHALATION_SPRAY | CUTANEOUS | Status: DC | PRN
  Administered 2013-01-23: 1 via TOPICAL

## 2013-01-23 MED ORDER — RISPERIDONE 0.5 MG PO TABS
0.5000 mg | ORAL_TABLET | Freq: Two times a day (BID) | ORAL | Status: DC
  Filled 2013-01-23 (×4): qty 1

## 2013-01-23 MED ORDER — INSULIN ASPART 100 UNIT/ML ~~LOC~~ SOLN
0.0000 [IU] | SUBCUTANEOUS | Status: DC
  Administered 2013-01-23 – 2013-01-26 (×8): 2 [IU] via SUBCUTANEOUS

## 2013-01-23 MED ORDER — SODIUM CHLORIDE 0.9 % IV BOLUS (SEPSIS)
500.0000 mL | Freq: Once | INTRAVENOUS | Status: AC
  Administered 2013-01-23: 500 mL via INTRAVENOUS

## 2013-01-23 MED ORDER — DIPHENHYDRAMINE HCL 25 MG PO CAPS
25.0000 mg | ORAL_CAPSULE | Freq: Once | ORAL | Status: AC
  Administered 2013-01-23: 25 mg via ORAL
  Filled 2013-01-23: qty 1

## 2013-01-23 MED ORDER — SODIUM CHLORIDE 0.9 % IV SOLN
INTRAVENOUS | Status: DC
  Administered 2013-01-26: 18:00:00 via INTRAVENOUS

## 2013-01-23 MED ORDER — VECURONIUM BROMIDE 10 MG IV SOLR
INTRAVENOUS | Status: AC
  Filled 2013-01-23: qty 10

## 2013-01-23 MED ORDER — MIDAZOLAM HCL 10 MG/2ML IJ SOLN
INTRAMUSCULAR | Status: DC | PRN
  Administered 2013-01-23 (×2): 1 mg via INTRAVENOUS
  Administered 2013-01-23: 2 mg via INTRAVENOUS

## 2013-01-23 MED ORDER — FENTANYL CITRATE 0.05 MG/ML IJ SOLN
INTRAMUSCULAR | Status: DC | PRN
  Administered 2013-01-23: 25 ug via INTRAVENOUS

## 2013-01-23 MED ORDER — PHENYLEPHRINE HCL 10 MG/ML IJ SOLN
30.0000 ug/min | INTRAVENOUS | Status: DC
  Administered 2013-01-23: 100 ug/min via INTRAVENOUS
  Administered 2013-01-23: 200 ug/min via INTRAVENOUS
  Administered 2013-01-24: 50.133 ug/min via INTRAVENOUS
  Filled 2013-01-23 (×5): qty 4

## 2013-01-23 MED ORDER — EPINEPHRINE HCL 0.1 MG/ML IJ SOSY
PREFILLED_SYRINGE | INTRAMUSCULAR | Status: AC
  Filled 2013-01-23: qty 10

## 2013-01-23 MED ORDER — BIOTENE DRY MOUTH MT LIQD
15.0000 mL | Freq: Two times a day (BID) | OROMUCOSAL | Status: DC
  Administered 2013-01-23 – 2013-01-28 (×12): 15 mL via OROMUCOSAL

## 2013-01-23 MED ORDER — WHITE PETROLATUM GEL
Status: AC
  Administered 2013-01-23: 0.2
  Filled 2013-01-23: qty 5

## 2013-01-23 MED ORDER — MIDAZOLAM HCL 2 MG/2ML IJ SOLN
INTRAMUSCULAR | Status: AC
  Administered 2013-01-23: 2 mg via INTRAVENOUS
  Filled 2013-01-23: qty 2

## 2013-01-23 NOTE — H&P (View-Only) (Signed)
Referring Provider: Dr. Doutova Primary Care Physician:  BUTLER, CYNTHIA, DO Primary Gastroenterologist:  Unassigned  Reason for Consultation:  GI bleed; Melena  HPI: [name] is a 80 y.o. male with dementia who family reports has been coughing recently but denies any vomiting or retching prior to coming into the hospital. He presented with hypotension in the 90's systolic with episodes of black stools. Hgb 7.5. BUN. BPs dropped into the 60's and he continued to have dark tarry stools. He has since been placed on neosynephrine drip for BP support. S/P 3 U PRBCs with Hgb 8.8 following transfusions. No history of hematemesis or hematochezia. Patient is demented and cannot give any history to me at this time.    Past Medical History  Diagnosis Date  . Coronary atherosclerosis of native coronary artery     Multivessel  . Essential hypertension, benign   . Mixed hyperlipidemia   . Erectile dysfunction   . Osteoarthritis   . Myocardial infarction     IMI 1987 and PLMI 1997  . Rheumatoid arthritis, adult     Past Surgical History  Procedure Laterality Date  . Coronary artery bypass graft  1987  . Carpal tunnel release    . Rotator cuff repair    . Neck surgery      Prior to Admission medications   Medication Sig Start Date End Date Taking? Authorizing Provider  aspirin 325 MG tablet Take 325 mg by mouth daily.   Yes Historical Provider, MD  atenolol (TENORMIN) 25 MG tablet Take 25 mg by mouth daily.    Yes Historical Provider, MD  citalopram (CELEXA) 10 MG tablet Take 20 mg by mouth at bedtime.  02/05/12 02/04/13 Yes Corinna L Sullivan, MD  ezetimibe (ZETIA) 10 MG tablet Take 10 mg by mouth daily.     Yes Historical Provider, MD  felodipine (PLENDIL) 5 MG 24 hr tablet Take 5 mg by mouth daily.    Yes Historical Provider, MD  furosemide (LASIX) 20 MG tablet Take 1 tablet (20 mg total) by mouth daily. 01/05/13  Yes Samuel G McDowell, MD  irbesartan (AVAPRO) 75 MG tablet Take 75 mg by  mouth at bedtime.   Yes Historical Provider, MD  oxyCODONE-acetaminophen (PERCOCET) 10-325 MG per tablet Take 1 tablet by mouth every 6 (six) hours as needed. For pain   Yes Historical Provider, MD  potassium chloride (K-DUR) 10 MEQ tablet Take 1 tablet (10 mEq total) by mouth daily. 05/28/12  Yes Robert M Rothbart, MD  risperiDONE (RISPERDAL) 0.5 MG tablet Take 0.5 mg by mouth 2 (two) times daily. 02/05/12  Yes Corinna L Sullivan, MD  simvastatin (ZOCOR) 20 MG tablet Take 20 mg by mouth at bedtime.    Yes Historical Provider, MD  Thiamine HCl (VITAMIN B-1) 250 MG tablet Take 250 mg by mouth daily.     Yes Historical Provider, MD    Scheduled Meds: . cefTRIAXone (ROCEPHIN) IVPB 1 gram/50 mL D5W  1 g Intravenous Q24H  . pantoprazole (PROTONIX) IV  40 mg Intravenous Once  . risperiDONE  0.5 mg Oral BID  . sodium chloride  3 mL Intravenous Q12H   Continuous Infusions: . sodium chloride    . dexmedetomidine    . pantoprozole (PROTONIX) infusion    . phenylephrine (NEO-SYNEPHRINE) Adult infusion 100 mcg/min (01/23/13 0735)   PRN Meds:.acetaminophen, acetaminophen, ondansetron (ZOFRAN) IV, ondansetron  Allergies as of 01/22/2013  . (No Known Allergies)    Family History  Problem Relation Age of Onset  .   Coronary artery disease Father     Died age 70 with MI  . Diabetes type II Sister     History   Social History  . Marital Status: Married    Spouse Name: N/A    Number of Children: N/A  . Years of Education: N/A   Occupational History  . 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 Smoking status: Current Every Day Smoker -- 1.00 packs/day for 60 years    Types: Cigarettes    Last Attempt to Quit: 07/14/2011  . Smokeless tobacco: Never Used  . Alcohol Use: No     Comment: one beer every 6 months  . Drug Use: No  . Sexually Active: Not on file   Other Topics Concern  . Not on file   Social History Narrative  . No narrative  on file    Review of Systems: All negative except as stated above in HPI.  Physical Exam: Vital signs: Filed Vitals:   01/23/13 0815  BP: 126/50  Pulse: 61  Temp: 98.7  Resp: 24   Last BM Date: 01/23/13 General:   Lethargic, demented, awake, no acute distress HEENT: anicteric; oropharynx clear Neck: nontender Lungs:  Clear throughout to auscultation.   No wheezes, crackles, or rhonchi. No acute distress. Heart:  Regular rate and rhythm; no murmurs, clicks, rubs,  or gallops. Abdomen: LLQ tenderness with guarding, soft, nondistended, +BS  Rectal:  Deferred Ext: no edema  GI:  Lab Results:  Recent Labs  01/22/13 2220 01/23/13 0657  WBC 9.0  --   HGB 7.5* 8.8*  HCT 23.9* 25.6*  PLT 183  --    BMET  Recent Labs  01/22/13 2220  NA 140  K 4.1  CL 100  CO2 34*  GLUCOSE 124*  BUN 61*  CREATININE 1.20  CALCIUM 8.8   LFT  Recent Labs  01/22/13 2356  PROT 5.2*  ALBUMIN 2.9*  AST 14  ALT 11  ALKPHOS 40  BILITOT 0.1*  BILIDIR <0.1  IBILI NOT CALCULATED   PT/INR  Recent Labs  01/22/13 2356  LABPROT 13.3  INR 1.02     Studies/Results: Dg Chest 2 View  01/22/2013   *RADIOLOGY REPORT*  Clinical Data: Loss of consciousness.  CHEST - 2 VIEW  Comparison: Chest x-ray 02/03/2012.  Findings: Lung volumes are normal.  No consolidative airspace disease.  No pleural effusions.  No pneumothorax.  No pulmonary nodule or mass noted.  Pulmonary vasculature and the cardiomediastinal silhouette are within normal limits. Atherosclerosis of the thoracic aorta.  Status post median sternotomy.  IMPRESSION: 1. No radiographic evidence of acute cardiopulmonary disease. 2.  Atherosclerosis.   Original Report Authenticated By: Daniel Entrikin, M.D.   Dg Chest Port 1 View  01/23/2013   *RADIOLOGY REPORT*  Clinical Data: Status post central line placement.  PORTABLE CHEST - 1 VIEW  Comparison: Chest x-ray 01/22/2013.  Findings: There is a right-sided internal jugular central  venous catheter with tip terminating in the distal right internal jugular vein.  No pneumothorax.  No acute consolidative airspace disease. No pleural effusions.  Pulmonary vasculature is within normal limits.  Heart size is normal.  Mediastinal contours are unremarkable.  Atherosclerosis in the thoracic aorta.  Status post median sternotomy.  IMPRESSION: 1.  Tip of right internal jugular central venous catheter appears to be in the distal right internal jugular vein.  No pneumothorax. 2.  No radiographic evidence of acute cardiopulmonary   disease. 3.  Atherosclerosis.   Original Report Authenticated By: Daniel Entrikin, M.D.    Impression/Plan: 80 yo with severe upper GI bleed with melena, hypotension needing pressors. History concerning for a bleeding peptic ulcer vs Mallory Weiss tear vs Dieulafoy lesion. Doubt variceal bleed. Volume resuscitate. Bedside EGD. Protonix drip. NPO. On CCM service.   

## 2013-01-23 NOTE — Procedures (Signed)
Central Venous Catheter Insertion Procedure Note [name] [id number] 1933-04-19  Procedure: Insertion of Central Venous Catheter Indications: Drug and/or fluid administration  Procedure Details Consent: Unable to obtain consent because of altered level of consciousness. Time Out: Verified patient identification, verified procedure, site/side was marked, verified correct patient position, special equipment/implants available, medications/allergies/relevent history reviewed, required imaging and test results available.  Performed  Maximum sterile technique was used including antiseptics, cap, gloves, gown, hand hygiene and mask. Skin prep: Chlorhexidine; local anesthetic administered A antimicrobial bonded/coated triple lumen catheter was placed in the right internal jugular vein introducer using sterile technique. Secured at 19cm with good flush and blood return  Evaluation Blood flow good Complications: No apparent complications Patient did tolerate procedure well. Chest X-ray ordered to verify placement.  CXR: pending.  [name] S. 01/23/2013, 4:55 PM

## 2013-01-23 NOTE — Procedures (Signed)
Central Venous Catheter Insertion Procedure Note SHAHEEN MENDE [id number] 04/24/1933  Procedure: Insertion of Central Venous Catheter Indications: Drug and/or fluid administration  Procedure Details Consent: Unable to obtain consent because of emergent medical necessity. Time Out: Verified patient identification, verified procedure, site/side was marked, verified correct patient position, special equipment/implants available, medications/allergies/relevent history reviewed, required imaging and test results available.  Performed  Maximum sterile technique was used including antiseptics, cap, gloves, gown, hand hygiene, mask and sheet. Skin prep: Chlorhexidine; local anesthetic administered A single lumen Cordis catheter was placed in the right internal jugular vein using the Seldinger technique.  Ultrasound was used to verify the patency of the vein and for real time needle guidance.  Evaluation Blood flow good Complications: No apparent complications Patient did tolerate procedure well. Chest X-ray ordered to verify placement.  CXR: normal.  MCQUAID, DOUGLAS 01/23/2013, 7:02 AM

## 2013-01-23 NOTE — Progress Notes (Signed)
CRITICAL VALUE ALERT  Critical value received: hem 6.8  Date of notification:  6/20  Time of notification:  1513  Critical value read back: yes  Nurse who received alert:  Ashley Jacobs RN  MD notified (1st page):  Elink MD  Time of first page:  1515

## 2013-01-23 NOTE — Brief Op Note (Addendum)
Large amount of red blood and dark-colored clots in the fundus of the stomach. Large Mallory-Weiss tear again noted. No definite evidence of active bleed at this time from the MWT. Unable to clear the fundus of the stomach. Bicap gold probe again used to cauterize MWT with good results. See endopro for details/recs.  Aggressive volume resuscitation and Protonix drip. If evidence of ongoing bleeding or recurrent bleeding, then would recommend angiogram with possible embolization by IR. Discussed with Dr. Craige Cotta. Discussed with family.

## 2013-01-23 NOTE — Op Note (Addendum)
  Indication: GI bleed; Melena   Procedure: EGD with bleeding control  Medications: Fentanyl 25 mcg IV, Versed 4 mg IV, Epinephrine:saline 1:10,000 U 3 cc submucosally  Findings: Endoscope was inserted into the oropharynx and the esophagus was intubated which was normal in its proximal and mid portion. In the distal esophagus at the GEJ was a moderately-sized [employer] tear that was oozing blood. The endoscope was advanced into the stomach where approximately 200 cc of motor oil-appearing blood with dark and maroon-colored blood clots were seen in the dependent portion of the stomach. The endoscope was inserted into the duodenum which was normal in appearance. The endoscope was withdrawn back into the stomach and retroflexion revealed a small hiatal hernia. The MWT could not be visualized on retroflexion due to blood products noted. The entire stomach could not be visualized due to blood products. No active bleeding was seen in the stomach. The MWT was again visualized and was actively bleeding. 3cc of epi mixture was injected with stopped the bleeding. Bicap gold probe was then used to cauterize the MWT with success.  Impression: 1. Bleeding Mallory-Weiss tear 2. Small hiatal hernia 3. Large amount of old blood in stomach preventing complete visualization of the stomach  Plan: Protonix drip; NPO; Volume resuscitate  Technical issues prevented photodocumentation.

## 2013-01-23 NOTE — Progress Notes (Signed)
Stat Ct of head ordered. Made Kim in radiology aware that the Pt needs an IV restart  and is receiving blood. Two RN's attempted and is now unsuccessful.  Will monitor.

## 2013-01-23 NOTE — Consult Note (Signed)
Referring Provider: Dr. Adela Glimpse Primary Care Physician:  Samuel Jester, DO Primary Gastroenterologist:  Gentry Fitz  Reason for Consultation:  GI bleed; Melena  HPI: [name] is a 79 y.o. male with dementia who family reports has been coughing recently but denies any vomiting or retching prior to coming into the hospital. He presented with hypotension in the 90's systolic with episodes of black stools. Hgb 7.5. BUN. BPs dropped into the 60's and he continued to have dark tarry stools. He has since been placed on neosynephrine drip for BP support. S/P 3 U PRBCs with Hgb 8.8 following transfusions. No history of hematemesis or hematochezia. Patient is demented and cannot give any history to me at this time.    Past Medical History  Diagnosis Date  . Coronary atherosclerosis of native coronary artery     Multivessel  . Essential hypertension, benign   . Mixed hyperlipidemia   . Erectile dysfunction   . Osteoarthritis   . Myocardial infarction     IMI 1987 and PLMI 1997  . Rheumatoid arthritis, adult     Past Surgical History  Procedure Laterality Date  . Coronary artery bypass graft  1987  . Carpal tunnel release    . Rotator cuff repair    . Neck surgery      Prior to Admission medications   Medication Sig Start Date End Date Taking? Authorizing Provider  aspirin 325 MG tablet Take 325 mg by mouth daily.   Yes Historical Provider, MD  atenolol (TENORMIN) 25 MG tablet Take 25 mg by mouth daily.    Yes Historical Provider, MD  citalopram (CELEXA) 10 MG tablet Take 20 mg by mouth at bedtime.  02/05/12 02/04/13 Yes Christiane Ha, MD  ezetimibe (ZETIA) 10 MG tablet Take 10 mg by mouth daily.     Yes Historical Provider, MD  felodipine (PLENDIL) 5 MG 24 hr tablet Take 5 mg by mouth daily.    Yes Historical Provider, MD  furosemide (LASIX) 20 MG tablet Take 1 tablet (20 mg total) by mouth daily. 01/05/13  Yes Jonelle Sidle, MD  irbesartan (AVAPRO) 75 MG tablet Take 75 mg by  mouth at bedtime.   Yes Historical Provider, MD  oxyCODONE-acetaminophen (PERCOCET) 10-325 MG per tablet Take 1 tablet by mouth every 6 (six) hours as needed. For pain   Yes Historical Provider, MD  potassium chloride (K-DUR) 10 MEQ tablet Take 1 tablet (10 mEq total) by mouth daily. 05/28/12  Yes Kathlen Brunswick, MD  risperiDONE (RISPERDAL) 0.5 MG tablet Take 0.5 mg by mouth 2 (two) times daily. 02/05/12  Yes Christiane Ha, MD  simvastatin (ZOCOR) 20 MG tablet Take 20 mg by mouth at bedtime.    Yes Historical Provider, MD  Thiamine HCl (VITAMIN B-1) 250 MG tablet Take 250 mg by mouth daily.     Yes Historical Provider, MD    Scheduled Meds: . cefTRIAXone (ROCEPHIN) IVPB 1 gram/50 mL D5W  1 g Intravenous Q24H  . pantoprazole (PROTONIX) IV  40 mg Intravenous Once  . risperiDONE  0.5 mg Oral BID  . sodium chloride  3 mL Intravenous Q12H   Continuous Infusions: . sodium chloride    . dexmedetomidine    . pantoprozole (PROTONIX) infusion    . phenylephrine (NEO-SYNEPHRINE) Adult infusion 100 mcg/min (01/23/13 0735)   PRN Meds:.acetaminophen, acetaminophen, ondansetron (ZOFRAN) IV, ondansetron  Allergies as of 01/22/2013  . (No Known Allergies)    Family History  Problem Relation Age of Onset  .  Coronary artery disease Father     Died age 62 with MI  . Diabetes type II Sister     History   Social History  . Marital Status: Married    Spouse Name: N/A    Number of Children: N/A  . Years of Education: N/A   Occupational History  . retired     former Tax adviser; former Office manager   Social History Main Topics  . Smoking status: Current Every Day Smoker -- 1.00 packs/day for 60 years    Types: Cigarettes    Last Attempt to Quit: 07/14/2011  . Smokeless tobacco: Never Used  . Alcohol Use: No     Comment: one beer every 6 months  . Drug Use: No  . Sexually Active: Not on file   Other Topics Concern  . Not on file   Social History Narrative  . No narrative  on file    Review of Systems: All negative except as stated above in HPI.  Physical Exam: Vital signs: Filed Vitals:   01/23/13 0815  BP: 126/50  Pulse: 61  Temp: 98.7  Resp: 24   Last BM Date: 01/23/13 General:   Lethargic, demented, awake, no acute distress HEENT: anicteric; oropharynx clear Neck: nontender Lungs:  Clear throughout to auscultation.   No wheezes, crackles, or rhonchi. No acute distress. Heart:  Regular rate and rhythm; no murmurs, clicks, rubs,  or gallops. Abdomen: LLQ tenderness with guarding, soft, nondistended, +BS  Rectal:  Deferred Ext: no edema  GI:  Lab Results:  Recent Labs  01/22/13 2220 01/23/13 0657  WBC 9.0  --   HGB 7.5* 8.8*  HCT 23.9* 25.6*  PLT 183  --    BMET  Recent Labs  01/22/13 2220  NA 140  K 4.1  CL 100  CO2 34*  GLUCOSE 124*  BUN 61*  CREATININE 1.20  CALCIUM 8.8   LFT  Recent Labs  01/22/13 2356  PROT 5.2*  ALBUMIN 2.9*  AST 14  ALT 11  ALKPHOS 40  BILITOT 0.1*  BILIDIR <0.1  IBILI NOT CALCULATED   PT/INR  Recent Labs  01/22/13 2356  LABPROT 13.3  INR 1.02     Studies/Results: Dg Chest 2 View  01/22/2013   *RADIOLOGY REPORT*  Clinical Data: Loss of consciousness.  CHEST - 2 VIEW  Comparison: Chest x-ray 02/03/2012.  Findings: Lung volumes are normal.  No consolidative airspace disease.  No pleural effusions.  No pneumothorax.  No pulmonary nodule or mass noted.  Pulmonary vasculature and the cardiomediastinal silhouette are within normal limits. Atherosclerosis of the thoracic aorta.  Status post median sternotomy.  IMPRESSION: 1. No radiographic evidence of acute cardiopulmonary disease. 2.  Atherosclerosis.   Original Report Authenticated By: Trudie Reed, M.D.   Dg Chest Port 1 View  01/23/2013   *RADIOLOGY REPORT*  Clinical Data: Status post central line placement.  PORTABLE CHEST - 1 VIEW  Comparison: Chest x-ray 01/22/2013.  Findings: There is a right-sided internal jugular central  venous catheter with tip terminating in the distal right internal jugular vein.  No pneumothorax.  No acute consolidative airspace disease. No pleural effusions.  Pulmonary vasculature is within normal limits.  Heart size is normal.  Mediastinal contours are unremarkable.  Atherosclerosis in the thoracic aorta.  Status post median sternotomy.  IMPRESSION: 1.  Tip of right internal jugular central venous catheter appears to be in the distal right internal jugular vein.  No pneumothorax. 2.  No radiographic evidence of acute cardiopulmonary  disease. 3.  Atherosclerosis.   Original Report Authenticated By: Trudie Reed, M.D.    Impression/Plan: 80 yo with severe upper GI bleed with melena, hypotension needing pressors. History concerning for a bleeding peptic ulcer vs Mallory Weiss tear vs Dieulafoy lesion. Doubt variceal bleed. Volume resuscitate. Bedside EGD. Protonix drip. NPO. On CCM service.

## 2013-01-23 NOTE — Progress Notes (Signed)
CRITICAL VALUE ALERT  Critical value received:  troponin 0.62  Date of notification:  01/23/13  Time of notification:  1215  Critical value read back: yes  Nurse who received alert:  Ashley Jacobs RN  Responding MD:  Soot  Time MD responded:  1215

## 2013-01-23 NOTE — Op Note (Addendum)
[name] [facility] [address] [place], [address]   ENDOSCOPY PROCEDURE REPORT  PATIENT: [name], [name]  MR#: [id number] BIRTHDATE: 1932/11/04 , 80  yrs. old GENDER: Male  ENDOSCOPIST: Charlott Rakes, MD REFERRED QI:[id number] team  PROCEDURE DATE:  01/23/2013 PROCEDURE:   EGD w/ control of bleeding ASA CLASS:   Class V INDICATIONS:Hematochezia. MEDICATIONS: Versed 2 mg IV   ; (intubated and sedated)  TOPICAL ANESTHETIC: none  DESCRIPTION OF PROCEDURE:   After the risks benefits and alternatives of the procedure were thoroughly explained, informed consent was obtained.  The Pentax Gastroscope [id number]  endoscope was introduced through the mouth and advanced to the second portion of the duodenum , limited by Without limitations.   The instrument was slowly withdrawn as the mucosa was fully examined.     FINDINGS: The endoscope was inserted into the oropharynx and esophagus was intubated.  The gastroesophageal junction was noted to be 40 cm from the incisors. Red blood seen in the distal esophagus. The Mallory Weiss tear was again noted and fresh blood seen overlying part of it but could not definitively see an active bleeding site. Large amount of red blood and maroon-colored blood clots in the dependent portion of the stomach. Unable to completely visualize the fundus of the stomach due to blood clots that could not be irrigated and suctioned away. The endoscope was advanced to the duodenal bulb and second portion of duodenum where red blood was seen without any mucosal abnormality.  The endoscope was withdrawn back into the stomach and retroflexion showed a normal incisura angularis. Fundus limited by blood clots. Cardia not adequately seen due to blood clots. MWT examined carefully and due to recent bleeding UXL:KGMWNU 1:10,000 U mixture injected (7 cc total injected submucosally). A double-channel endoscope was inserted into the stomach but the  clots could not be suctioned. A bicap gold probe used to cauterize the MWT with successful results and no evidence of active bleeding from the MWT following epi injection and Bicap gold probe cautery.  COMPLICATIONS: None  ENDOSCOPIC IMPRESSION:     GI bleed - Large Mallory Weiss tear -suspect rebleeding from that but a fundic source still possible (clots prevented clearance of the fundus) - s/p control of bleeding with epi injection and Bicap gold probe cautery  RECOMMENDATIONS: If rebleeding occurs then needs angiogram and possible embolization by IR as next step; Continue Protonix drip   REPEAT EXAM: N/A  _______________________________ Charlott Rakes, MD eSigned:  Charlott Rakes, MD 01/23/2013 8:42 PM    CC:  PATIENT NAME:  [name], [name] MR#: [id number]

## 2013-01-23 NOTE — Significant Event (Signed)
Pt remains hypotensive, and now with BRBPR.  Will give an additional two units PRBC.  Nursing staff will notify GI.  Coralyn Helling, MD 01/23/2013, 5:21 PM

## 2013-01-23 NOTE — H&P (View-Only) (Signed)
PULMONARY  / CRITICAL CARE MEDICINE  Name: [name] MRN: [id number] DOB: 1932/08/15    ADMISSION DATE:  01/22/2013 CONSULTATION DATE:  01/22/2013  REFERRING MD :  Adela Glimpse PRIMARY SERVICE: PCCM  CHIEF COMPLAINT:  GI bleed, confusion  BRIEF PATIENT DESCRIPTION: 79 y/o male with dementia who is DNR was admitted on 6/20 through the [facility] ED with a GI bleed and confusion.  He developed combativeness and worsening hypotension so PCCM was consulted.  SIGNIFICANT EVENTS / STUDIES:    LINES / TUBES: 6/20 R IJ Cordis >>  CULTURES:   ANTIBIOTICS:   HISTORY OF PRESENT ILLNESS: 79 y/o male with dementia who is DNR was admitted on 6/20 through the [facility] ED with a GI bleed and confusion.  He developed combativeness and worsening hypotension so PCCM was consulted.   History is obtained by chart review as he is confused on my assessment.  He has had confusion, lethargy requiring his family to call EMS to bring him to the ED.  There he was found to be hypotensive and anemic with melanotic stools.  His mental status improved with resuscitation and he was admitted to SDU. Unfortunately he became more hypotensive and combative so PCCM was consulted.    I have spoken with his daughter and sister-in-law Avon Gully) on the phone and they request that we do what ever we can to help him short of CPR and mechanical ventilation.  They understand that he is critically ill and may not survive.  PAST MEDICAL HISTORY :  Past Medical History  Diagnosis Date  . Coronary atherosclerosis of native coronary artery     Multivessel  . Essential hypertension, benign   . Mixed hyperlipidemia   . Erectile dysfunction   . Osteoarthritis   . Myocardial infarction     IMI 1987 and PLMI 1997  . Rheumatoid arthritis, adult    Past Surgical History  Procedure Laterality Date  . Coronary artery bypass graft  1987  . Carpal tunnel release    . Rotator cuff repair    . Neck surgery     Prior to Admission medications    Medication Sig Start Date End Date Taking? Authorizing Provider  aspirin 325 MG tablet Take 325 mg by mouth daily.   Yes Historical Provider, MD  atenolol (TENORMIN) 25 MG tablet Take 25 mg by mouth daily.    Yes Historical Provider, MD  citalopram (CELEXA) 10 MG tablet Take 20 mg by mouth at bedtime.  02/05/12 02/04/13 Yes Christiane Ha, MD  ezetimibe (ZETIA) 10 MG tablet Take 10 mg by mouth daily.     Yes Historical Provider, MD  felodipine (PLENDIL) 5 MG 24 hr tablet Take 5 mg by mouth daily.    Yes Historical Provider, MD  furosemide (LASIX) 20 MG tablet Take 1 tablet (20 mg total) by mouth daily. 01/05/13  Yes Jonelle Sidle, MD  irbesartan (AVAPRO) 75 MG tablet Take 75 mg by mouth at bedtime.   Yes Historical Provider, MD  oxyCODONE-acetaminophen (PERCOCET) 10-325 MG per tablet Take 1 tablet by mouth every 6 (six) hours as needed. For pain   Yes Historical Provider, MD  potassium chloride (K-DUR) 10 MEQ tablet Take 1 tablet (10 mEq total) by mouth daily. 05/28/12  Yes Kathlen Brunswick, MD  risperiDONE (RISPERDAL) 0.5 MG tablet Take 0.5 mg by mouth 2 (two) times daily. 02/05/12  Yes Christiane Ha, MD  simvastatin (ZOCOR) 20 MG tablet Take 20 mg by mouth at bedtime.  Yes Historical Provider, MD  Thiamine HCl (VITAMIN B-1) 250 MG tablet Take 250 mg by mouth daily.     Yes Historical Provider, MD   No Known Allergies  FAMILY HISTORY:  Family History  Problem Relation Age of Onset  . Coronary artery disease Father     Died age 95 with MI  . Diabetes type II Sister    SOCIAL HISTORY:  reports that he has been smoking Cigarettes.  He has a 60 pack-year smoking history. He has never used smokeless tobacco. He reports that he does not drink alcohol or use illicit drugs.  REVIEW OF SYSTEMS:  Cannot obtain due to confusion  SUBJECTIVE:   VITAL SIGNS: Temp:  [98.5 F (36.9 C)-99.3 F (37.4 C)] 98.8 F (37.1 C) (06/20 0300) Pulse Rate:  [57-88] 57 (06/20 0545) Resp:  [15-29] 23  (06/20 0545) BP: (64-132)/(34-78) 80/35 mmHg (06/20 0545) SpO2:  [66 %-100 %] 94 % (06/20 0545) HEMODYNAMICS:   VENTILATOR SETTINGS:   INTAKE / OUTPUT: Intake/Output     06/19 0701 - 06/20 0700   I.V. 1000   Blood 212.5   Total Intake 1212.5   Urine 300   Stool 1   Total Output 301   Net +911.5         PHYSICAL EXAMINATION:  Gen: chronically ill appearing, confused,  intermittently combative, pulling out IV's HEENT: NCAT, PERRL, EOMi, pale conjuctiva PULM: CTA B CV: RRR, no mgr, no JVD AB: BS+, soft, nontender, no hsm Ext: cool, trace edema, no clubbing, no cyanosis Derm: no rash or skin breakdown Neuro: somnolent, does not follow commands, moves all four ext   LABS:  Recent Labs Lab 01/22/13 2220 01/22/13 2356  HGB 7.5*  --   WBC 9.0  --   PLT 183  --   NA 140  --   K 4.1  --   CL 100  --   CO2 34*  --   GLUCOSE 124*  --   BUN 61*  --   CREATININE 1.20  --   CALCIUM 8.8  --   AST  --  14  ALT  --  11  ALKPHOS  --  40  BILITOT  --  0.1*  PROT  --  5.2*  ALBUMIN  --  2.9*  INR  --  1.02  PROBNP 741.6*  --    No results found for this basename: GLUCAP,  in the last 168 hours  6/20 CXR: clear lung fields, mediastinal lymphadenopathy, boot shaped heart 6/20 EKG : NSR, non specific st wave changes in precordial leads   ASSESSMENT / PLAN:  GASTROINTESTINAL A:  Acute GI bleed, presumed upper bleed given black tarry stools; actively bleeding with shock AM 6/20 P:   -2 U PRBC stat -PPI gtt -GI medicine to see/scope this AM -serial fluids   CARDIOVASCULAR A: Hemorrhagic shock CAD P:  -Phenylephrine as needed for MAP < 65 -continue aggressive IVF resuscitation, but watch fluid status -see GI -tele -hold atenolol  HEMATOLOGIC A:  GI Bleed as above P:  -serial H/H -given active bleed, transfuse 2 U PRBC now and use Hgb threshold < 8gm/dL  NEUROLOGIC A:  Acute encephalopathy> waxes and wanes with shock P:   -continue volume  resuscitation/BP support -hold sedating meds -frequent orientation  PULMONARY A: No acute issues P:   -O2 as needed  RENAL A:  No acute issues P:   -monitor UOP, BMET   INFECTIOUS A:  No acute issues P:  ENDOCRINE A:  No acute issues P:    Code status: Per family and patient, he is DNR and is very clear that he does not want CPR or mechanical ventilation for cardiac or respiratory failure.  His HCPOA believes that he would be OK with short term ventilation for the purpose of a procedure (endoscopy) if needed.  TODAY'S SUMMARY: 79 y/o male with mild dementia admitted with a likely upper GI bleed leading to hypotension and acute encephalopathy.  His BP is responding to IVF and blood product administration, but his condition is critical due to tenuous BP and active bleeding.  Plan continued resuscitation, PPI gtt and EGD.    I have personally obtained a history, examined the patient, evaluated laboratory and imaging results, formulated the assessment and plan and placed orders. CRITICAL CARE: The patient is critically ill with multiple organ systems failure and requires high complexity decision making for assessment and support, frequent evaluation and titration of therapies, application of advanced monitoring technologies and extensive interpretation of multiple databases. Critical Care Time devoted to patient care services described in this note is 90 minutes.   Fonnie Jarvis Pulmonary and Critical Care Medicine [facility] Pager: [phone]  01/23/2013, 6:07 AM

## 2013-01-23 NOTE — Clinical Documentation Improvement (Signed)
     Anemia Blood Loss Clarification  THIS DOCUMENT IS NOT A PERMANENT PART OF THE MEDICAL RECORD         01/23/13  Dear Dr. Craige Cotta,   In an effort to better capture your patient's severity of illness, reflect appropriate length of stay and utilization of resources, a review of the patient medical record has revealed the following indicators.    Based on your clinical judgment, please clarify and document in a progress note and/or discharge summary the clinical condition associated with the following supporting information: In the H&P from 01/23/13, it was documented "Acute GI bleed, presumed upper bleed given black tarry stools; actively bleeding with shock AM 6/20."  "Given active bleed, transfuse 2 U PRBC now and use Hgb threshold < 8gm/dL."  In responding to this query please exercise your independent judgment.  The fact that a query is asked, does not imply that any particular answer is desired or expected.  Possible Clinical Conditions?   Acute Blood Loss Anemia Other Condition________________ Cannot Clinically Determine   Risk Factors: (recent surgery, pre op anemia, EBL in OR): EGD done 01/23/13, found "moderately-sized Mallory-Weiss tear that was oozing blood.  There was approximately 200 cc of motor oil-appearing blood with dark and maroon-colored blood clots in the dependent portion of the stomach."   Diagnostics:  H&H on admit: 01/23/2013 7.5/23.9  H&H 02/07/2012: 14.2/41.3  H&H after transfusion:  8.8/25.6  Transfusion: Patient has received 3 units PRBC's as of now.    You may use possible, probable, or suspect with inpatient documentation. Possible, probable, suspected diagnoses MUST be documented at the time of discharge.  Reviewed:  no additional documentation provided  Thank You,  Darla Lesches, RN, BSN, CCRN Clinical Documentation Specialist Pager [phone]  Office [phone]  HIM department [facility]

## 2013-01-23 NOTE — Procedures (Signed)
Intubation Procedure Note MAHKAI FANGMAN [id number] 10/12/1932  Procedure: Intubation Indications: Airway protection and maintenance  Procedure Details Consent: Unable to obtain consent because of altered level of consciousness. Time Out: Verified patient identification, verified procedure, site/side was marked, verified correct patient position, special equipment/implants available, medications/allergies/relevent history reviewed, required imaging and test results available.  Performed  Maximum sterile technique was used including gloves and mask.  MAC and 3    Evaluation Hemodynamic Status: Persistent hypotension treated with pressors; O2 sats: stable throughout Patient's Current Condition: stable Complications: No apparent complications Patient did tolerate procedure well. Chest X-ray ordered to verify placement.  CXR: pending.   Breta Demedeiros S. 01/23/2013

## 2013-01-23 NOTE — Interval H&P Note (Signed)
History and Physical Interval Note:  01/23/2013 8:41 AM  [name]  has presented today for surgery, with the diagnosis of bleeder  The various methods of treatment have been discussed with the patient and family. After consideration of risks, benefits and other options for treatment, the patient has consented to  Procedure(s): ESOPHAGOGASTRODUODENOSCOPY (EGD) (N/A) as a surgical intervention .  The patient's history has been reviewed, patient examined, no change in status, stable for surgery.  I have reviewed the patient's chart and labs.  Questions were answered to the patient's satisfaction.     [name] C.

## 2013-01-23 NOTE — Progress Notes (Signed)
The Pt at this moment is to unstable to tx down stairs for Ct of the head.  Slow to respond he has been passing a large amt of dark bloody clots. Bp are soft 80/35 Md aware, Rapid response RN at the bedside. 1unit of blood complete. 500cc bolus given. Md has made family members aware of the pt's change in  Loc and bp. V/S 80/35, 59, 96,26. Order given to tx to ICU closer monitoring.

## 2013-01-23 NOTE — Progress Notes (Signed)
Pt alert x2 tx from ED with GI bleed. Report received from [facility]. Pt is actively bleeding, no family present at this time. The Pt does have periods of confusion. High fall risk . No c/o pain order for 1 unit of PRBCs. Will monitor.

## 2013-01-23 NOTE — Brief Op Note (Addendum)
Bleeding Mallory-Weiss Tear. Approx 200 cc of motor oil appearing blood noted in dependent portion of stomach. Maroon-colored and dark-colored blood clots in stomach. Hemostasis achieved with epi injection and bicap gold probe cautery. See OP note for details. Protonix drip. NPO.

## 2013-01-23 NOTE — Interval H&P Note (Signed)
History and Physical Interval Note:  01/23/2013 6:46 PM  [name]  has presented today for surgery, with the diagnosis of GI bleed  The various methods of treatment have been discussed with the patient and family. After consideration of risks, benefits and other options for treatment, the patient has consented to  Procedure(s): ESOPHAGOGASTRODUODENOSCOPY (EGD) (N/A) as a surgical intervention .  The patient's history has been reviewed, patient examined, no change in status, stable for surgery.  I have reviewed the patient's chart and labs.  Questions were answered to the patient's satisfaction.     Amanii Snethen C.

## 2013-01-23 NOTE — Significant Event (Signed)
Pt with Hb 6.8.  Will give 1 unit PRBC.  Coralyn Helling, MD 01/23/2013, 3:19 PM

## 2013-01-23 NOTE — Consult Note (Signed)
PULMONARY  / CRITICAL CARE MEDICINE  Name: [name] MRN: [id number] DOB: 1932-08-23    ADMISSION DATE:  01/22/2013 CONSULTATION DATE:  01/22/2013  REFERRING MD :  Adela Glimpse PRIMARY SERVICE: PCCM  CHIEF COMPLAINT:  GI bleed, confusion  BRIEF PATIENT DESCRIPTION: 79 y/o male with dementia who is DNR was admitted on 6/20 through the [facility] ED with a GI bleed and confusion.  He developed combativeness and worsening hypotension so PCCM was consulted.  SIGNIFICANT EVENTS: 6/20 Transfer to ICU  STUDIES:  6/20 EGD  LINES / TUBES: 6/20 R IJ Cordis >>   SUBJECTIVE:  Denies chest or abd pain.  VITAL SIGNS: Temp:  [98.1 F (36.7 C)-99.4 F (37.4 C)] 99.4 F (37.4 C) (06/20 1200) Pulse Rate:  [27-88] 69 (06/20 1200) Resp:  [15-36] 25 (06/20 1400) BP: (64-183)/(25-147) 124/56 mmHg (06/20 1400) SpO2:  [66 %-100 %] 100 % (06/20 1400) Weight:  [132 lb 4.4 oz (60 kg)] 132 lb 4.4 oz (60 kg) (06/20 0600) 2 liters [place]  INTAKE / OUTPUT: Intake/Output     06/19 0701 - 06/20 0700 06/20 0701 - 06/21 0700   I.V. (mL/kg) 1100 (18.3) 1033.5 (17.2)   Blood 402.5 700   IV Piggyback 2000    Total Intake(mL/kg) 3502.5 (58.4) 1733.5 (28.9)   Urine (mL/kg/hr) 300 420 (0.8)   Stool 1 5 (0)   Total Output 301 425   Net +3201.5 +1308.5        Urine Occurrence 200 x      PHYSICAL EXAMINATION:  Gen: no distress HEENT: Pale sclera PULM: no wheeze CV: regular AB: soft, non tender Ext: no edema Derm: no rash or skin breakdown Neuro: intermittently agitated, mildly confused, follows commands   LABS:  Recent Labs Lab 01/22/13 2220 01/22/13 2356 01/23/13 0630 01/23/13 0657 01/23/13 1057 01/23/13 1130 01/23/13 1400  HGB 7.5*  --   --  8.8* 8.6*  --  6.8*  WBC 9.0  --   --   --   --   --   --   PLT 183  --   --   --   --   --   --   NA 140  --  146*  --   --   --   --   K 4.1  --  4.0  --   --   --   --   CL 100  --  112  --   --   --   --   CO2 34*  --  26  --   --   --   --    GLUCOSE 124*  --  108*  --   --   --   --   BUN 61*  --  69*  --   --   --   --   CREATININE 1.20  --  1.05  --   --   --   --   CALCIUM 8.8  --  6.9*  --   --   --   --   MG  --   --  1.6  --   --   --   --   PHOS  --   --  3.3  --   --   --   --   AST  --  14 12  --   --   --   --   ALT  --  11 9  --   --   --   --  ALKPHOS  --  40 28*  --   --   --   --   BILITOT  --  0.1* 0.3  --   --   --   --   PROT  --  5.2* 3.7*  --   --   --   --   ALBUMIN  --  2.9* 2.1*  --   --   --   --   INR  --  1.02  --   --   --   --   --   TROPONINI  --   --   --   --   --  0.62*  --   PROBNP 741.6*  --   --   --   --   --   --     Recent Labs Lab 01/23/13 0623 01/23/13 0741 01/23/13 1128  GLUCAP 102* 114* 92    Imaging: Dg Chest 2 View  01/22/2013   *RADIOLOGY REPORT*  Clinical Data: Loss of consciousness.  CHEST - 2 VIEW  Comparison: Chest x-ray 02/03/2012.  Findings: Lung volumes are normal.  No consolidative airspace disease.  No pleural effusions.  No pneumothorax.  No pulmonary nodule or mass noted.  Pulmonary vasculature and the cardiomediastinal silhouette are within normal limits. Atherosclerosis of the thoracic aorta.  Status post median sternotomy.  IMPRESSION: 1. No radiographic evidence of acute cardiopulmonary disease. 2.  Atherosclerosis.   Original Report Authenticated By: Trudie Reed, M.D.   Dg Chest Port 1 View  01/23/2013   *RADIOLOGY REPORT*  Clinical Data: Status post central line placement.  PORTABLE CHEST - 1 VIEW  Comparison: Chest x-ray 01/22/2013.  Findings: There is a right-sided internal jugular central venous catheter with tip terminating in the distal right internal jugular vein.  No pneumothorax.  No acute consolidative airspace disease. No pleural effusions.  Pulmonary vasculature is within normal limits.  Heart size is normal.  Mediastinal contours are unremarkable.  Atherosclerosis in the thoracic aorta.  Status post median sternotomy.  IMPRESSION: 1.  Tip of  right internal jugular central venous catheter appears to be in the distal right internal jugular vein.  No pneumothorax. 2.  No radiographic evidence of acute cardiopulmonary disease. 3.  Atherosclerosis.   Original Report Authenticated By: Trudie Reed, M.D.      ASSESSMENT / PLAN:  GASTROINTESTINAL A: Upper GI bleed 2nd to mallory weiss tear. P:   -protonix per GI -advanced diet when okay with GI  CARDIOVASCULAR A: Hemorrhagic shock. Hx of CAD,HTN, hyperlipidemia. P:  -wean off pressors to keep MAP > 65 -volume resuscitate with crystalloid/blood -hold atenolol, zetia, lasix, avapro, zocor, plendil -no ASA due to bleeding  HEMATOLOGIC A: Acute blood loss anemia. P:  -serial H/H -transfuse for bleeding or Hb < 7 -SCD for DVT prevention  NEUROLOGIC A:  Acute encephalopathy 2nd to hypotension. Hx of mild dementia. P:   -monitor clinically -hold celexa, risperdal  -prn haldol IV  RHEUMATOLOGY A: Hx of rheumatoid arthritis. P: -prn fentanyl for pain  PULMONARY A: No acute issues P:   -O2 as needed  RENAL A:  No acute issues P:   -monitor UOP, BMET  INFECTIOUS A:  No acute issues P:   -monitor clinically  ENDOCRINE A:  No acute issues P:   -monitor blood sugar on BMET   Code status: Okay for short term intubation if needed.  No CPR, no defibrillation.  D/w Dr. Bosie Clos, and updated family at bedside.  CC time 40 minutes.  Coralyn Helling, MD [facility] Pulmonary/Critical Care 01/23/2013, 3:39 PM Pager:  [phone] After 3pm call: [phone]

## 2013-01-23 NOTE — H&P (Addendum)
PCP:  Samuel Jester, DO  Cardiology: [facility]  Chief Complaint:  confusion  HPI: [name] is a 79 y.o. male   has a past medical history of Coronary atherosclerosis of native coronary artery; Essential hypertension, benign; Mixed hyperlipidemia; Erectile dysfunction; Osteoarthritis; Myocardial infarction; and Rheumatoid arthritis, adult.   Presented with  Confusion since AM he does have some baseline dementia but at baseline very active and close to normal cognition.  He had been lethargic as well. He had an episosede where his responsiveness declined patient have been very lethargic since arrival to ED. Low blood pressures were noted in ED with systolics in 90's.  In ED he was noted to have anemia with Hg down to 7.5. He was hem positive from below with black stools. Gi consult aware and will see in AM. Hospitalist to admit.   During my evaluation patient was noted to be become hypotensive transiently with SBP down to 60's during this episode he became severely lethargic. After getting IVF bollus his blood pressure and mental status responded rapidly and current SBP in 120'  Review of Systems:     Pertinent positives include: fatigue, confusion  Constitutional:  No weight loss, night sweats, Fevers, chills,weight loss  HEENT:  No headaches, Difficulty swallowing,Tooth/dental problems,Sore throat,  No sneezing, itching, ear ache, nasal congestion, post nasal drip,  Cardio-vascular:  No chest pain, Orthopnea, PND, anasarca, dizziness, palpitations.no Bilateral lower extremity swelling  GI:  No heartburn, indigestion, abdominal pain, nausea, vomiting, diarrhea, change in bowel habits, loss of appetite, melena, blood in stool, hematemesis Resp:  no shortness of breath at rest. No dyspnea on exertion, No excess mucus, no productive cough, No non-productive cough, No coughing up of blood.No change in color of mucus.No wheezing. Skin:  no rash or lesions. No jaundice GU:  no  dysuria, change in color of urine, no urgency or frequency. No straining to urinate.  No flank pain.  Musculoskeletal:  No joint pain or no joint swelling. No decreased range of motion. No back pain.  Psych:  No change in mood or affect. No depression or anxiety. No memory loss.  Neuro: no localizing neurological complaints, no tingling, no weakness, no double vision, no gait abnormality, no slurred speech, no   Otherwise ROS are negative except for above, 10 systems were reviewed  Past Medical History: Past Medical History  Diagnosis Date  . Coronary atherosclerosis of native coronary artery     Multivessel  . Essential hypertension, benign   . Mixed hyperlipidemia   . Erectile dysfunction   . Osteoarthritis   . Myocardial infarction     IMI 1987 and PLMI 1997  . Rheumatoid arthritis, adult    Past Surgical History  Procedure Laterality Date  . Coronary artery bypass graft  1987  . Carpal tunnel release    . Rotator cuff repair    . Neck surgery       Medications: Prior to Admission medications   Medication Sig Start Date End Date Taking? Authorizing Provider  aspirin 325 MG tablet Take 325 mg by mouth daily.   Yes Historical Provider, MD  atenolol (TENORMIN) 25 MG tablet Take 25 mg by mouth daily.    Yes Historical Provider, MD  citalopram (CELEXA) 10 MG tablet Take 20 mg by mouth at bedtime.  02/05/12 02/04/13 Yes Christiane Ha, MD  ezetimibe (ZETIA) 10 MG tablet Take 10 mg by mouth daily.     Yes Historical Provider, MD  felodipine (PLENDIL) 5 MG 24 hr  tablet Take 5 mg by mouth daily.    Yes Historical Provider, MD  furosemide (LASIX) 20 MG tablet Take 1 tablet (20 mg total) by mouth daily. 01/05/13  Yes Jonelle Sidle, MD  irbesartan (AVAPRO) 75 MG tablet Take 75 mg by mouth at bedtime.   Yes Historical Provider, MD  oxyCODONE-acetaminophen (PERCOCET) 10-325 MG per tablet Take 1 tablet by mouth every 6 (six) hours as needed. For pain   Yes Historical Provider, MD   potassium chloride (K-DUR) 10 MEQ tablet Take 1 tablet (10 mEq total) by mouth daily. 05/28/12  Yes Kathlen Brunswick, MD  risperiDONE (RISPERDAL) 0.5 MG tablet Take 0.5 mg by mouth 2 (two) times daily. 02/05/12  Yes Christiane Ha, MD  simvastatin (ZOCOR) 20 MG tablet Take 20 mg by mouth at bedtime.    Yes Historical Provider, MD  Thiamine HCl (VITAMIN B-1) 250 MG tablet Take 250 mg by mouth daily.     Yes Historical Provider, MD    Allergies:  No Known Allergies  Social History:  Ambulatory   cane Lives at   home   reports that he has been smoking Cigarettes.  He has a 60 pack-year smoking history. He has never used smokeless tobacco. He reports that he does not drink alcohol or use illicit drugs.   Family History: family history includes Coronary artery disease in his father and Diabetes type II in his sister.    Physical Exam: Patient Vitals for the past 24 hrs:  BP Temp Temp src Pulse Resp SpO2  01/23/13 0007 108/43 mmHg 99.3 F (37.4 C) Oral - 17 96 %  01/22/13 2230 113/44 mmHg - - 67 23 96 %  01/22/13 2222 112/46 mmHg - - - 16 95 %  01/22/13 2200 86/46 mmHg - - 62 25 92 %  01/22/13 2130 107/47 mmHg - - 61 22 97 %  01/22/13 2128 101/45 mmHg 98.7 F (37.1 C) Oral 70 16 97 %    1. General:  in No Acute distress 2. Psychological: lethargic and confused at first coming back to base line after improved BP 3. Head/ENT:     Dry Mucous Membranes                          Head Non traumatic, neck supple                          Normal   Dentition 4. SKIN: decreased Skin turgor,  Skin clean Dry and intact no rash 5. Heart: Regular rate and rhythm no Murmur, Rub or gallop 6. Lungs: Clear to auscultation bilaterally, no wheezes or crackles   7. Abdomen: Soft, non-tender, Non distended 8. Lower extremities: no clubbing, cyanosis, or edema 9. Neurologically strength 5/5 in all 4 ext CN 2-12 intact 10. MSK: Normal range of motion  Hemoccult positive  body mass index is  unknown because there is no weight on file.   Labs on Admission:   Recent Labs  01/22/13 2220  NA 140  K 4.1  CL 100  CO2 34*  GLUCOSE 124*  BUN 61*  CREATININE 1.20  CALCIUM 8.8   No results found for this basename: AST, ALT, ALKPHOS, BILITOT, PROT, ALBUMIN,  in the last 72 hours No results found for this basename: LIPASE, AMYLASE,  in the last 72 hours  Recent Labs  01/22/13 2220  WBC 9.0  HGB 7.5*  HCT 23.9*  MCV 79.7  PLT 183   No results found for this basename: CKTOTAL, CKMB, CKMBINDEX, TROPONINI,  in the last 72 hours No results found for this basename: TSH, T4TOTAL, FREET3, T3FREE, THYROIDAB,  in the last 72 hours No results found for this basename: VITAMINB12, FOLATE, FERRITIN, TIBC, IRON, RETICCTPCT,  in the last 72 hours No results found for this basename: HGBA1C    The CrCl is unknown because both a height and weight (above a minimum accepted value) are required for this calculation. ABG No results found for this basename: phart, pco2, po2, hco3, tco2, acidbasedef, o2sat     No results found for this basename: DDIMER     Other results:  I have pearsonaly reviewed this: ECG REPORT  Rate:72   Rhythm: NSR  ST&T Change: q waves II, III, v5,v2  UA mild UTI  Cultures:    Component Value Date/Time   SDES URINE, CLEAN CATCH 02/07/2012 1454   SPECREQUEST NONE 02/07/2012 1454   CULT NO GROWTH 02/07/2012 1454   REPTSTATUS 02/08/2012 FINAL 02/07/2012 1454       Radiological Exams on Admission: Dg Chest 2 View  01/22/2013   *RADIOLOGY REPORT*  Clinical Data: Loss of consciousness.  CHEST - 2 VIEW  Comparison: Chest x-ray 02/03/2012.  Findings: Lung volumes are normal.  No consolidative airspace disease.  No pleural effusions.  No pneumothorax.  No pulmonary nodule or mass noted.  Pulmonary vasculature and the cardiomediastinal silhouette are within normal limits. Atherosclerosis of the thoracic aorta.  Status post median sternotomy.  IMPRESSION: 1. No  radiographic evidence of acute cardiopulmonary disease. 2.  Atherosclerosis.   Original Report Authenticated By: Trudie Reed, M.D.    Chart has been reviewed  Assessment/Plan  79 yo M here with symptomatic anemia and upper GI bleed  Present on Admission:  . Upper GI bleed - admit to step down, IVF,  Transfuse 1 unit of pRBC, GI consult and EGD in AM, protonix 80 iv and then 8mg /h, 2 PIV's, given episode of hypotension would have CCM monitor with e-link . Anemia - likely due to upper gi bleed, transfuse and follow CBC closely . Coronary atherosclerosis of native coronary artery - a symptomatic despite anemia but will cycle cardiac enzymes given confusion  . Essential hypertension, benign - old home medications given episode of hypotension  . Confusion - most likely secondary anemia/hypotension we'll nonetheless check CT scan of the head. Neurologically currently back to baseline and intact  . Hypotension - transient corrected with IV fluids likely secondary to fluid depletion secondary to GI bleed UTI - mild will cover with rocephin and await result of urine culture Prophylaxis: SCD Protonix  CODE STATUS: Patient wishes to BE DO NOT RESUSCITATE DO NOT INTUBATE  Other plan as per orders.  I have spent a total of  65 min on this admission time taken to discuss care with CCM  Angelica Frandsen 01/23/2013, 12:55 AM

## 2013-01-23 NOTE — Progress Notes (Signed)
01/23/13 Pt had five to six large bloody stools between 1600-1700 this afternoon. Stools went from dark red to bright red. CCM MD Sood and Dr Bosie Clos both kept updated during these occurences. Hem 6.8 at 1500, After transfusing 1 unit of blood hem 6.3. Both MDs notified again. 2 more units of blood ordered. Pt to be intubated for another scope. Will continue to monitor.

## 2013-01-23 NOTE — Consult Note (Signed)
PULMONARY  / CRITICAL CARE MEDICINE  Name: [name] MRN: [id number] DOB: 09/06/1932    ADMISSION DATE:  01/22/2013 CONSULTATION DATE:  01/22/2013  REFERRING MD :  D[place]ova PRIMARY SERVICE: PCCM  CHIEF COMPLAINT:  GI bleed, confusion  BRIEF PATIENT DESCRIPTION: 80 y/o male with dementia who is DNR was admitted on 6/20 through the [facility] ED with a GI bleed and confusion.  He developed combativeness and worsening hypotension so PCCM was consulted.  SIGNIFICANT EVENTS / STUDIES:    LINES / TUBES: 6/20 R IJ Cordis >>  CULTURES:   ANTIBIOTICS:   HISTORY OF PRESENT ILLNESS: 80 y/o male with dementia who is DNR was admitted on 6/20 through the [facility] ED with a GI bleed and confusion.  He developed combativeness and worsening hypotension so PCCM was consulted.   History is obtained by chart review as he is confused on my assessment.  He has had confusion, lethargy requiring his family to call EMS to bring him to the ED.  There he was found to be hypotensive and anemic with melanotic stools.  His mental status improved with resuscitation and he was admitted to SDU. Unfortunately he became more hypotensive and combative so PCCM was consulted.    I have spoken with his daughter and sister-in-law (HCPOA) on the phone and they request that we do what ever we can to help him short of CPR and mechanical ventilation.  They understand that he is critically ill and may not survive.  PAST MEDICAL HISTORY :  Past Medical History  Diagnosis Date  . Coronary atherosclerosis of native coronary artery     Multivessel  . Essential hypertension, benign   . Mixed hyperlipidemia   . Erectile dysfunction   . Osteoarthritis   . Myocardial infarction     IMI 1987 and PLMI 1997  . Rheumatoid arthritis, adult    Past Surgical History  Procedure Laterality Date  . Coronary artery bypass graft  1987  . Carpal tunnel release    . Rotator cuff repair    . Neck surgery     Prior to Admission medications    Medication Sig Start Date End Date Taking? Authorizing Provider  aspirin 325 MG tablet Take 325 mg by mouth daily.   Yes Historical Provider, MD  atenolol (TENORMIN) 25 MG tablet Take 25 mg by mouth daily.    Yes Historical Provider, MD  citalopram (CELEXA) 10 MG tablet Take 20 mg by mouth at bedtime.  02/05/12 02/04/13 Yes Corinna L Sullivan, MD  ezetimibe (ZETIA) 10 MG tablet Take 10 mg by mouth daily.     Yes Historical Provider, MD  felodipine (PLENDIL) 5 MG 24 hr tablet Take 5 mg by mouth daily.    Yes Historical Provider, MD  furosemide (LASIX) 20 MG tablet Take 1 tablet (20 mg total) by mouth daily. 01/05/13  Yes Samuel G McDowell, MD  irbesartan (AVAPRO) 75 MG tablet Take 75 mg by mouth at bedtime.   Yes Historical Provider, MD  oxyCODONE-acetaminophen (PERCOCET) 10-325 MG per tablet Take 1 tablet by mouth every 6 (six) hours as needed. For pain   Yes Historical Provider, MD  potassium chloride (K-DUR) 10 MEQ tablet Take 1 tablet (10 mEq total) by mouth daily. 05/28/12  Yes Robert M Rothbart, MD  risperiDONE (RISPERDAL) 0.5 MG tablet Take 0.5 mg by mouth 2 (two) times daily. 02/05/12  Yes Corinna L Sullivan, MD  simvastatin (ZOCOR) 20 MG tablet Take 20 mg by mouth at bedtime.      Yes Historical Provider, MD  Thiamine HCl (VITAMIN B-1) 250 MG tablet Take 250 mg by mouth daily.     Yes Historical Provider, MD   No Known Allergies  FAMILY HISTORY:  Family History  Problem Relation Age of Onset  . Coronary artery disease Father     Died age 70 with MI  . Diabetes type II Sister    SOCIAL HISTORY:  reports that he has been smoking Cigarettes.  He has a 60 pack-year smoking history. He has never used smokeless tobacco. He reports that he does not drink alcohol or use illicit drugs.  REVIEW OF SYSTEMS:  Cannot obtain due to confusion  SUBJECTIVE:   VITAL SIGNS: Temp:  [98.5 F (36.9 C)-99.3 F (37.4 C)] 98.8 F (37.1 C) (06/20 0300) Pulse Rate:  [57-88] 57 (06/20 0545) Resp:  [15-29] 23  (06/20 0545) BP: (64-132)/(34-78) 80/35 mmHg (06/20 0545) SpO2:  [66 %-100 %] 94 % (06/20 0545) HEMODYNAMICS:   VENTILATOR SETTINGS:   INTAKE / OUTPUT: Intake/Output     06/19 0701 - 06/20 0700   I.V. 1000   Blood 212.5   Total Intake 1212.5   Urine 300   Stool 1   Total Output 301   Net +911.5         PHYSICAL EXAMINATION:  Gen: chronically ill appearing, confused,  intermittently combative, pulling out IV's HEENT: NCAT, PERRL, EOMi, pale conjuctiva PULM: CTA B CV: RRR, no mgr, no JVD AB: BS+, soft, nontender, no hsm Ext: cool, trace edema, no clubbing, no cyanosis Derm: no rash or skin breakdown Neuro: somnolent, does not follow commands, moves all four ext   LABS:  Recent Labs Lab 01/22/13 2220 01/22/13 2356  HGB 7.5*  --   WBC 9.0  --   PLT 183  --   NA 140  --   K 4.1  --   CL 100  --   CO2 34*  --   GLUCOSE 124*  --   BUN 61*  --   CREATININE 1.20  --   CALCIUM 8.8  --   AST  --  14  ALT  --  11  ALKPHOS  --  40  BILITOT  --  0.1*  PROT  --  5.2*  ALBUMIN  --  2.9*  INR  --  1.02  PROBNP 741.6*  --    No results found for this basename: GLUCAP,  in the last 168 hours  6/20 CXR: clear lung fields, mediastinal lymphadenopathy, boot shaped heart 6/20 EKG : NSR, non specific st wave changes in precordial leads   ASSESSMENT / PLAN:  GASTROINTESTINAL A:  Acute GI bleed, presumed upper bleed given black tarry stools; actively bleeding with shock AM 6/20 P:   -2 U PRBC stat -PPI gtt -GI medicine to see/scope this AM -serial fluids   CARDIOVASCULAR A: Hemorrhagic shock CAD P:  -Phenylephrine as needed for MAP < 65 -continue aggressive IVF resuscitation, but watch fluid status -see GI -tele -hold atenolol  HEMATOLOGIC A:  GI Bleed as above P:  -serial H/H -given active bleed, transfuse 2 U PRBC now and use Hgb threshold < 8gm/dL  NEUROLOGIC A:  Acute encephalopathy> waxes and wanes with shock P:   -continue volume  resuscitation/BP support -hold sedating meds -frequent orientation  PULMONARY A: No acute issues P:   -O2 as needed  RENAL A:  No acute issues P:   -monitor UOP, BMET   INFECTIOUS A:  No acute issues P:       ENDOCRINE A:  No acute issues P:    Code status: Per family and patient, he is DNR and is very clear that he does not want CPR or mechanical ventilation for cardiac or respiratory failure.  His HCPOA believes that he would be OK with short term ventilation for the purpose of a procedure (endoscopy) if needed.  TODAY'S SUMMARY: 80 y/o male with mild dementia admitted with a likely upper GI bleed leading to hypotension and acute encephalopathy.  His BP is responding to IVF and blood product administration, but his condition is critical due to tenuous BP and active bleeding.  Plan continued resuscitation, PPI gtt and EGD.    I have personally obtained a history, examined the patient, evaluated laboratory and imaging results, formulated the assessment and plan and placed orders. CRITICAL CARE: The patient is critically ill with multiple organ systems failure and requires high complexity decision making for assessment and support, frequent evaluation and titration of therapies, application of advanced monitoring technologies and extensive interpretation of multiple databases. Critical Care Time devoted to patient care services described in this note is 90 minutes.   MCQUAID, DOUGLAS,MD Pulmonary and Critical Care Medicine  HealthCare Pager: [phone]  01/23/2013, 6:07 AM    

## 2013-01-24 DIAGNOSIS — G9340 Encephalopathy, unspecified: Secondary | ICD-10-CM

## 2013-01-24 DIAGNOSIS — R578 Other shock: Secondary | ICD-10-CM

## 2013-01-24 DIAGNOSIS — J9600 Acute respiratory failure, unspecified whether with hypoxia or hypercapnia: Secondary | ICD-10-CM | POA: Diagnosis not present

## 2013-01-24 DIAGNOSIS — D62 Acute posthemorrhagic anemia: Secondary | ICD-10-CM

## 2013-01-24 DIAGNOSIS — K922 Gastrointestinal hemorrhage, unspecified: Secondary | ICD-10-CM

## 2013-01-24 LAB — BASIC METABOLIC PANEL
BUN: 59 mg/dL — ABNORMAL HIGH (ref 6–23)
CO2: 25 mEq/L (ref 19–32)
Calcium: 6.8 mg/dL — ABNORMAL LOW (ref 8.4–10.5)
Creatinine, Ser: 0.98 mg/dL (ref 0.50–1.35)
GFR calc Af Amer: 89 mL/min — ABNORMAL LOW (ref >90)

## 2013-01-24 LAB — GLUCOSE, CAPILLARY
Glucose-Capillary: 106 mg/dL — ABNORMAL HIGH (ref 70–99)
Glucose-Capillary: 112 mg/dL — ABNORMAL HIGH (ref 70–99)
Glucose-Capillary: 122 mg/dL — ABNORMAL HIGH (ref 70–99)
Glucose-Capillary: 129 mg/dL — ABNORMAL HIGH (ref 70–99)
Glucose-Capillary: 148 mg/dL — ABNORMAL HIGH (ref 70–99)

## 2013-01-24 LAB — CBC
HCT: 25.4 % — ABNORMAL LOW (ref 39.0–52.0)
MCH: 30.1 pg (ref 26.0–34.0)
MCV: 84.1 fL (ref 78.0–100.0)
Platelets: 73 10*3/uL — ABNORMAL LOW (ref 150–400)
RDW: 15.3 % (ref 11.5–15.5)

## 2013-01-24 LAB — HEMOGLOBIN AND HEMATOCRIT, BLOOD
HCT: 21.3 % — ABNORMAL LOW (ref 39.0–52.0)
Hemoglobin: 7.5 g/dL — ABNORMAL LOW (ref 13.0–17.0)

## 2013-01-24 MED ORDER — DEXMEDETOMIDINE HCL IN NACL 200 MCG/50ML IV SOLN
0.2000 ug/kg/h | INTRAVENOUS | Status: DC
  Administered 2013-01-24 (×2): 1 ug/kg/h via INTRAVENOUS
  Administered 2013-01-24: 0.5 ug/kg/h via INTRAVENOUS
  Administered 2013-01-24: 0.567 ug/kg/h via INTRAVENOUS
  Administered 2013-01-25: 0.9 ug/kg/h via INTRAVENOUS
  Administered 2013-01-25: 0.7 ug/kg/h via INTRAVENOUS
  Administered 2013-01-25: 0.8 ug/kg/h via INTRAVENOUS
  Filled 2013-01-24 (×7): qty 50

## 2013-01-24 MED ORDER — KCL IN DEXTROSE-NACL 20-5-0.45 MEQ/L-%-% IV SOLN
INTRAVENOUS | Status: DC
  Administered 2013-01-24 – 2013-01-26 (×4): via INTRAVENOUS
  Filled 2013-01-24 (×5): qty 1000

## 2013-01-24 NOTE — Progress Notes (Signed)
eLink Physician-Brief Progress Note Patient Name: [name] DOB: Jun 05, 1933 MRN: [id number]  Date of Service  01/24/2013   HPI/Events of Note  HR 52 on percedex. bP 110 on neo but titrating dose down No active bleeding   Recent Labs Lab 01/23/13 1400 01/23/13 1716 01/23/13 2120 01/24/13 0440 01/24/13 1500  HGB 6.8* 6.3* 10.4* 9.1* 7.5*      eICU Interventions  PRGBC for hgb < 7 IR if actively bleeding      Ara Grandmaison 01/24/2013, 7:14 PM

## 2013-01-24 NOTE — Progress Notes (Signed)
Patient ID: [name], male   DOB: 1932/09/04, 79 y.o.   MRN: [id number] [facility] Gastroenterology Progress Note  [name] 79 y.o. 02/26/1933   Subjective: Intubated; Sedated; Large amount of dark red blood overnight; No vomiting. BP 64/35 off of pressors that were turned off this AM but improving with restarting of pressors.  Objective: Vital signs in last 24 hours: Filed Vitals:   01/24/13 0600  BP: 112/62  Pulse: 68  Temp: 99.2 F (37.3 C)  Resp: 19    Physical Exam: Gen: intubated; sedated Chest: coarse breath sounds CV: RRR Abd: soft, nondistended, nontender(no grimace), +BS  Lab Results:  Recent Labs  01/22/13 2220 01/23/13 0630 01/24/13 0440  NA 140 146* 145  K 4.1 4.0 3.7  CL 100 112 116*  CO2 34* 26 25  GLUCOSE 124* 108* 107*  BUN 61* 69* 59*  CREATININE 1.20 1.05 0.98  CALCIUM 8.8 6.9* 6.8*  MG  --  1.6  --   PHOS  --  3.3  --     Recent Labs  01/22/13 2356 01/23/13 0630  AST 14 12  ALT 11 9  ALKPHOS 40 28*  BILITOT 0.1* 0.3  PROT 5.2* 3.7*  ALBUMIN 2.9* 2.1*    Recent Labs  01/22/13 2220  01/23/13 2120 01/24/13 0440  WBC 9.0  --   --  13.7*  HGB 7.5*  < > 10.4* 9.1*  HCT 23.9*  < > 28.9* 25.4*  MCV 79.7  --   --  84.1  PLT 183  --   --  73*  < > = values in this interval not displayed.  Recent Labs  01/22/13 2356  LABPROT 13.3  INR 1.02      Assessment/Plan: 80yo s/p upper GI bleed due to Mallory-Weiss tear and s/p EGD X 2 with cautery and epinephrine injection yesterday due to recurrent bleeding. Unable to clear fundus due to blood clots to look for other sources of bleeding. If rebleeds needs angiogram as discussed yesterday with Dr. Craige Cotta. Continue Protonix drip. Hgb 9.1. Will follow.   [name] 01/24/2013, 7:11 AM

## 2013-01-24 NOTE — Progress Notes (Addendum)
INITIAL NUTRITION ASSESSMENT  DOCUMENTATION CODES Per approved criteria  -Not Applicable   INTERVENTION:  If EN started, recommend initiation of Osmolite 1.2 formula at 20 ml/hr and advance by 10 ml every 4 hours to goal rate of 60 ml/hr with Prostat liquid protein 30 ml daily to provide 1828 kcals, 95 gm protein, 1181 ml of free water  Recommend liquid MVI daily via tube RD to follow for nutrition care plan  NUTRITION DIAGNOSIS: Inadequate oral intake related to inability to eat as evidenced by NPO status  Goal: Initiate nutrition support within next 24-48 hours if prolonged intubation expected  Monitor:  Nutrition support initiation, respiratory status, weight, labs, I/O's  Reason for Assessment: VDRF  79 y.o. male  Admitting Dx: melena  ASSESSMENT: Patient with hx of HTN, osteoarthritis and MI admitted on 6/20 through [facility] ED with GI bleed and confusion; developed combativeness and worsening hypotension; s/p EGD ---> impression is upper GI bleed due to Mallory-Weiss tear.   Patient is currently intubated on ventilator support MV: 9.2 Temp: 37.9  Height: Ht Readings from Last 1 Encounters:  01/23/13 5\' 3"  (1.6 m)    Weight: Wt Readings from Last 1 Encounters:  01/23/13 132 lb 4.4 oz (60 kg)    Ideal Body Weight: 124 lb  % Ideal Body Weight: 106%  Wt Readings from Last 10 Encounters:  01/23/13 132 lb 4.4 oz (60 kg)  01/23/13 132 lb 4.4 oz (60 kg)  01/23/13 132 lb 4.4 oz (60 kg)  04/17/12 128 lb (58.06 kg)  02/07/12 120 lb (54.432 kg)  02/05/12 122 lb 3.2 oz (55.43 kg)  10/29/11 121 lb (54.885 kg)  07/14/11 120 lb (54.432 kg)  04/26/11 118 lb (53.524 kg)    Usual Body Weight: 128 lb  % Usual Body Weight: 103%  BMI:  Body mass index is 23.44 kg/(m^2).  Estimated Nutritional Needs: Kcal: 1600-1750 Protein: 90-100 gm Fluid: per MD  Skin: Intact  Diet Order: NPO  EDUCATION NEEDS: -No education needs identified at this time   Intake/Output  Summary (Last 24 hours) at 01/24/13 0917 Last data filed at 01/24/13 0600  Gross per 24 hour  Intake 3892.2 ml  Output   1225 ml  Net 2667.2 ml    Labs:   Recent Labs Lab 01/22/13 2220 01/23/13 0630 01/24/13 0440  NA 140 146* 145  K 4.1 4.0 3.7  CL 100 112 116*  CO2 34* 26 25  BUN 61* 69* 59*  CREATININE 1.20 1.05 0.98  CALCIUM 8.8 6.9* 6.8*  MG  --  1.6  --   PHOS  --  3.3  --   GLUCOSE 124* 108* 107*    CBG (last 3)   Recent Labs  01/23/13 2337 01/24/13 0357 01/24/13 0803  GLUCAP 148* 112* 106*    Scheduled Meds: . antiseptic oral rinse  15 mL Mouth Rinse q12n4p  . cefTRIAXone (ROCEPHIN) IVPB 1 gram/50 mL D5W  1 g Intravenous Q24H  . chlorhexidine  15 mL Mouth Rinse BID  . insulin aspart  0-15 Units Subcutaneous Q4H  . pantoprazole (PROTONIX) IV  40 mg Intravenous Once  . sodium chloride  3 mL Intravenous Q12H    Continuous Infusions: . sodium chloride 75 mL/hr at 01/23/13 2031  . sodium chloride    . pantoprozole (PROTONIX) infusion 8 mg/hr (01/24/13 0429)  . phenylephrine (NEO-SYNEPHRINE) Adult infusion 100 mcg/min (01/24/13 0700)    Past Medical History  Diagnosis Date  . Coronary atherosclerosis of native coronary artery  Multivessel  . Essential hypertension, benign   . Mixed hyperlipidemia   . Erectile dysfunction   . Osteoarthritis   . Myocardial infarction     IMI 1987 and PLMI 1997  . Rheumatoid arthritis, adult     Past Surgical History  Procedure Laterality Date  . Coronary artery bypass graft  1987  . Carpal tunnel release    . Rotator cuff repair    . Neck surgery      Maureen Chatters, RD, LDN Pager #: [phone] After-Hours Pager #: [phone]

## 2013-01-24 NOTE — Progress Notes (Signed)
PULMONARY  / CRITICAL CARE MEDICINE  Name: [name] MRN: [id number] DOB: [birth year]    ADMISSION DATE:  01/22/2013 CONSULTATION DATE:  01/22/2013  REFERRING MD :  Adela Glimpse PRIMARY SERVICE: PCCM  CHIEF COMPLAINT:  GI bleed, confusion  BRIEF PATIENT DESCRIPTION: 79 y/o male with dementia who is DNR was admitted on 6/20 through the [facility] ED with a GI bleed and confusion.  He developed combativeness and worsening hypotension so PCCM was consulted.  SIGNIFICANT EVENTS: 6/20 Transfer to ICU, EGD >> rebleed in PM >> VDRF, repeat EGD  STUDIES:  6/20 EGD >> mallory weiss   LINES / TUBES: 6/20 R IJ Cordis >>  SUBJECTIVE:  Remains on pressors.  Still having melanotic stools.  VITAL SIGNS: Temp:  [98.2 F (36.8 C)-100.8 F (38.2 C)] 100.3 F (37.9 C) (06/21 0800) Pulse Rate:  [51-121] 70 (06/21 0915) Resp:  [12-33] 23 (06/21 0915) BP: (59-146)/(35-100) 116/50 mmHg (06/21 0915) SpO2:  [79 %-100 %] 100 % (06/21 0915) FiO2 (%):  [30 %-50 %] 30 % (06/21 0800) VENT SETTINGS: Vent Mode:  [-] PRVC FiO2 (%):  [30 %-50 %] 30 % Set Rate:  [14 bmp] 14 bmp Vt Set:  [500 mL] 500 mL PEEP:  [5 cmH20] 5 cmH20 Plateau Pressure:  [10 cmH20-14 cmH20] 14 cmH20 INTAKE / OUTPUT: Intake/Output     06/20 0701 - 06/21 0700 06/21 0701 - 06/22 0700   I.V. (mL/kg) 2948.1 (49.1) 200 (3.3)   Blood 1750    IV Piggyback 550    Total Intake(mL/kg) 5248.1 (87.5) 200 (3.3)   Urine (mL/kg/hr) 1220 (0.8) 75 (0.5)   Stool 7 (0)    Total Output 1227 75   Net +4021.1 +125        Stool Occurrence 8 x      PHYSICAL EXAMINATION:  Gen: no distress HEENT: ETT in place PULM: no wheeze CV: regular AB: soft, non tender Ext: no edema Derm: no rash or skin breakdown Neuro: intermittently agitated, mildly confused   LABS:  Recent Labs Lab 01/22/13 2220 01/22/13 2356 01/23/13 0630  01/23/13 1130 01/23/13 1214  01/23/13 1716 01/23/13 1814 01/23/13 2120 01/23/13 2212 01/24/13 0440  HGB 7.5*  --   --    < >  --   --   < > 6.3*  --  10.4*  --  9.1*  WBC 9.0  --   --   --   --   --   --   --   --   --   --  13.7*  PLT 183  --   --   --   --   --   --   --   --   --   --  73*  NA 140  --  146*  --   --   --   --   --   --   --   --  145  K 4.1  --  4.0  --   --   --   --   --   --   --   --  3.7  CL 100  --  112  --   --   --   --   --   --   --   --  116*  CO2 34*  --  26  --   --   --   --   --   --   --   --  25  GLUCOSE  124*  --  108*  --   --   --   --   --   --   --   --  107*  BUN 61*  --  69*  --   --   --   --   --   --   --   --  59*  CREATININE 1.20  --  1.05  --   --   --   --   --   --   --   --  0.98  CALCIUM 8.8  --  6.9*  --   --   --   --   --   --   --   --  6.8*  MG  --   --  1.6  --   --   --   --   --   --   --   --   --   PHOS  --   --  3.3  --   --   --   --   --   --   --   --   --   AST  --  14 12  --   --   --   --   --   --   --   --   --   ALT  --  11 9  --   --   --   --   --   --   --   --   --   ALKPHOS  --  40 28*  --   --   --   --   --   --   --   --   --   BILITOT  --  0.1* 0.3  --   --   --   --   --   --   --   --   --   PROT  --  5.2* 3.7*  --   --   --   --   --   --   --   --   --   ALBUMIN  --  2.9* 2.1*  --   --   --   --   --   --   --   --   --   INR  --  1.02  --   --   --   --   --   --   --   --   --   --   TROPONINI  --   --   --   --  0.62* 0.99*  --   --  1.10*  --   --   --   PROBNP 741.6*  --   --   --   --   --   --   --   --   --   --   --   PHART  --   --   --   --   --   --   --   --   --   --  7.342*  --   PCO2ART  --   --   --   --   --   --   --   --   --   --  39.8  --   PO2ART  --   --   --   --   --   --   --   --   --   --  186.0*  --   < > = values in this interval not displayed.  Recent Labs Lab 01/23/13 1602 01/23/13 1959 01/23/13 2337 01/24/13 0357 01/24/13 0803  GLUCAP 133* 127* 148* 112* 106*    Imaging: Dg Chest 2 View  01/22/2013   *RADIOLOGY REPORT*  Clinical Data: Loss of consciousness.  CHEST - 2 VIEW   Comparison: Chest x-ray 02/03/2012.  Findings: Lung volumes are normal.  No consolidative airspace disease.  No pleural effusions.  No pneumothorax.  No pulmonary nodule or mass noted.  Pulmonary vasculature and the cardiomediastinal silhouette are within normal limits. Atherosclerosis of the thoracic aorta.  Status post median sternotomy.  IMPRESSION: 1. No radiographic evidence of acute cardiopulmonary disease. 2.  Atherosclerosis.   Original Report Authenticated By: Trudie Reed, M.D.   Dg Chest Port 1 View  01/23/2013   *RADIOLOGY REPORT*  Clinical Data: Intubation.  GI bleed.  PORTABLE CHEST - 1 VIEW 6:23 p.m.  Comparison: 01/23/2013 at 5:16 p.m.  Findings: Endotracheal tube has been inserted and the tip is in good position 4.8 cm above the carina.  Right jugular vein catheter tip is in the superior vena cava at the level of the azygos vein. Heart size and vascularity are normal.  Lungs are clear.  No pneumothorax.  IMPRESSION: Endotracheal tube in good position.  Clear lungs.   Original Report Authenticated By: Francene Boyers, M.D.   Dg Chest Port 1 View  01/23/2013   *RADIOLOGY REPORT*  Clinical Data: Right IJ catheter placement.  PORTABLE CHEST - 1 VIEW  Comparison: 01/23/2013.  Findings: The right IJ Cordis is stable.  The IJ catheter tip is in the mid SVC just above the carina.  The cardiac silhouette, mediastinal and hilar contours are stable.  The lungs remain clear. Slightly lower lung volumes with minimal bibasilar atelectasis.  IMPRESSION:  1.  Right IJ catheter tip is in the region of the mid SVC. 2.  Slightly low lung volumes with bibasilar atelectasis.   Original Report Authenticated By: Rudie Meyer, M.D.   Dg Chest Port 1 View  01/23/2013   *RADIOLOGY REPORT*  Clinical Data: Status post central line placement.  PORTABLE CHEST - 1 VIEW  Comparison: Chest x-ray 01/22/2013.  Findings: There is a right-sided internal jugular central venous catheter with tip terminating in the distal right  internal jugular vein.  No pneumothorax.  No acute consolidative airspace disease. No pleural effusions.  Pulmonary vasculature is within normal limits.  Heart size is normal.  Mediastinal contours are unremarkable.  Atherosclerosis in the thoracic aorta.  Status post median sternotomy.  IMPRESSION: 1.  Tip of right internal jugular central venous catheter appears to be in the distal right internal jugular vein.  No pneumothorax. 2.  No radiographic evidence of acute cardiopulmonary disease. 3.  Atherosclerosis.   Original Report Authenticated By: Trudie Reed, M.D.      ASSESSMENT / PLAN:  GASTROINTESTINAL A: Upper GI bleed 2nd to mallory weiss tear. P:   -protonix per GI -advanced diet when okay with GI -if rebleeds, then may need IR to assess for embolization  CARDIOVASCULAR A: Hemorrhagic shock. NSTEMI 2nd to hemorrhagic shock. Hx of CAD,HTN, hyperlipidemia. P:  -wean off pressors to keep MAP > 65 -volume resuscitate with crystalloid/blood -hold atenolol, zetia, lasix, avapro, zocor, plendil -no ASA due to bleeding  HEMATOLOGIC A: Acute blood loss anemia. P:  -serial H/H -transfuse for bleeding or Hb < 7 -SCD for DVT prevention  NEUROLOGIC A:  Acute encephalopathy 2nd to hypotension. Hx  of mild dementia. P:   -precedex while on vent for sedation  RHEUMATOLOGY A: Hx of rheumatoid arthritis. P: -prn fentanyl for pain  PULMONARY A: Acute respiratory failure due to airway protection in setting of GI bleed. P:   -continue vent support for now -f/u CXR  RENAL A:  No acute issues P:   -monitor UOP, BMET  INFECTIOUS A:  No acute issues P:   -monitor clinically  ENDOCRINE A:  No acute issues P:   -monitor blood sugar on BMET   Code status: Okay for short term intubation if needed.  No CPR, no defibrillation.  CC time 35 minutes.  Coralyn Helling, MD [facility] Pulmonary/Critical Care 01/24/2013, 9:33 AM Pager:  [phone] After 3pm call: [phone]

## 2013-01-25 ENCOUNTER — Inpatient Hospital Stay (HOSPITAL_COMMUNITY): Payer: Medicare Other

## 2013-01-25 DIAGNOSIS — K922 Gastrointestinal hemorrhage, unspecified: Secondary | ICD-10-CM

## 2013-01-25 LAB — CBC
HCT: 24.4 % — ABNORMAL LOW (ref 39.0–52.0)
Hemoglobin: 6.8 g/dL — CL (ref 13.0–17.0)
Hemoglobin: 8.6 g/dL — ABNORMAL LOW (ref 13.0–17.0)
MCH: 29.8 pg (ref 26.0–34.0)
MCHC: 34.7 g/dL (ref 30.0–36.0)
MCV: 87.4 fL (ref 78.0–100.0)
Platelets: 85 10*3/uL — ABNORMAL LOW (ref 150–400)
RBC: 2.28 MIL/uL — ABNORMAL LOW (ref 4.22–5.81)
RBC: 2.81 MIL/uL — ABNORMAL LOW (ref 4.22–5.81)
RDW: 15.7 % — ABNORMAL HIGH (ref 11.5–15.5)
WBC: 10 10*3/uL (ref 4.0–10.5)
WBC: 13 10*3/uL — ABNORMAL HIGH (ref 4.0–10.5)

## 2013-01-25 LAB — GLUCOSE, CAPILLARY
Glucose-Capillary: 126 mg/dL — ABNORMAL HIGH (ref 70–99)
Glucose-Capillary: 114 mg/dL — ABNORMAL HIGH (ref 70–99)
Glucose-Capillary: 103 mg/dL — ABNORMAL HIGH (ref 70–99)

## 2013-01-25 LAB — BASIC METABOLIC PANEL
CO2: 23 mEq/L (ref 19–32)
Chloride: 118 mEq/L — ABNORMAL HIGH (ref 96–112)
Glucose, Bld: 111 mg/dL — ABNORMAL HIGH (ref 70–99)
Potassium: 3.5 mEq/L (ref 3.5–5.1)
Sodium: 144 mEq/L (ref 135–145)

## 2013-01-25 LAB — PREPARE RBC (CROSSMATCH)

## 2013-01-25 LAB — HEMOGLOBIN AND HEMATOCRIT, BLOOD
HCT: 20.7 % — ABNORMAL LOW (ref 39.0–52.0)
Hemoglobin: 7.2 g/dL — ABNORMAL LOW (ref 13.0–17.0)

## 2013-01-25 MED ORDER — PHENOL 1.4 % MT LIQD
1.0000 | OROMUCOSAL | Status: DC | PRN
  Administered 2013-01-25: 1 via OROMUCOSAL
  Filled 2013-01-25: qty 177

## 2013-01-25 MED ORDER — WHITE PETROLATUM GEL
Status: AC
  Administered 2013-01-25: 0.2
  Filled 2013-01-25: qty 5

## 2013-01-25 NOTE — Progress Notes (Deleted)
[facility] ADULT ICU REPLACEMENT PROTOCOL FOR AM LAB REPLACEMENT ONLY  The patient does apply for the [facility] Adult ICU Electrolyte Replacment Protocol based on the criteria listed below:   1. Is GFR >/= 40 ml/min? yes  Patient's GFR today is >90 2. Is urine output >/= 0.5 ml/kg/hr for the last 6 hours? yes Patient's UOP is 0.91 ml/kg/hr 3. Is BUN < 60 mg/dL? yes  Patient's BUN today is 38 4. Abnormal electrolyte(s) K 3.5 5. Ordered repletion with:per protocol 6. If a panic level lab has been reported, has the CCM MD in charge been notified? yes.   Physician:  Dr Bea Laura Deterding  [place] 01/25/2013 5:45 AM

## 2013-01-25 NOTE — Progress Notes (Signed)
PULMONARY  / CRITICAL CARE MEDICINE  Name: [name] MRN: [id number] DOB: March 13, 1933    ADMISSION DATE:  01/22/2013 CONSULTATION DATE:  01/22/2013  REFERRING MD :  Adela Glimpse PRIMARY SERVICE: PCCM  CHIEF COMPLAINT:  GI bleed, confusion  BRIEF PATIENT DESCRIPTION: 79 y/o male with dementia who is DNR was admitted on 6/20 through the [facility] ED with a GI bleed and confusion.  He developed combativeness and worsening hypotension so PCCM was consulted.  SIGNIFICANT EVENTS: 6/20 Transfer to ICU, EGD >> rebleed in PM >> VDRF, repeat EGD  STUDIES:  6/20 EGD >> mallory weiss   ANTIBIOTICS 6/20 Rocephin >>   LINES / TUBES: 6/20 R IJ Cordis >> 6/20 ETT >> 6/22  SUBJECTIVE:  Tolerating SBT.  VITAL SIGNS: Temp:  [97.5 F (36.4 C)-99.5 F (37.5 C)] 97.6 F (36.4 C) (06/22 0700) Pulse Rate:  [40-84] 52 (06/22 0900) Resp:  [11-26] 16 (06/22 0900) BP: (90-167)/(31-110) 140/54 mmHg (06/22 0900) SpO2:  [98 %-100 %] 100 % (06/22 0900) FiO2 (%):  [30 %] 30 % (06/22 0800) VENT SETTINGS: Vent Mode:  [-] CPAP;PSV FiO2 (%):  [30 %] 30 % Set Rate:  [14 bmp] 14 bmp Vt Set:  [500 mL] 500 mL PEEP:  [5 cmH20] 5 cmH20 Pressure Support:  [5 cmH20] 5 cmH20 Plateau Pressure:  [11 cmH20-15 cmH20] 11 cmH20 INTAKE / OUTPUT: Intake/Output     06/21 0701 - 06/22 0700 06/22 0701 - 06/23 0700   I.V. (mL/kg) 3124.6 (52.1) 224 (3.7)   Blood 91.3    IV Piggyback 50    Total Intake(mL/kg) 3265.8 (54.4) 224 (3.7)   Urine (mL/kg/hr) 1075 (0.7) 225 (0.9)   Stool 2 (0)    Total Output 1077 225   Net +2188.8 -1        Urine Occurrence 1 x      PHYSICAL EXAMINATION:  Gen: no distress HEENT: ETT in place PULM: no wheeze CV: regular AB: soft, non tender Ext: no edema Derm: no rash or skin breakdown Neuro: intermittently agitated, mildly confused   LABS:  Recent Labs Lab 01/22/13 2220 01/22/13 2356 01/23/13 0630  01/23/13 1130 01/23/13 1214  01/23/13 1814  01/23/13 2212 01/24/13 0440  01/24/13 1500 01/25/13 0430 01/25/13 1025  HGB 7.5*  --   --   < >  --   --   < >  --   < >  --  9.1* 7.5* 6.8* 8.6*  WBC 9.0  --   --   --   --   --   --   --   --   --  13.7*  --  9.1 10.0  PLT 183  --   --   --   --   --   --   --   --   --  73*  --  57* 65*  NA 140  --  146*  --   --   --   --   --   --   --  145  --  144  --   K 4.1  --  4.0  --   --   --   --   --   --   --  3.7  --  3.5  --   CL 100  --  112  --   --   --   --   --   --   --  116*  --  118*  --   CO2  34*  --  26  --   --   --   --   --   --   --  25  --  23  --   GLUCOSE 124*  --  108*  --   --   --   --   --   --   --  107*  --  111*  --   BUN 61*  --  69*  --   --   --   --   --   --   --  59*  --  38*  --   CREATININE 1.20  --  1.05  --   --   --   --   --   --   --  0.98  --  0.80  --   CALCIUM 8.8  --  6.9*  --   --   --   --   --   --   --  6.8*  --  6.9*  --   MG  --   --  1.6  --   --   --   --   --   --   --   --   --   --   --   PHOS  --   --  3.3  --   --   --   --   --   --   --   --   --   --   --   AST  --  14 12  --   --   --   --   --   --   --   --   --   --   --   ALT  --  11 9  --   --   --   --   --   --   --   --   --   --   --   ALKPHOS  --  40 28*  --   --   --   --   --   --   --   --   --   --   --   BILITOT  --  0.1* 0.3  --   --   --   --   --   --   --   --   --   --   --   PROT  --  5.2* 3.7*  --   --   --   --   --   --   --   --   --   --   --   ALBUMIN  --  2.9* 2.1*  --   --   --   --   --   --   --   --   --   --   --   INR  --  1.02  --   --   --   --   --   --   --   --   --   --   --   --   TROPONINI  --   --   --   --  0.62* 0.99*  --  1.10*  --   --   --   --   --   --   PROBNP 741.6*  --   --   --   --   --   --   --   --   --   --   --   --   --  PHART  --   --   --   --   --   --   --   --   --  7.342*  --   --   --   --   PCO2ART  --   --   --   --   --   --   --   --   --  39.8  --   --   --   --   PO2ART  --   --   --   --   --   --   --   --   --  186.0*  --   --   --    --   < > = values in this interval not displayed.  Recent Labs Lab 01/24/13 0803 01/24/13 1213 01/24/13 1523 01/25/13 0400 01/25/13 0743  GLUCAP 106* 122* 129* 114* 118*    Imaging: Dg Chest Port 1 View  01/23/2013   *RADIOLOGY REPORT*  Clinical Data: Intubation.  GI bleed.  PORTABLE CHEST - 1 VIEW 6:23 p.m.  Comparison: 01/23/2013 at 5:16 p.m.  Findings: Endotracheal tube has been inserted and the tip is in good position 4.8 cm above the carina.  Right jugular vein catheter tip is in the superior vena cava at the level of the azygos vein. Heart size and vascularity are normal.  Lungs are clear.  No pneumothorax.  IMPRESSION: Endotracheal tube in good position.  Clear lungs.   Original Report Authenticated By: Francene Boyers, M.D.   Dg Chest Port 1 View  01/23/2013   *RADIOLOGY REPORT*  Clinical Data: Right IJ catheter placement.  PORTABLE CHEST - 1 VIEW  Comparison: 01/23/2013.  Findings: The right IJ Cordis is stable.  The IJ catheter tip is in the mid SVC just above the carina.  The cardiac silhouette, mediastinal and hilar contours are stable.  The lungs remain clear. Slightly lower lung volumes with minimal bibasilar atelectasis.  IMPRESSION:  1.  Right IJ catheter tip is in the region of the mid SVC. 2.  Slightly low lung volumes with bibasilar atelectasis.   Original Report Authenticated By: Rudie Meyer, M.D.      ASSESSMENT / PLAN:  GASTROINTESTINAL A: Upper GI bleed 2nd to mallory weiss tear. P:   -protonix per GI -advanced diet when okay with GI -if rebleeds, then may need IR to assess for embolization  CARDIOVASCULAR A: Hemorrhagic shock. NSTEMI 2nd to hemorrhagic shock. Hx of CAD,HTN, hyperlipidemia. P:  -volume resuscitate with crystalloid/blood -hold atenolol, zetia, lasix, avapro, zocor, plendil -no ASA due to bleeding  HEMATOLOGIC A: Acute blood loss anemia. Thrombocytopenia. P:  -serial H/H -transfuse for bleeding or Hb < 7 -SCD for DVT  prevention  NEUROLOGIC A:  Acute encephalopathy 2nd to hypotension. Hx of mild dementia. P:   -wean off as tolerated precedex  RHEUMATOLOGY A: Hx of rheumatoid arthritis. P: -prn fentanyl for pain  PULMONARY A: Acute respiratory failure due to airway protection in setting of GI bleed. P:   -extubate 6/22 -f/u CXR  RENAL A:  No acute issues P:   -monitor UOP, BMET  INFECTIOUS A:  No acute issues P:   -rocephin for prophylaxis after EGD intervention  ENDOCRINE A:  No acute issues P:   -monitor blood sugar on BMET   Code status: Okay for short term intubation if needed.  No CPR, no defibrillation.  CC time 35 minutes.  Coralyn Helling, MD [facility] Pulmonary/Critical Care 01/25/2013, 10:59 AM Pager:  [phone] After 3pm call: [phone]

## 2013-01-25 NOTE — Progress Notes (Signed)
eLink Physician-Brief Progress Note Patient Name: [name] DOB: 04-14-1933 MRN: [id number]  Date of Service  01/25/2013   HPI/Events of Note  Hgb 6.8 down from 7.5 but no overt bleeding.  Abd soft, non-tender.  HD stable off pressors.  eICU Interventions  Plan: Transfuse 1 unit of pRBC Post-transfusion CBC   Intervention Category Intermediate Interventions: Bleeding - evaluation and treatment with blood products  Kekoa Fyock 01/25/2013, 5:49 AM

## 2013-01-25 NOTE — Progress Notes (Signed)
CRITICAL VALUE ALERT  Critical value received:  Hgb= 6.8  Date of notification:  01/25/13  Time of notification:  0540  Critical value read back:yes  Nurse who received alert:  Dustin Flock RN  MD notified (1st page):  Dr Bea Laura Deterding  Time of first page:  [phone]  MD notified (2nd page):  Time of second page:  Responding MD:  Dr Holland Commons  Time MD responded:  [phone]

## 2013-01-25 NOTE — Procedures (Signed)
Extubation Procedure Note  Patient Details:   Name: [name] DOB: 12-Feb-1933 MRN: [id number]   Airway Documentation:     Evaluation  O2 sats: stable throughout Complications: No apparent complications Patient did tolerate procedure well. Bilateral Breath Sounds: Diminished;Rhonchi Suctioning: Airway Yes  Patient's airway and mouth suctioned out throughly, deflated cuff with positive cuff leak, extubated and placed on 2LNC. HR-68 98% RR-24 114/54   Adolm Joseph 01/25/2013, 11:55 AM

## 2013-01-25 NOTE — Progress Notes (Signed)
Patient ID: HUBBERT LANDRIGAN, male   DOB: 10-21-1932, 79 y.o.   MRN: [id number] [facility] Gastroenterology Progress Note  JAYDIN BONIFACE 79 y.o. [birth year]   Subjective: Black tarry stool this morning. Hgb 8.6 (6.8). Weaned off pressors yesterday.  Objective: Vital signs in last 24 hours: Filed Vitals:   01/25/13 0900  BP: 140/54  Pulse: 52  Temp: 97.6  Resp: 16    Physical Exam: Gen: alert, intubated, no acute distress Abd: soft, nontender, nondistended Rectal: deferred  Lab Results:  Recent Labs  01/22/13 2220 01/23/13 0630 01/24/13 0440 01/25/13 0430  NA 140 146* 145 144  K 4.1 4.0 3.7 3.5  CL 100 112 116* 118*  CO2 34* 26 25 23   GLUCOSE 124* 108* 107* 111*  BUN 61* 69* 59* 38*  CREATININE 1.20 1.05 0.98 0.80  CALCIUM 8.8 6.9* 6.8* 6.9*  MG  --  1.6  --   --   PHOS  --  3.3  --   --     Recent Labs  01/22/13 2356 01/23/13 0630  AST 14 12  ALT 11 9  ALKPHOS 40 28*  BILITOT 0.1* 0.3  PROT 5.2* 3.7*  ALBUMIN 2.9* 2.1*    Recent Labs  01/25/13 0430 01/25/13 1025  WBC 9.1 10.0  HGB 6.8* 8.6*  HCT 19.7* 24.4*  MCV 86.4 86.8  PLT 57* 65*    Recent Labs  01/22/13 2356  LABPROT 13.3  INR 1.02      Assessment/Plan: Upper GI bleed likely due to Mallory-Weiss tear. Hgb dropped to 6.8 and no overt bleeding yesterday reported until large tarry stool this morning. Continue Protonix drip. Supportive care. If rebleeds, then would recommend angiogram as next step as previously noted. Our team will continue to follow. Extubation planned this morning. If POs desired then sips of water and ice chips ok but would not advance beyond that today.   Lacheryl Niesen C. 01/25/2013, 11:30 AM

## 2013-01-26 ENCOUNTER — Inpatient Hospital Stay (HOSPITAL_COMMUNITY): Payer: Medicare Other

## 2013-01-26 DIAGNOSIS — I214 Non-ST elevation (NSTEMI) myocardial infarction: Secondary | ICD-10-CM

## 2013-01-26 DIAGNOSIS — J9600 Acute respiratory failure, unspecified whether with hypoxia or hypercapnia: Secondary | ICD-10-CM

## 2013-01-26 LAB — CBC
HCT: 17.9 % — ABNORMAL LOW (ref 39.0–52.0)
Hemoglobin: 7.1 g/dL — ABNORMAL LOW (ref 13.0–17.0)
Hemoglobin: 7.3 g/dL — ABNORMAL LOW (ref 13.0–17.0)
Hemoglobin: 6.3 g/dL — CL (ref 13.0–17.0)
MCH: 31 pg (ref 26.0–34.0)
MCH: 31 pg (ref 26.0–34.0)
MCHC: 34.6 g/dL (ref 30.0–36.0)
MCHC: 34.9 g/dL (ref 30.0–36.0)
MCHC: 35.2 g/dL (ref 30.0–36.0)
MCHC: 36.4 g/dL — ABNORMAL HIGH (ref 30.0–36.0)
MCHC: 35.6 g/dL (ref 30.0–36.0)
MCV: 88.2 fL (ref 78.0–100.0)
Platelets: 75 10*3/uL — ABNORMAL LOW (ref 150–400)
Platelets: 67 10*3/uL — ABNORMAL LOW (ref 150–400)
Platelets: 71 10*3/uL — ABNORMAL LOW (ref 150–400)
Platelets: 73 10*3/uL — ABNORMAL LOW (ref 150–400)
Platelets: 84 10*3/uL — ABNORMAL LOW (ref 150–400)
RBC: 2.38 MIL/uL — ABNORMAL LOW (ref 4.22–5.81)
RBC: 2.03 MIL/uL — ABNORMAL LOW (ref 4.22–5.81)
RBC: 2.81 MIL/uL — ABNORMAL LOW (ref 4.22–5.81)
RDW: 14.7 % (ref 11.5–15.5)
RDW: 14.4 % (ref 11.5–15.5)
RDW: 14.5 % (ref 11.5–15.5)
RDW: 15 % (ref 11.5–15.5)
WBC: 13.5 10*3/uL — ABNORMAL HIGH (ref 4.0–10.5)
WBC: 12.7 10*3/uL — ABNORMAL HIGH (ref 4.0–10.5)

## 2013-01-26 LAB — BASIC METABOLIC PANEL
CO2: 17 mEq/L — ABNORMAL LOW (ref 19–32)
GFR calc non Af Amer: 63 mL/min — ABNORMAL LOW (ref >90)
Glucose, Bld: 137 mg/dL — ABNORMAL HIGH (ref 70–99)
Potassium: 3.8 mEq/L (ref 3.5–5.1)
Sodium: 145 mEq/L (ref 135–145)

## 2013-01-26 LAB — GLUCOSE, CAPILLARY
Glucose-Capillary: 102 mg/dL — ABNORMAL HIGH (ref 70–99)
Glucose-Capillary: 134 mg/dL — ABNORMAL HIGH (ref 70–99)
Glucose-Capillary: 142 mg/dL — ABNORMAL HIGH (ref 70–99)
Glucose-Capillary: 142 mg/dL — ABNORMAL HIGH (ref 70–99)

## 2013-01-26 LAB — TYPE AND SCREEN
Unit division: 0
Unit division: 0
Unit division: 0

## 2013-01-26 LAB — PROTIME-INR
INR: 1.77 — ABNORMAL HIGH (ref 0.00–1.49)
Prothrombin Time: 20 seconds — ABNORMAL HIGH (ref 11.6–15.2)

## 2013-01-26 LAB — APTT: aPTT: 33 seconds (ref 24–37)

## 2013-01-26 LAB — PREPARE RBC (CROSSMATCH)

## 2013-01-26 MED ORDER — TECHNETIUM TC 99M-LABELED RED BLOOD CELLS IV KIT
25.0000 | PACK | Freq: Once | INTRAVENOUS | Status: AC | PRN
  Administered 2013-01-26: 25 via INTRAVENOUS

## 2013-01-26 NOTE — Progress Notes (Signed)
PULMONARY  / CRITICAL CARE MEDICINE  Name: [name] MRN: [id number] DOB: June 26, 1933    ADMISSION DATE:  01/22/2013 CONSULTATION DATE:  01/22/2013  REFERRING MD :  Adela Glimpse PRIMARY SERVICE: PCCM  CHIEF COMPLAINT:  GI bleed, confusion  BRIEF PATIENT DESCRIPTION: 79 y/o male with dementia who is DNR was admitted on 6/20 through the [facility] ED with a GI bleed and confusion.  He developed combativeness and worsening hypotension so PCCM was consulted.  SIGNIFICANT EVENTS: 6/20 Transfer to ICU, EGD >> rebleed in PM >> VDRF, repeat EGD 6/23 2 units PRBCs for hemorrhagic shock, Hgb 6.3 and recent NSTEMI. One unit mixed plts for plt 71K   STUDIES:  6/20 EGD >> mallory weiss   ANTIBIOTICS 6/20 Rocephin >> 6/23  LINES / TUBES: 6/20 R IJ CVL >>  6/20 ETT >> 6/22  SUBJECTIVE:  Poorly oriented. Not F/C. Mildly dyspneic appearing. BRBPR  VITAL SIGNS: Temp:  [97.7 F (36.5 C)-98.7 F (37.1 C)] 98.2 F (36.8 C) (06/23 1230) Pulse Rate:  [70-139] 82 (06/23 1300) Resp:  [18-33] 23 (06/23 1407) BP: (53-214)/(34-198) 161/58 mmHg (06/23 1407) SpO2:  [96 %-100 %] 100 % (06/23 1407) FiO2 (%):  [30 %] 30 % (06/22 1800) VENT SETTINGS: Vent Mode:  [-]  FiO2 (%):  [30 %] 30 % INTAKE / OUTPUT: Intake/Output     06/22 0701 - 06/23 0700 06/23 0701 - 06/24 0700   I.V. (mL/kg) 2685.5 (44.8) 450 (7.5)   Blood 891.7 895   IV Piggyback 50    Total Intake(mL/kg) 3627.2 (60.5) 1345 (22.4)   Urine (mL/kg/hr) 1750 (1.2) 50 (0.1)   Stool 156 (0.1)    Total Output 1906 50   Net +1721.2 +1295          PHYSICAL EXAMINATION:  Gen: mildly dyspneic appearing, Not F/C HEENT: WNL PULM: no wheeze CV: regular AB: soft, non tender Ext: no edema Derm: no rash or skin breakdown Neuro: intermittently agitated, mildly confused   LABS:  Recent Labs Lab 01/22/13 2220 01/22/13 2356 01/23/13 0630  01/23/13 1130 01/23/13 1214  01/23/13 1814  01/23/13 2212 01/24/13 0440  01/25/13 0430  01/26/13 0452  01/26/13 0807 01/26/13 1240  HGB 7.5*  --   --   < >  --   --   < >  --   < >  --  9.1*  < > 6.8*  < > 7.3* 6.3* 8.7*  WBC 9.0  --   --   --   --   --   --   --   --   --  13.7*  --  9.1  < > 12.9* 13.5* 16.0*  PLT 183  --   --   --   --   --   --   --   --   --  73*  --  57*  < > 67* 71* 73*  NA 140  --  146*  --   --   --   --   --   --   --  145  --  144  --  145  --   --   K 4.1  --  4.0  --   --   --   --   --   --   --  3.7  --  3.5  --  3.8  --   --   CL 100  --  112  --   --   --   --   --   --   --  116*  --  118*  --  122*  --   --   CO2 34*  --  26  --   --   --   --   --   --   --  25  --  23  --  17*  --   --   GLUCOSE 124*  --  108*  --   --   --   --   --   --   --  107*  --  111*  --  137*  --   --   BUN 61*  --  69*  --   --   --   --   --   --   --  59*  --  38*  --  39*  --   --   CREATININE 1.20  --  1.05  --   --   --   --   --   --   --  0.98  --  0.80  --  1.09  --   --   CALCIUM 8.8  --  6.9*  --   --   --   --   --   --   --  6.8*  --  6.9*  --  6.7*  --   --   MG  --   --  1.6  --   --   --   --   --   --   --   --   --   --   --   --   --   --   PHOS  --   --  3.3  --   --   --   --   --   --   --   --   --   --   --   --   --   --   AST  --  14 12  --   --   --   --   --   --   --   --   --   --   --   --   --   --   ALT  --  11 9  --   --   --   --   --   --   --   --   --   --   --   --   --   --   ALKPHOS  --  40 28*  --   --   --   --   --   --   --   --   --   --   --   --   --   --   BILITOT  --  0.1* 0.3  --   --   --   --   --   --   --   --   --   --   --   --   --   --   PROT  --  5.2* 3.7*  --   --   --   --   --   --   --   --   --   --   --   --   --   --   ALBUMIN  --  2.9* 2.1*  --   --   --   --   --   --   --   --   --   --   --   --   --   --  APTT  --   --   --   --   --   --   --   --   --   --   --   --   --   --   --   --  33  INR  --  1.02  --   --   --   --   --   --   --   --   --   --   --   --   --   --  1.77*  TROPONINI  --   --   --    --  0.62* 0.99*  --  1.10*  --   --   --   --   --   --   --   --   --   PROBNP 741.6*  --   --   --   --   --   --   --   --   --   --   --   --   --   --   --   --   PHART  --   --   --   --   --   --   --   --   --  7.342*  --   --   --   --   --   --   --   PCO2ART  --   --   --   --   --   --   --   --   --  39.8  --   --   --   --   --   --   --   PO2ART  --   --   --   --   --   --   --   --   --  186.0*  --   --   --   --   --   --   --   < > = values in this interval not displayed.  Recent Labs Lab 01/25/13 2031 01/26/13 0010 01/26/13 0350 01/26/13 0736 01/26/13 1134  GLUCAP 110* 142* 134* 142* 117*    CXR: NNF    ASSESSMENT / PLAN:  GASTROINTESTINAL A: Upper GI bleed 2nd to mallory weiss tear. BRBPR > unusual to bleed this much from [facility] tear. Concern for possible other source P:   -Cont PPI -Tagged RBC study to look for other possible sources -Correct coagulopathy  CARDIOVASCULAR A: Hemorrhagic shock. NSTEMI 2nd to hemorrhagic shock. Hx of CAD,HTN, hyperlipidemia. P:  -volume resuscitate with crystalloid/blood as indicated -no ASA due to bleeding  HEMATOLOGIC A: Acute blood loss anemia. Thrombocytopenia. P:  -2 units PRBCs 6/23 for hemorrhagic shock and MSTEMI -Transfuse plts to keep plts > 100k -Goal Hgb should be > 8.5 in this setting  NEUROLOGIC A:  Acute encephalopathy Hx of dementia. P:   -Monitor and treat conservatively  RHEUMATOLOGY A: Hx of rheumatoid arthritis. P: -prn fentanyl for pain  PULMONARY A: Acute respiratory failure, resolved P:   -f/u CXR intermittently  RENAL A:  Acute renal insuff due to hypotension P:   -monitor UOP, BMET -Not a candidate for HD  INFECTIOUS A:  No acute issues P:   -micro and abx as above  ENDOCRINE A:  No acute issues P:   -monitor blood sugar on BMET   Daughter updated over phone. She re-iterated her desire for  NCB in event of cardiopulmonary arrest. She also indicated the he would not  wish to undergo major surgery under any circumstances. Endoscopy for GIB (if another site of bleeding were to be found) would be acceptable. Would have to address whether repeat intubation would be required for such. I explained that we will have to put some upper limit on the amount of blood products transfused. She understands and accepts this reality.  CC time 35 minutes.  Billy Fischer, MD ; [facility] [phone].  After 5:30 PM or weekends, call [phone]

## 2013-01-26 NOTE — Progress Notes (Signed)
eLink Physician-Brief Progress Note Patient Name: [name] DOB: 06-04-1933 MRN: [id number]  Date of Service  01/26/2013   HPI/Events of Note   Hgb 8.3 Plt 84K  RBC tagged study neg  eICU Interventions  tfx one unit prbc/plt    Intervention Category Major Interventions: Hemorrhage - evaluation and management  Shan Levans 01/26/2013, 9:59 PM

## 2013-01-26 NOTE — Progress Notes (Signed)
Pt c/o SOB HR 130's ST, SBP elevated, Dr Marin Shutter notified CBC to be drawn. Pt continues with black loose stools

## 2013-01-26 NOTE — Progress Notes (Signed)
Patient has a lot of blood coming from the rectal tube and it looks redish marroon. He has a Mallory Weiss Tear, but I'd like to get a GI bleeding scan to see if we can see another site of bleeding, as this seems like a lot of blood in the rectal tube to be coming from a [employer] tear.

## 2013-01-27 ENCOUNTER — Encounter (HOSPITAL_COMMUNITY): Payer: Self-pay | Admitting: Gastroenterology

## 2013-01-27 LAB — TYPE AND SCREEN
ABO/RH(D): O POS
Unit division: 0

## 2013-01-27 LAB — PROTIME-INR: INR: 1.64 — ABNORMAL HIGH (ref 0.00–1.49)

## 2013-01-27 LAB — CBC
HCT: 25 % — ABNORMAL LOW (ref 39.0–52.0)
HCT: 24.3 % — ABNORMAL LOW (ref 39.0–52.0)
Hemoglobin: 8.9 g/dL — ABNORMAL LOW (ref 13.0–17.0)
Hemoglobin: 8.4 g/dL — ABNORMAL LOW (ref 13.0–17.0)
MCH: 30.3 pg (ref 26.0–34.0)
MCHC: 35 g/dL (ref 30.0–36.0)
MCHC: 35.4 g/dL (ref 30.0–36.0)
MCV: 85.6 fL (ref 78.0–100.0)
Platelets: 110 10*3/uL — ABNORMAL LOW (ref 150–400)
Platelets: 113 10*3/uL — ABNORMAL LOW (ref 150–400)
RDW: 15.7 % — ABNORMAL HIGH (ref 11.5–15.5)
WBC: 13.4 10*3/uL — ABNORMAL HIGH (ref 4.0–10.5)
WBC: 11.3 10*3/uL — ABNORMAL HIGH (ref 4.0–10.5)

## 2013-01-27 LAB — APTT: aPTT: 31 seconds (ref 24–37)

## 2013-01-27 LAB — GLUCOSE, CAPILLARY
Glucose-Capillary: 90 mg/dL (ref 70–99)
Glucose-Capillary: 94 mg/dL (ref 70–99)

## 2013-01-27 LAB — PREPARE PLATELET PHERESIS

## 2013-01-27 MED ORDER — PANTOPRAZOLE SODIUM 40 MG IV SOLR
40.0000 mg | Freq: Two times a day (BID) | INTRAVENOUS | Status: DC
  Administered 2013-01-27 – 2013-01-28 (×2): 40 mg via INTRAVENOUS
  Filled 2013-01-27 (×5): qty 40

## 2013-01-27 MED ORDER — DEXTROSE 5 % IV SOLN
INTRAVENOUS | Status: DC
  Administered 2013-01-27: 50 mL via INTRAVENOUS
  Administered 2013-01-28: 06:00:00 via INTRAVENOUS

## 2013-01-27 NOTE — Care Management Note (Addendum)
    Page 1 of 2   02/02/2013     12:13:47 PM   CARE MANAGEMENT NOTE 02/02/2013  Patient:  [name], [name]   Account Number:  [id number]  Date Initiated:  01/23/2013  Documentation initiated by:  [name]  Subjective/Objective Assessment:   hypotensive - GIB     Action/Plan:   pt eval- rec hhpt  will also need aide and social worker   Anticipated DC Date:  02/02/2013   Anticipated DC Plan:  HOME W HOME HEALTH SERVICES      DC Planning Services  CM consult      [facility] Choice  HOME HEALTH   Choice offered to / List presented to:  C-4 Adult Children        HH arranged  HH-2 PT  HH-4 NURSE'S AIDE  HH-6 SOCIAL WORKER      HH agency  [facility]   Status of service:  Completed, signed off Medicare Important Message given?   (If response is "NO", the following Medicare IM given date fields will be blank) Date Medicare IM given:   Date Additional Medicare IM given:    Discharge Disposition:  HOME W HOME HEALTH SERVICES  Per UR Regulation:  Reviewed for med. necessity/level of care/duration of stay  If discussed at Long Length of Stay Meetings, dates discussed:    Comments:  Contact:  [name] Daughter [phone]                [name] Sister [phone]                 [name] [phone]  02/02/13 12:11 Steve Cape RN, BSN [phone] patient has daughter who lives with him, she is there 24 hrs /day, patient chose [facility] from agency list, referral made for HHPT, aide and social worker for community resources. Patient is for dc today, his daughter will transport him home.  Soc will begin 24-48 hrs post discharge.  6/27 1342 Steve dowell rn,bsn spoke w pt and da [name]. da w pt aroung the clock. da does not want snf but would be agreeable to hhc. no pref to hhc agencies as long as take [facility] mc. ref to [name] w [facility] for hhpt. will await final orders by md. da states has eq that is needed.   01-27-13 12noon [name], RNBSN [phone] Talked with patient - states lives at home with daughter [name] who is a PT who does not work and is at home with him.  States has walker and cane at home that he uses.  Has "scooter" - confirmed yes to electric W/C and states has some other electric equipment also.  Plans to go home with family - has never has HH before.  CM will continue to follow for further needs.

## 2013-01-27 NOTE — Progress Notes (Addendum)
PULMONARY  / CRITICAL CARE MEDICINE  Name: JAMILLE YOSHINO MRN: [id number] DOB: 11-Jul-1933    ADMISSION DATE:  01/22/2013 CONSULTATION DATE:  01/22/2013  REFERRING MD :  Adela Glimpse PRIMARY SERVICE: PCCM  CHIEF COMPLAINT:  GI bleed, confusion  BRIEF PATIENT DESCRIPTION: 79 y/o male with dementia who is DNR was admitted on 6/20 through the [facility] ED with a GI bleed and confusion.  He developed combativeness and worsening hypotension so PCCM was consulted.  SIGNIFICANT EVENTS: 6/20 Transfer to ICU, EGD >> rebleed in PM >> VDRF, repeat EGD 6/23 2 units PRBCs for hemorrhagic shock, Hgb 6.3 and recent NSTEMI. One unit mixed plts for plt 71K   STUDIES:  6/20 EGD >> mallory weiss  6/23 Tagged RBC scan: negative   ANTIBIOTICS 6/20 Rocephin >> 6/23  LINES / TUBES: 6/20 R IJ CVL >>  6/20 ETT >> 6/22  SUBJECTIVE:  Better oriented. + F/C. No distress. Reduced bleeding from rectum  VITAL SIGNS: Temp:  [98 F (36.7 C)-99.1 F (37.3 C)] 98 F (36.7 C) (06/24 1200) Pulse Rate:  [54-100] 65 (06/24 1300) Resp:  [20-29] 23 (06/24 1300) BP: (115-161)/(40-99) 137/46 mmHg (06/24 1300) SpO2:  [99 %-100 %] 100 % (06/24 1300) VENT SETTINGS:   INTAKE / OUTPUT: Intake/Output     06/23 0701 - 06/24 0700 06/24 0701 - 06/25 0700   P.O.  250   I.V. (mL/kg) 1410 (23.5) 285 (4.8)   Blood 1295    IV Piggyback     Total Intake(mL/kg) 2705 (45.1) 535 (8.9)   Urine (mL/kg/hr) 1005 (0.7) 925 (1.7)   Stool 400 (0.3)    Total Output 1405 925   Net +1300 -390          PHYSICAL EXAMINATION:  Gen: weak and frail, NAD HEENT: WNL PULM: no wheeze CV: regular AB: soft, non tender Ext: no edema Derm: no rash or skin breakdown Neuro: MAEs, + F/C   LABS:  Recent Labs Lab 01/22/13 2220 01/22/13 2356 01/23/13 0630  01/23/13 1130 01/23/13 1214  01/23/13 1814  01/23/13 2212 01/24/13 0440  01/25/13 0430  01/26/13 0452  01/26/13 1240 01/26/13 1835 01/27/13 0600 01/27/13 1455  HGB 7.5*  --   --    < >  --   --   < >  --   < >  --  9.1*  < > 6.8*  < > 7.3*  < > 8.7* 8.3* 8.9* 8.5*  WBC 9.0  --   --   --   --   --   --   --   --   --  13.7*  --  9.1  < > 12.9*  < > 16.0* 12.7* 13.4* 11.3*  PLT 183  --   --   --   --   --   --   --   --   --  73*  --  57*  < > 67*  < > 73* 84* 107* 110*  NA 140  --  146*  --   --   --   --   --   --   --  145  --  144  --  145  --   --   --   --   --   K 4.1  --  4.0  --   --   --   --   --   --   --  3.7  --  3.5  --  3.8  --   --   --   --   --  CL 100  --  112  --   --   --   --   --   --   --  116*  --  118*  --  122*  --   --   --   --   --   CO2 34*  --  26  --   --   --   --   --   --   --  25  --  23  --  17*  --   --   --   --   --   GLUCOSE 124*  --  108*  --   --   --   --   --   --   --  107*  --  111*  --  137*  --   --   --   --   --   BUN 61*  --  69*  --   --   --   --   --   --   --  59*  --  38*  --  39*  --   --   --   --   --   CREATININE 1.20  --  1.05  --   --   --   --   --   --   --  0.98  --  0.80  --  1.09  --   --   --   --   --   CALCIUM 8.8  --  6.9*  --   --   --   --   --   --   --  6.8*  --  6.9*  --  6.7*  --   --   --   --   --   MG  --   --  1.6  --   --   --   --   --   --   --   --   --   --   --   --   --   --   --   --   --   PHOS  --   --  3.3  --   --   --   --   --   --   --   --   --   --   --   --   --   --   --   --   --   AST  --  14 12  --   --   --   --   --   --   --   --   --   --   --   --   --   --   --   --   --   ALT  --  11 9  --   --   --   --   --   --   --   --   --   --   --   --   --   --   --   --   --   ALKPHOS  --  40 28*  --   --   --   --   --   --   --   --   --   --   --   --   --   --   --   --   --  BILITOT  --  0.1* 0.3  --   --   --   --   --   --   --   --   --   --   --   --   --   --   --   --   --   PROT  --  5.2* 3.7*  --   --   --   --   --   --   --   --   --   --   --   --   --   --   --   --   --   ALBUMIN  --  2.9* 2.1*  --   --   --   --   --   --   --   --   --   --   --   --    --   --   --   --   --   APTT  --   --   --   --   --   --   --   --   --   --   --   --   --   --   --   --  33  --  31  --   INR  --  1.02  --   --   --   --   --   --   --   --   --   --   --   --   --   --  1.77*  --  1.64*  --   TROPONINI  --   --   --   --  0.62* 0.99*  --  1.10*  --   --   --   --   --   --   --   --   --   --   --   --   PROBNP 741.6*  --   --   --   --   --   --   --   --   --   --   --   --   --   --   --   --   --   --   --   PHART  --   --   --   --   --   --   --   --   --  7.342*  --   --   --   --   --   --   --   --   --   --   PCO2ART  --   --   --   --   --   --   --   --   --  39.8  --   --   --   --   --   --   --   --   --   --   PO2ART  --   --   --   --   --   --   --   --   --  186.0*  --   --   --   --   --   --   --   --   --   --   < > = values in this interval not displayed.  Recent Labs Lab 01/26/13 1134  01/26/13 1654 01/26/13 1929 01/26/13 2353 01/27/13 0711  GLUCAP 117* 93 102* 94 90    CXR: NNF    ASSESSMENT / PLAN:  GASTROINTESTINAL A: Upper GI bleed 2nd to mallory weiss tear. BRBPR > unusual to bleed this much from [facility] tear. Concern for possible other source. Neg tagged RBC study 6/23 P:   -Cont PPI > change to intermittent dosing 6/24 -Correct coagulopathies as needed -GI following  CARDIOVASCULAR A: Hemorrhagic shock. NSTEMI 2nd to hemorrhagic shock. Hx of CAD,HTN, hyperlipidemia. P:  -volume resuscitate with crystalloid/blood as indicated -no ASA or heparin due to bleeding  HEMATOLOGIC A: Acute blood loss anemia. Thrombocytopenia. P:  -transfuse RBCs for acute blood loss or to keep Hgb > 8.5 -Transfuse plts to keep plts > 100k  NEUROLOGIC A:  Acute encephalopathy Hx of dementia. P:   -Monitor and treat conservatively  RHEUMATOLOGY A: Hx of rheumatoid arthritis. P: -Cont prn fentanyl for pain  PULMONARY A: Acute respiratory failure, resolved P:   -f/u CXR intermittently  RENAL A:  Acute renal failure  due to hypotension P:   -monitor UOP, BMET -Not a candidate for HD  INFECTIOUS A:  No acute issues P:   -micro and abx as above  ENDOCRINE A:  No acute issues P:   -monitor blood sugar on BMET   NCB in event of cardiopulm arrest  CC time 35 minutes.  Billy Fischer, MD ; [facility] [phone].  After 5:30 PM or weekends, call [phone]

## 2013-01-27 NOTE — Clinical Documentation Improvement (Signed)
RENAL INSUFFICIENCY DOCUMENTATION CLARIFICATION QUERY  THIS DOCUMENT IS NOT A PERMANENT PART OF THE MEDICAL RECORD  Please update your documentation within the medical record to reflect your response to this query.                                                                                     01/27/13 Dear Dr. Sung Amabile Marton Redwood,   In a better effort to capture your patient's severity of illness, reflect appropriate length of stay and utilization of resources, a review of the patient medical record has revealed the following indicators.    Based on your clinical judgment, please clarify and document in a progress note and/or discharge summary the clinical condition associated with the following supporting information: In your progress note from 01/26/13, you documented "Acute renal insuff due to hypotension.  Monitor UOP, BMET. Not a candidate for HD."  In responding to this query please exercise your independent judgment.  The fact that a query is asked, does not imply that any particular answer is desired or expected.  Possible Clinical Conditions?   _______Acute Renal Failure _______Acute Kidney Injury _______Acute Tubular Necrosis _______Other Condition_____________ _______Cannot Clinically Determine   Lab:  Baseline Cr Level:  0.92 02/03/12  Current Creatinine Level (trend):  1.09      01/26/13     Baseline BUN Level:  12 02/03/12           Current BUN Level (trend):  39      01/26/13   GFR: 01/26/13 GFR 63 01/22/13 GFR  56 02/03/12 GFR 79                  You may use possible, probable, or suspect with inpatient documentation.  Possible, probable, suspected diagnoses MUST be documented at the time of discharge.  Reviewed: additional documentation in the medical record  Thank You,  Darla Lesches, RN, BSN, CCRN Clinical Documentation Specialist Pager [phone]  Office [phone]  HIM department [facility]

## 2013-01-27 NOTE — Progress Notes (Signed)
[facility] Progress Note  Subjective: The patient states he feels much better today.  Objective: Vital signs in last 24 hours: Temp:  [98 F (36.7 C)-99.1 F (37.3 C)] 98.3 F (36.8 C) (06/24 0700) Pulse Rate:  [54-101] 66 (06/24 1000) Resp:  [20-29] 22 (06/24 1000) BP: (115-163)/(41-99) 148/77 mmHg (06/24 1000) SpO2:  [99 %-100 %] 100 % (06/24 1000) Weight change:    PE:  He does not appear in any acute distress  Abdomen is soft and nontender  Hemoglobin has improved  GI bleed scan was negative.  Lab Results: Results for orders placed during the hospital encounter of 01/22/13 (from the past 24 hour(s))  GLUCOSE, CAPILLARY     Status: Abnormal   Collection Time    01/26/13 11:34 AM      Result Value Range   Glucose-Capillary 117 (*) 70 - 99 mg/dL   Comment 1 Documented in Chart     Comment 2 Notify RN    CBC     Status: Abnormal   Collection Time    01/26/13 12:40 PM      Result Value Range   WBC 16.0 (*) 4.0 - 10.5 K/uL   RBC 2.81 (*) 4.22 - 5.81 MIL/uL   Hemoglobin 8.7 (*) 13.0 - 17.0 g/dL   HCT 16.1 (*) 09.6 - 04.5 %   MCV 85.1  78.0 - 100.0 fL   MCH 31.0  26.0 - 34.0 pg   MCHC 36.4 (*) 30.0 - 36.0 g/dL   RDW 40.9  81.1 - 91.4 %   Platelets 73 (*) 150 - 400 K/uL  APTT     Status: None   Collection Time    01/26/13 12:40 PM      Result Value Range   aPTT 33  24 - 37 seconds  PROTIME-INR     Status: Abnormal   Collection Time    01/26/13 12:40 PM      Result Value Range   Prothrombin Time 20.0 (*) 11.6 - 15.2 seconds   INR 1.77 (*) 0.00 - 1.49  GLUCOSE, CAPILLARY     Status: None   Collection Time    01/26/13  4:54 PM      Result Value Range   Glucose-Capillary 93  70 - 99 mg/dL  CBC     Status: Abnormal   Collection Time    01/26/13  6:35 PM      Result Value Range   WBC 12.7 (*) 4.0 - 10.5 K/uL   RBC 2.73 (*) 4.22 - 5.81 MIL/uL   Hemoglobin 8.3 (*) 13.0 - 17.0 g/dL   HCT 78.2 (*) 95.6 - 21.3 %   MCV 85.3  78.0 - 100.0 fL   MCH  30.4  26.0 - 34.0 pg   MCHC 35.6  30.0 - 36.0 g/dL   RDW 08.6  57.8 - 46.9 %   Platelets 84 (*) 150 - 400 K/uL  GLUCOSE, CAPILLARY     Status: Abnormal   Collection Time    01/26/13  7:29 PM      Result Value Range   Glucose-Capillary 102 (*) 70 - 99 mg/dL  PREPARE RBC (CROSSMATCH)     Status: None   Collection Time    01/26/13 10:30 PM      Result Value Range   Order Confirmation ORDER PROCESSED BY BLOOD BANK    PREPARE PLATELET PHERESIS     Status: None   Collection Time    01/26/13 10:30 PM  Result Value Range   Unit Number [id number]     Blood Component Type PLTPHER LR1     Unit division 00     Status of Unit REL FROM [facility]     Transfusion Status OK TO TRANSFUSE     Unit Number [id number]     Blood Component Type PLTPHER LR2     Unit division 00     Status of Unit ISSUED     Transfusion Status OK TO TRANSFUSE    GLUCOSE, CAPILLARY     Status: None   Collection Time    01/26/13 11:53 PM      Result Value Range   Glucose-Capillary 94  70 - 99 mg/dL   Comment 1 Documented in Chart     Comment 2 Notify RN    PROTIME-INR     Status: Abnormal   Collection Time    01/27/13  6:00 AM      Result Value Range   Prothrombin Time 18.9 (*) 11.6 - 15.2 seconds   INR 1.64 (*) 0.00 - 1.49  APTT     Status: None   Collection Time    01/27/13  6:00 AM      Result Value Range   aPTT 31  24 - 37 seconds  CBC     Status: Abnormal   Collection Time    01/27/13  6:00 AM      Result Value Range   WBC 13.4 (*) 4.0 - 10.5 K/uL   RBC 2.93 (*) 4.22 - 5.81 MIL/uL   Hemoglobin 8.9 (*) 13.0 - 17.0 g/dL   HCT 30.8 (*) 65.7 - 84.6 %   MCV 85.3  78.0 - 100.0 fL   MCH 30.4  26.0 - 34.0 pg   MCHC 35.6  30.0 - 36.0 g/dL   RDW 96.2  95.2 - 84.1 %   Platelets 107 (*) 150 - 400 K/uL  GLUCOSE, CAPILLARY     Status: None   Collection Time    01/27/13  7:11 AM      Result Value Range   Glucose-Capillary 90  70 - 99 mg/dL    Studies/Results: @RISRSLT24 @    Assessment: Upper  GI bleed secondary to Mallory-Weiss tear. Seems to be more stable at this time. GI bleed scan was negative.  Plan: Continue supportive care. Transfuse blood as needed.    Maisley Hainsworth F 01/27/2013, 10:39 AM

## 2013-01-28 DIAGNOSIS — R5381 Other malaise: Secondary | ICD-10-CM

## 2013-01-28 DIAGNOSIS — E876 Hypokalemia: Secondary | ICD-10-CM

## 2013-01-28 LAB — BASIC METABOLIC PANEL
BUN: 18 mg/dL (ref 6–23)
Calcium: 7 mg/dL — ABNORMAL LOW (ref 8.4–10.5)
Creatinine, Ser: 0.96 mg/dL (ref 0.50–1.35)
GFR calc Af Amer: 90 mL/min — ABNORMAL LOW (ref >90)
GFR calc non Af Amer: 77 mL/min — ABNORMAL LOW (ref >90)
Glucose, Bld: 194 mg/dL — ABNORMAL HIGH (ref 70–99)
Potassium: 2.9 mEq/L — ABNORMAL LOW (ref 3.5–5.1)

## 2013-01-28 LAB — PREPARE PLATELET PHERESIS
Unit division: 0
Unit division: 0

## 2013-01-28 LAB — CBC
MCH: 30.8 pg (ref 26.0–34.0)
MCHC: 35.4 g/dL (ref 30.0–36.0)
MCV: 87.6 fL (ref 78.0–100.0)
Platelets: 118 10*3/uL — ABNORMAL LOW (ref 150–400)
Platelets: 144 10*3/uL — ABNORMAL LOW (ref 150–400)
RBC: 3.14 MIL/uL — ABNORMAL LOW (ref 4.22–5.81)
RDW: 15.8 % — ABNORMAL HIGH (ref 11.5–15.5)
RDW: 16.8 % — ABNORMAL HIGH (ref 11.5–15.5)
WBC: 10.6 10*3/uL — ABNORMAL HIGH (ref 4.0–10.5)

## 2013-01-28 MED ORDER — POTASSIUM CHLORIDE CRYS ER 20 MEQ PO TBCR
40.0000 meq | EXTENDED_RELEASE_TABLET | Freq: Once | ORAL | Status: AC
  Administered 2013-01-28: 40 meq via ORAL
  Filled 2013-01-28: qty 1

## 2013-01-28 MED ORDER — SODIUM CHLORIDE 0.9 % IV SOLN
INTRAVENOUS | Status: DC
  Administered 2013-01-28: 20:00:00 via INTRAVENOUS
  Administered 2013-01-29: 20 mL/h via INTRAVENOUS
  Administered 2013-02-01 – 2013-02-02 (×2): via INTRAVENOUS

## 2013-01-28 MED ORDER — POTASSIUM CHLORIDE 10 MEQ/50ML IV SOLN
INTRAVENOUS | Status: AC
  Administered 2013-01-28: 10 meq via INTRAVENOUS
  Filled 2013-01-28: qty 200

## 2013-01-28 MED ORDER — POTASSIUM CHLORIDE 10 MEQ/50ML IV SOLN
10.0000 meq | INTRAVENOUS | Status: AC
  Administered 2013-01-28 (×3): 10 meq via INTRAVENOUS

## 2013-01-28 MED ORDER — PANTOPRAZOLE SODIUM 40 MG PO TBEC
40.0000 mg | DELAYED_RELEASE_TABLET | Freq: Every day | ORAL | Status: DC
  Administered 2013-01-29 – 2013-02-02 (×5): 40 mg via ORAL
  Filled 2013-01-28 (×4): qty 1

## 2013-01-28 MED ORDER — OXYCODONE HCL 5 MG PO TABS
5.0000 mg | ORAL_TABLET | ORAL | Status: DC | PRN
  Administered 2013-01-28 – 2013-02-01 (×16): 5 mg via ORAL
  Filled 2013-01-28 (×16): qty 1

## 2013-01-28 MED ORDER — ENSURE COMPLETE PO LIQD
237.0000 mL | Freq: Two times a day (BID) | ORAL | Status: DC
  Administered 2013-01-28 – 2013-02-01 (×10): 237 mL via ORAL

## 2013-01-28 MED ORDER — ACETAMINOPHEN 325 MG PO TABS
650.0000 mg | ORAL_TABLET | ORAL | Status: DC | PRN
  Administered 2013-02-01: 650 mg via ORAL
  Filled 2013-01-28: qty 2

## 2013-01-28 NOTE — Progress Notes (Signed)
PULMONARY  / CRITICAL CARE MEDICINE  Name: [name] MRN: [id number] DOB: 1932-08-07    ADMISSION DATE:  01/22/2013 CONSULTATION DATE:  01/22/2013  REFERRING MD :  Adela Glimpse PRIMARY SERVICE: PCCM  CHIEF COMPLAINT:  GI bleed, confusion  BRIEF PATIENT DESCRIPTION: 79 y/o male with dementia who is DNR was admitted on 6/20 through the [facility] ED with a GI bleed and confusion.  He developed combativeness and worsening hypotension so PCCM was consulted.  SIGNIFICANT EVENTS: 6/20 Transfer to ICU, EGD >> rebleed in PM >> VDRF, repeat EGD 6/23 2 units PRBCs for hemorrhagic shock, Hgb 6.3 and recent NSTEMI. One unit mixed plts for plt 71K   STUDIES:  6/20 EGD >> mallory weiss  6/23 Tagged RBC scan: negative 6/25. No overt bleeding. H/H stable X 24 hrs. Transfer to SDU 6/25 25 beat run of VT. Hypokalemia corrected  ANTIBIOTICS 6/20 Rocephin >> 6/23  LINES / TUBES: 6/20 R IJ CVL >>  6/20 ETT >> 6/22  SUBJECTIVE:  No distress. No new complaints. No further rectal bleeding noted  VITAL SIGNS: Temp:  [97.9 F (36.6 C)-98.4 F (36.9 C)] 98 F (36.7 C) (06/25 0800) Pulse Rate:  [46-79] 47 (06/25 0700) Resp:  [18-27] 25 (06/25 0700) BP: (123-144)/(39-103) 134/39 mmHg (06/25 0700) SpO2:  [97 %-100 %] 100 % (06/25 0700) VENT SETTINGS:   INTAKE / OUTPUT: Intake/Output     06/24 0701 - 06/25 0700 06/25 0701 - 06/26 0700   P.O. 450 600   I.V. (mL/kg) 1135 (18.9) 100 (1.7)   Blood     Total Intake(mL/kg) 1585 (26.4) 700 (11.7)   Urine (mL/kg/hr) 1945 (1.4) 250 (1)   Stool 225 (0.2)    Total Output 2170 250   Net -585 +450        Stool Occurrence 3 x      PHYSICAL EXAMINATION:  Gen: weak and frail, NAD HEENT: WNL PULM: no wheeze CV: regular AB: soft, non tender Ext: no edema Neuro: MAEs, + F/C   LABS:  Recent Labs Lab 01/22/13 2220 01/22/13 2356 01/23/13 0630  01/23/13 1130 01/23/13 1214  01/23/13 1814  01/23/13 2212  01/25/13 0430  01/26/13 0452  01/26/13 1240   01/27/13 0600 01/27/13 1455 01/27/13 2258 01/28/13 0630  HGB 7.5*  --   --   < >  --   --   < >  --   < >  --   < > 6.8*  < > 7.3*  < > 8.7*  < > 8.9* 8.5* 8.4* 8.4*  WBC 9.0  --   --   --   --   --   --   --   --   --   < > 9.1  < > 12.9*  < > 16.0*  < > 13.4* 11.3* 10.6* 10.0  PLT 183  --   --   --   --   --   --   --   --   --   < > 57*  < > 67*  < > 73*  < > 107* 110* 113* 118*  NA 140  --  146*  --   --   --   --   --   --   --   < > 144  --  145  --   --   --   --   --   --  140  K 4.1  --  4.0  --   --   --   --   --   --   --   < >  3.5  --  3.8  --   --   --   --   --   --  2.9*  CL 100  --  112  --   --   --   --   --   --   --   < > 118*  --  122*  --   --   --   --   --   --  110  CO2 34*  --  26  --   --   --   --   --   --   --   < > 23  --  17*  --   --   --   --   --   --  23  GLUCOSE 124*  --  108*  --   --   --   --   --   --   --   < > 111*  --  137*  --   --   --   --   --   --  194*  BUN 61*  --  69*  --   --   --   --   --   --   --   < > 38*  --  39*  --   --   --   --   --   --  18  CREATININE 1.20  --  1.05  --   --   --   --   --   --   --   < > 0.80  --  1.09  --   --   --   --   --   --  0.96  CALCIUM 8.8  --  6.9*  --   --   --   --   --   --   --   < > 6.9*  --  6.7*  --   --   --   --   --   --  7.0*  MG  --   --  1.6  --   --   --   --   --   --   --   --   --   --   --   --   --   --   --   --   --   --   PHOS  --   --  3.3  --   --   --   --   --   --   --   --   --   --   --   --   --   --   --   --   --   --   AST  --  14 12  --   --   --   --   --   --   --   --   --   --   --   --   --   --   --   --   --   --   ALT  --  11 9  --   --   --   --   --   --   --   --   --   --   --   --   --   --   --   --   --   --  ALKPHOS  --  40 28*  --   --   --   --   --   --   --   --   --   --   --   --   --   --   --   --   --   --   BILITOT  --  0.1* 0.3  --   --   --   --   --   --   --   --   --   --   --   --   --   --   --   --   --   --   PROT  --  5.2* 3.7*  --    --   --   --   --   --   --   --   --   --   --   --   --   --   --   --   --   --   ALBUMIN  --  2.9* 2.1*  --   --   --   --   --   --   --   --   --   --   --   --   --   --   --   --   --   --   APTT  --   --   --   --   --   --   --   --   --   --   --   --   --   --   --  33  --  31  --   --   --   INR  --  1.02  --   --   --   --   --   --   --   --   --   --   --   --   --  1.77*  --  1.64*  --   --   --   TROPONINI  --   --   --   --  0.62* 0.99*  --  1.10*  --   --   --   --   --   --   --   --   --   --   --   --   --   PROBNP 741.6*  --   --   --   --   --   --   --   --   --   --   --   --   --   --   --   --   --   --   --   --   PHART  --   --   --   --   --   --   --   --   --  7.342*  --   --   --   --   --   --   --   --   --   --   --   PCO2ART  --   --   --   --   --   --   --   --   --  39.8  --   --   --   --   --   --   --   --   --   --   --  PO2ART  --   --   --   --   --   --   --   --   --  186.0*  --   --   --   --   --   --   --   --   --   --   --   < > = values in this interval not displayed.  Recent Labs Lab 01/26/13 1654 01/26/13 1929 01/26/13 2353 01/27/13 0711 01/27/13 1512  GLUCAP 93 102* 94 90 103*    CXR: NNF    ASSESSMENT / PLAN:  GASTROINTESTINAL A: Upper GI bleed 2nd to mallory weiss tear. Neg tagged RBC study 6/23 P:   -Cont PPI > change to intermittent dosing 6/24. Change to po 6/25 -Correct coagulopathies as needed -GI following  CARDIOVASCULAR A: Hemorrhagic shock, resolved NSTEMI 2nd to hemorrhagic shock. Hx of CAD,HTN, hyperlipidemia. Runs of VT in setting of hypokalemia P:  -no ASA or heparin due to bleeding -correct electrolytes -if VT persists after K+ corrected, consider amiodarone  HEMATOLOGIC A: Acute blood loss anemia - no overt bleeding presently Thrombocytopenia, improved P:  -change CBCs to q 12 hrs -transfuse for Hgb < 8.0 in setting of recent ACS  NEUROLOGIC A:  Acute encephalopathy, resolved Hx of  dementia. Physical deconditioning P:   -Monitor and treat conservatively PT consult 6/25  RHEUMATOLOGY A: Hx of rheumatoid arthritis. P: -Cont prn fentanyl for pain  PULMONARY A: Acute respiratory failure, resolved P:   -f/u CXR intermittently  RENAL A:  Acute renal failure due to hypotension, resolved P:   -monitor UOP, BMET   INFECTIOUS A:  No acute issues P:   -micro and abx as above  ENDOCRINE A:  No acute issues P:   -monitor blood sugar on BMET   NCB in event of cardiopulm arrest  Transfer to SDU 6/25.   Billy Fischer, MD ; [facility] [phone].  After 5:30 PM or weekends, call [phone]

## 2013-01-28 NOTE — Progress Notes (Signed)
Pt weaned to RA satting 98%.  DC'd Foley catheter per protocol.

## 2013-01-28 NOTE — Progress Notes (Signed)
The patient is doing well and there are no further signs of active gastrointestinal bleeding. His hemoglobin and hematocrit remain stable. He is going to be transferred out of the ICU today.  We will sign off at this point. Call us again if needed

## 2013-01-28 NOTE — Progress Notes (Signed)
NUTRITION FOLLOW UP  Intervention:    Diet advancement as tolerated to regular diet.   Add Ensure Complete PO BID to maximize oral intake of protein while on liquid diet, each supplement provides 350 kcal and 13 grams of protein.  Nutrition Dx:   Inadequate oral intake related to altered GI function and slow diet advancement as evidenced by full liquid diet.  New Goal:   Intake to meet >90% of estimated nutrition needs. Unmet.  Monitor:   Diet advancement, PO intake, labs, weight trend.   Assessment:   Patient was extubated on 6/22. Diet being slowly advanced. Just advanced to full liquids this morning. GI service is following for upper GI bleed due to mallory-Weiss tear. GI bleed scan was negative. Per discussion with RN, patient is consuming 100% of meals (clear liquids). Patient c/o being hungry. Will add PO supplement to increase protein intake.  Height: Ht Readings from Last 1 Encounters:  01/23/13 5\' 3"  (1.6 m)    Weight Status:   Wt Readings from Last 1 Encounters:  01/23/13 132 lb 4.4 oz (60 kg)   No new weight available.  Body mass index is 23.44 kg/(m^2).   Re-estimated needs:  Kcal: 1500-1700 Protein: 72-84 gm Fluid: 1.5-1.7 L  Skin: no issues  Diet Order: Full Liquid   Intake/Output Summary (Last 24 hours) at 01/28/13 1024 Last data filed at 01/28/13 0900  Gross per 24 hour  Intake   2150 ml  Output   1620 ml  Net    530 ml    Last BM: 6/24   Labs:   Recent Labs Lab 01/22/13 2220 01/23/13 0630  01/25/13 0430 01/26/13 0452 01/28/13 0630  NA 140 146*  < > 144 145 140  K 4.1 4.0  < > 3.5 3.8 2.9*  CL 100 112  < > 118* 122* 110  CO2 34* 26  < > 23 17* 23  BUN 61* 69*  < > 38* 39* 18  CREATININE 1.20 1.05  < > 0.80 1.09 0.96  CALCIUM 8.8 6.9*  < > 6.9* 6.7* 7.0*  MG  --  1.6  --   --   --   --   PHOS  --  3.3  --   --   --   --   GLUCOSE 124* 108*  < > 111* 137* 194*  < > = values in this interval not displayed.  CBG (last 3)    Recent Labs  01/26/13 2353 01/27/13 0711 01/27/13 1512  GLUCAP 94 90 103*    Scheduled Meds: . antiseptic oral rinse  15 mL Mouth Rinse q12n4p  . chlorhexidine  15 mL Mouth Rinse BID  . pantoprazole (PROTONIX) IV  40 mg Intravenous Q12H  . sodium chloride  3 mL Intravenous Q12H    Continuous Infusions: None   Joaquin Courts, RD, LDN, CNSC Pager [phone] After Hours Pager [phone]

## 2013-01-28 NOTE — Progress Notes (Signed)
Pt voiding via condom catheter without difficulty.  Has voided since catheter dc'd.

## 2013-01-28 NOTE — Progress Notes (Signed)
8:16 PM 01/28/2013  Patient to be transferred to stepdown. MD made aware of  Right Cordis catheter with CVC. BUE swollen and weeping. Unable to visualize site for new PIV placement. KVO saline to cordis. Dressing changed, timed and documented. IV team made aware.   Carlyon Prows RN

## 2013-01-28 NOTE — Progress Notes (Addendum)
Pt had a 25 beat run of vtach.  Made Dr. Bard Herbert aware.  Dr. Bard Herbert increasing replacement potassium at this time.

## 2013-01-29 DIAGNOSIS — K922 Gastrointestinal hemorrhage, unspecified: Secondary | ICD-10-CM

## 2013-01-29 LAB — BASIC METABOLIC PANEL
CO2: 23 mEq/L (ref 19–32)
Chloride: 114 mEq/L — ABNORMAL HIGH (ref 96–112)
Creatinine, Ser: 0.87 mg/dL (ref 0.50–1.35)
GFR calc Af Amer: >90 mL/min (ref >90)
Potassium: 3.4 mEq/L — ABNORMAL LOW (ref 3.5–5.1)
Sodium: 143 mEq/L (ref 135–145)

## 2013-01-29 LAB — CBC
HCT: 26 % — ABNORMAL LOW (ref 39.0–52.0)
RBC: 2.95 MIL/uL — ABNORMAL LOW (ref 4.22–5.81)
RDW: 16.8 % — ABNORMAL HIGH (ref 11.5–15.5)
WBC: 9.8 10*3/uL (ref 4.0–10.5)

## 2013-01-29 NOTE — Progress Notes (Signed)
Physical Therapy Evaluation Patient Details Name: SUNDIATA FERRICK MRN: [id number] DOB: 09-May-1933 Today's Date: 01/29/2013 Time: 4098-1191 PT Time Calculation (min): 34 min  PT Assessment / Plan / Recommendation History of Present Illness    79 y/o male with dementia who is DNR was admitted on 6/20 through the [facility] ED with a GI bleed and confusion. He developed combativeness and worsening hypotension so PCCM was consulted. Recent NSTEMI as well as encephalopathy.      Clinical Impression  Pt with functional deficits in areas of endurance, balance and poor coordination of LEs.  Will benefit from PT to address these issues.  Should be able to progress and go home with daughter with HHPT f/u.      PT Assessment  Patient needs continued PT services    Follow Up Recommendations  Home health PT;Supervision/Assistance - 24 hour                Equipment Recommendations  None recommended by PT         Frequency Min 3X/week    Precautions / Restrictions Precautions Precautions: Fall Restrictions Weight Bearing Restrictions: No   Pertinent Vitals/Pain VSS, no pain      Mobility  Bed Mobility Bed Mobility: Rolling Left;Left Sidelying to Sit;Sitting - Scoot to [place] of Bed Rolling Left: 4: Min assist;With rail Left Sidelying to Sit: 3: Mod assist;With rails;HOB elevated Sitting - Scoot to Edge of Bed: 3: Mod assist Details for Bed Mobility Assistance: cues for sequencing and technique Transfers Transfers: Sit to Stand;Stand to Sit;Stand Pivot Transfers Sit to Stand: 3: Mod assist;With upper extremity assist;From bed Stand to Sit: 3: Mod assist;With armrests;To chair/3-in-1;With upper extremity assist Stand Pivot Transfers: 3: Mod assist Details for Transfer Assistance: Pt needed bil HHA to pivot around to the recliner from the bed.  Slightly unsteady on his feet needing min to mod assist.   Ambulation/Gait Ambulation/Gait Assistance: Not tested (comment) Stairs: No Wheelchair  Mobility Wheelchair Mobility: No    Exercises General Exercises - Lower Extremity Ankle Circles/Pumps: AROM;15 reps;Seated Long Arc Quad: AROM;Both;10 reps;Seated   PT Diagnosis: Generalized weakness  PT Problem List: Decreased strength;Decreased activity tolerance;Decreased balance;Decreased mobility;Decreased safety awareness;Decreased knowledge of use of DME PT Treatment Interventions: DME instruction;Gait training;Functional mobility training;Therapeutic activities;Therapeutic exercise;Balance training;Neuromuscular re-education;Patient/family education     PT Goals(Current goals can be found in the care plan section) Acute Rehab PT Goals Patient Stated Goal: to go home PT Goal Formulation: With patient Time For Goal Achievement: 02/12/13 Potential to Achieve Goals: Good  Visit Information  Last PT Received On: 01/29/13 Assistance Needed: +2       Prior Functioning  Home Living Family/patient expects to be discharged to:: Private residence Living Arrangements: Children Available Help at Discharge: Family;Available 24 hours/day Type of Home: House Home Access: Stairs to enter [employer] of Steps: 1 Entrance Stairs-Rails: None Home Layout: Two level;Full bath on main level;Able to live on main level with bedroom/bathroom Home Equipment: Gilmer Mor - single point;Insurance underwriter - 4 wheels;Bedside commode;Shower seat;Grab bars - tub/shower;Wheelchair - manual Prior Function Level of Independence: Independent with assistive device(s);Needs assistance (used cane) Gait / Transfers Assistance Needed: Amb with modif I with cane ADL's / Homemaking Assistance Needed: Bathing and dressing needed mod assist, grooming mod assist, cooking total assist.  Communication Communication: No difficulties Dominant Hand: Right    Cognition  Cognition Arousal/Alertness: Awake/alert Behavior During Therapy: WFL for tasks assessed/performed Overall Cognitive Status: Within  Functional Limits for tasks assessed    Extremity/Trunk Assessment  Lower Extremity Assessment Lower Extremity Assessment: Generalized weakness Cervical / Trunk Assessment Cervical / Trunk Assessment: Kyphotic   Balance Static Standing Balance Static Standing - Balance Support: Bilateral upper extremity supported;During functional activity Static Standing - Level of Assistance: 3: Mod assist Static Standing - Comment/# of Minutes: 2 minutes needing assist for upright stance.  Pt leans posteriorly.   End of Session PT - End of Session Equipment Utilized During Treatment: Gait belt Activity Tolerance: Patient limited by fatigue Patient left: in chair;with call bell/phone within reach Nurse Communication: Mobility status       INGOLD,Bobbye Reinitz 01/29/2013, 3:02 PM [facility] [phone] (pager)

## 2013-01-29 NOTE — Progress Notes (Signed)
PULMONARY  / CRITICAL CARE MEDICINE  Name: [name] MRN: [id number] DOB: [birth year]    ADMISSION DATE:  01/22/2013 CONSULTATION DATE:  01/22/2013  REFERRING MD :  Adela Glimpse PRIMARY SERVICE: PCCM  CHIEF COMPLAINT:  GI bleed, confusion  BRIEF PATIENT DESCRIPTION: 79 y/o male with dementia who is DNR was admitted on 6/20 through the [facility] ED with a GI bleed and confusion.  He developed combativeness and worsening hypotension so PCCM was consulted.  SIGNIFICANT EVENTS: 6/20 Transfer to ICU, EGD >> rebleed in PM >> VDRF, repeat EGD 6/23 2 units PRBCs for hemorrhagic shock, Hgb 6.3 and recent NSTEMI. One unit mixed plts for plt 71K   STUDIES:  6/20 EGD >> mallory weiss  6/23 Tagged RBC scan: negative 6/25. No overt bleeding. H/H stable X 24 hrs. Transfer to SDU ordered 6/25 25 beat run of VT. Hypokalemia corrected 6/26 Transferred to SDU. PT eval and Rx. Advance diet  ANTIBIOTICS 6/20 Rocephin >> 6/23  LINES / TUBES: 6/20 ETT >> 6/22 6/20 R IJ CVL >>   SUBJECTIVE:  No distress. No new complaints. No further rectal bleeding  VITAL SIGNS: Temp:  [97.5 F (36.4 C)-98.1 F (36.7 C)] 98.1 F (36.7 C) (06/26 0744) Pulse Rate:  [48-80] 49 (06/26 0600) Resp:  [20-28] 22 (06/26 0600) BP: (105-164)/(47-72) 141/47 mmHg (06/26 0600) SpO2:  [95 %-100 %] 95 % (06/26 0600) VENT SETTINGS:   INTAKE / OUTPUT: Intake/Output     06/25 0701 - 06/26 0700 06/26 0701 - 06/27 0700   P.O. 1920    I.V. (mL/kg) 340 (5.7)    IV Piggyback 200    Total Intake(mL/kg) 2460 (41)    Urine (mL/kg/hr) 2830 (2)    Stool     Total Output 2830     Net -370          Stool Occurrence 4 x      PHYSICAL EXAMINATION:  Gen: weak and frail, NAD HEENT: WNL PULM: no wheeze CV: regular AB: soft, non tender Ext: 1-2+ BUE edema Neuro: MAEs, + F/C, cognition intact   LABS:  Recent Labs Lab 01/22/13 2220 01/22/13 2356 01/23/13 0630  01/23/13 1130 01/23/13 1214  01/23/13 1814  01/23/13 2212   01/26/13 0452  01/26/13 1240  01/27/13 0600  01/28/13 0630 01/28/13 1900 01/29/13 0410  HGB 7.5*  --   --   < >  --   --   < >  --   < >  --   < > 7.3*  < > 8.7*  < > 8.9*  < > 8.4* 9.4* 9.0*  WBC 9.0  --   --   --   --   --   --   --   --   --   < > 12.9*  < > 16.0*  < > 13.4*  < > 10.0 10.6* 9.8  PLT 183  --   --   --   --   --   --   --   --   --   < > 67*  < > 73*  < > 107*  < > 118* 144* 135*  NA 140  --  146*  --   --   --   --   --   --   --   < > 145  --   --   --   --   --  140  --  143  K 4.1  --  4.0  --   --   --   --   --   --   --   < >  3.8  --   --   --   --   --  2.9*  --  3.4*  CL 100  --  112  --   --   --   --   --   --   --   < > 122*  --   --   --   --   --  110  --  114*  CO2 34*  --  26  --   --   --   --   --   --   --   < > 17*  --   --   --   --   --  23  --  23  GLUCOSE 124*  --  108*  --   --   --   --   --   --   --   < > 137*  --   --   --   --   --  194*  --  91  BUN 61*  --  69*  --   --   --   --   --   --   --   < > 39*  --   --   --   --   --  18  --  12  CREATININE 1.20  --  1.05  --   --   --   --   --   --   --   < > 1.09  --   --   --   --   --  0.96  --  0.87  CALCIUM 8.8  --  6.9*  --   --   --   --   --   --   --   < > 6.7*  --   --   --   --   --  7.0*  --  7.1*  MG  --   --  1.6  --   --   --   --   --   --   --   --   --   --   --   --   --   --   --   --   --   PHOS  --   --  3.3  --   --   --   --   --   --   --   --   --   --   --   --   --   --   --   --   --   AST  --  14 12  --   --   --   --   --   --   --   --   --   --   --   --   --   --   --   --   --   ALT  --  11 9  --   --   --   --   --   --   --   --   --   --   --   --   --   --   --   --   --   ALKPHOS  --  40 28*  --   --   --   --   --   --   --   --   --   --   --   --   --   --   --   --   --  BILITOT  --  0.1* 0.3  --   --   --   --   --   --   --   --   --   --   --   --   --   --   --   --   --   PROT  --  5.2* 3.7*  --   --   --   --   --   --   --   --   --   --   --    --   --   --   --   --   --   ALBUMIN  --  2.9* 2.1*  --   --   --   --   --   --   --   --   --   --   --   --   --   --   --   --   --   APTT  --   --   --   --   --   --   --   --   --   --   --   --   --  33  --  31  --   --   --   --   INR  --  1.02  --   --   --   --   --   --   --   --   --   --   --  1.77*  --  1.64*  --   --   --   --   TROPONINI  --   --   --   --  0.62* 0.99*  --  1.10*  --   --   --   --   --   --   --   --   --   --   --   --   PROBNP 741.6*  --   --   --   --   --   --   --   --   --   --   --   --   --   --   --   --   --   --   --   PHART  --   --   --   --   --   --   --   --   --  7.342*  --   --   --   --   --   --   --   --   --   --   PCO2ART  --   --   --   --   --   --   --   --   --  39.8  --   --   --   --   --   --   --   --   --   --   PO2ART  --   --   --   --   --   --   --   --   --  186.0*  --   --   --   --   --   --   --   --   --   --   < > = values in this interval not displayed.  Recent Labs Lab 01/26/13 1654  01/26/13 1929 01/26/13 2353 01/27/13 0711 01/27/13 1512  GLUCAP 93 102* 94 90 103*    CXR: NNF    ASSESSMENT / PLAN:  GASTROINTESTINAL A: Upper GI bleed 2nd to mallory weiss tear. Neg tagged RBC study 6/23 P:   -Cont PPI  -Correct coagulopathies as needed -GI following -Advance diet  CARDIOVASCULAR A: Hemorrhagic shock, resolved NSTEMI 2nd to hemorrhagic shock. Hx of CAD,HTN, hyperlipidemia. Runs of VT in setting of hypokalemia P:  -no ASA or heparin due to bleeding -correct electrolytes -if VT recurs after K+ corrected, consider Cards and/or amiodarone  HEMATOLOGIC A: Acute blood loss anemia - no overt bleeding presently Thrombocytopenia, improving P:  -change CBCs to q D -transfuse for Hgb < 8.0 in setting of recent ACS  NEUROLOGIC A:  Acute encephalopathy, resolved Hx of dementia. Physical deconditioning P:   -Monitor and treat conservatively PT consult 6/25, requested again 6/26 Might need  CIR  RHEUMATOLOGY A: Hx of rheumatoid arthritis. P: -Cont prn analgesics  PULMONARY A: Acute respiratory failure, resolved P:   -f/u CXR intermittently  RENAL A:  Acute renal failure due to hypotension, resolved P:   -monitor UOP, BMET   INFECTIOUS A:  No acute issues P:   -micro and abx as above  ENDOCRINE A:  No acute issues P:   -monitor blood sugar on BMET    Poor venous access > leave CVL for now NCB in event of cardiopulm arrest  Transfer to SDU 6/26.   Billy Fischer, MD ; [facility] [phone].  After 5:30 PM or weekends, call [phone]

## 2013-01-29 NOTE — Progress Notes (Signed)
Thank you to Dr Jacky Kindle for this referral via the long length of stay committee meeting.  Chart review complete.  Patient is not eligible for [facility] Care Management services because his PCP is not a [facility] primary care provider or is not [facility] affiliated.  Lucia Gaskins RNCM notified.  For any additional questions or new referrals please contact Anibal Henderson BSN RN [facility] Liaison at [phone]

## 2013-01-30 DIAGNOSIS — M069 Rheumatoid arthritis, unspecified: Secondary | ICD-10-CM

## 2013-01-30 LAB — CBC
HCT: 27.7 % — ABNORMAL LOW (ref 39.0–52.0)
Hemoglobin: 9.4 g/dL — ABNORMAL LOW (ref 13.0–17.0)
MCH: 30.5 pg (ref 26.0–34.0)
MCV: 89.9 fL (ref 78.0–100.0)
RBC: 3.08 MIL/uL — ABNORMAL LOW (ref 4.22–5.81)
RDW: 17.2 % — ABNORMAL HIGH (ref 11.5–15.5)

## 2013-01-30 LAB — BASIC METABOLIC PANEL
BUN: 10 mg/dL (ref 6–23)
CO2: 27 mEq/L (ref 19–32)
Glucose, Bld: 96 mg/dL (ref 70–99)
Potassium: 4 mEq/L (ref 3.5–5.1)
Sodium: 141 mEq/L (ref 135–145)

## 2013-01-30 NOTE — Progress Notes (Signed)
[name] is a 79 y.o. male patient who transferred  From 2600 awake, alert  & orientated  X 3, DNR, VSS - Blood pressure 164/59, pulse 59, temperature 98.5 F (36.9 C), temperature source Oral, resp. rate 7, height 5\' 5"  (1.651 m), weight 65.3 kg (143 lb 15.4 oz), SpO2 95.00%., no c/o shortness of breath, no c/o chest pain, no distress noted. Tele #[id number] placed and pt is currently running:sinus bradycardia.   IV site WDL: internal jugular right, condition patent and no redness with a transparent dsg that's clean dry and intact.  Allergies:  No Known Allergies   Past Medical History  Diagnosis Date  . Coronary atherosclerosis of native coronary artery     Multivessel  . Essential hypertension, benign   . Mixed hyperlipidemia   . Erectile dysfunction   . Osteoarthritis   . Myocardial infarction     IMI 1987 and PLMI 1997  . Rheumatoid arthritis, adult     Pt orientation to unit, room and routine. SR up x 2, fall risk assessment complete with Patient and family verbalizing understanding of risks associated with falls. Pt verbalizes an understanding of how to use the call bell and to call for help before getting out of bed. Skin has weeping on bilateral UE, with kerlex and foam .   Will cont to monitor and assist as needed.  Cindra Eves, RN 01/30/2013 7:32 PM

## 2013-01-30 NOTE — Progress Notes (Signed)
PULMONARY  / CRITICAL CARE MEDICINE  Name: [name] MRN: [id number] DOB: [birth year]    ADMISSION DATE:  01/22/2013 CONSULTATION DATE:  01/22/2013  REFERRING MD :  Adela Glimpse PRIMARY SERVICE: PCCM  CHIEF COMPLAINT:  GI bleed, confusion  BRIEF PATIENT DESCRIPTION: 79 y/o male with dementia who is DNR was admitted on 6/20 through the [facility] ED with a GI bleed and confusion.  He developed combativeness and worsening hypotension so PCCM was consulted.  SIGNIFICANT EVENTS: 6/20 Transfer to ICU, EGD >> rebleed in PM >> VDRF, repeat EGD 6/23 2 units PRBCs for hemorrhagic shock, Hgb 6.3 and recent NSTEMI. One unit mixed plts for plt 71K  STUDIES:  6/20 EGD >> mallory weiss  6/23 Tagged RBC scan: negative 6/25. No overt bleeding. H/H stable X 24 hrs. Transfer to SDU ordered 6/25 25 beat run of VT. Hypokalemia corrected 6/26 Transferred to SDU. PT eval and Rx. Advance diet  ANTIBIOTICS 6/20 Rocephin >> 6/23  LINES / TUBES: 6/20 ETT >> 6/22 6/20 R IJ CVL >>   SUBJECTIVE:  Up in chair and comfortable, tolerating PO, no further bleeding  VITAL SIGNS: Temp:  [97.5 F (36.4 C)-98.3 F (36.8 C)] 97.9 F (36.6 C) (06/27 0739) Pulse Rate:  [52-76] 56 (06/27 0739) Resp:  [18-22] 18 (06/27 0739) BP: (125-151)/(51-64) 151/60 mmHg (06/27 0739) SpO2:  [93 %-99 %] 97 % (06/27 0739) VENT SETTINGS:   INTAKE / OUTPUT: Intake/Output     06/26 0701 - 06/27 0700 06/27 0701 - 06/28 0700   P.O. 100 240   I.V. (mL/kg) 280 (4.3) 80 (1.2)   IV Piggyback     Total Intake(mL/kg) 380 (5.8) 320 (4.9)   Urine (mL/kg/hr) 750 (0.5) 1900 (7)   Total Output 750 1900   Net -370 -1580        Stool Occurrence 2 x 1 x     PHYSICAL EXAMINATION:  Gen: weak and frail, NAD HEENT: WNL PULM: no wheeze CV: regular AB: soft, non tender Ext: 1-2+ BUE edema Neuro: MAEs, + F/C, cognition intact  LABS:  Recent Labs Lab 01/23/13 1130 01/23/13 1214  01/23/13 1814  01/23/13 2212  01/26/13 1240   01/27/13 0600  01/28/13 0630 01/28/13 1900 01/29/13 0410 01/30/13 0445  HGB  --   --   < >  --   < >  --   < > 8.7*  < > 8.9*  < > 8.4* 9.4* 9.0* 9.4*  WBC  --   --   --   --   --   --   < > 16.0*  < > 13.4*  < > 10.0 10.6* 9.8 8.7  PLT  --   --   --   --   --   --   < > 73*  < > 107*  < > 118* 144* 135* 184  NA  --   --   --   --   --   --   < >  --   --   --   --  140  --  143 141  K  --   --   --   --   --   --   < >  --   --   --   --  2.9*  --  3.4* 4.0  CL  --   --   --   --   --   --   < >  --   --   --   --  110  --  114* 109  CO2  --   --   --   --   --   --   < >  --   --   --   --  23  --  23 27  GLUCOSE  --   --   --   --   --   --   < >  --   --   --   --  194*  --  91 96  BUN  --   --   --   --   --   --   < >  --   --   --   --  18  --  12 10  CREATININE  --   --   --   --   --   --   < >  --   --   --   --  0.96  --  0.87 0.93  CALCIUM  --   --   --   --   --   --   < >  --   --   --   --  7.0*  --  7.1* 6.6*  APTT  --   --   --   --   --   --   --  33  --  31  --   --   --   --   --   INR  --   --   --   --   --   --   --  1.77*  --  1.64*  --   --   --   --   --   TROPONINI 0.62* 0.99*  --  1.10*  --   --   --   --   --   --   --   --   --   --   --   PHART  --   --   --   --   --  7.342*  --   --   --   --   --   --   --   --   --   PCO2ART  --   --   --   --   --  39.8  --   --   --   --   --   --   --   --   --   PO2ART  --   --   --   --   --  186.0*  --   --   --   --   --   --   --   --   --   < > = values in this interval not displayed.  Recent Labs Lab 01/26/13 1654 01/26/13 1929 01/26/13 2353 01/27/13 0711 01/27/13 1512  GLUCAP 93 102* 94 90 103*   CXR: NNF  ASSESSMENT / PLAN:  GASTROINTESTINAL A: Upper GI bleed 2nd to mallory weiss tear. Neg tagged RBC study 6/23 P:   -Cont PPI  -Correct coagulopathies as needed -GI following -Advance diet  CARDIOVASCULAR A: Hemorrhagic shock, resolved NSTEMI 2nd to hemorrhagic shock. Hx of CAD,HTN,  hyperlipidemia. Runs of VT in setting of hypokalemia P:  -no ASA or heparin due to bleeding -correct electrolytes -if VT recurs after K+ corrected, consider Cards and/or amiodarone -home lasix, avapro, plendil, atenolol all on hold; restart as he is able to  tolerate  HEMATOLOGIC A: Acute blood loss anemia - no overt bleeding presently Thrombocytopenia, improving P:  -CBCs to q D -transfuse for Hgb < 8.0 in setting of recent ACS  NEUROLOGIC A:  Acute encephalopathy, resolved Hx of dementia. Physical deconditioning P:   -Monitor and treat conservatively; home Risperdal, celexa on hold -PT consult 6/25, requested again 6/26 -Might need CIR depending on PT opinion  RHEUMATOLOGY A: Hx of rheumatoid arthritis. P: -Cont prn analgesics  PULMONARY A: Acute respiratory failure, resolved P:   -f/u CXR intermittently  RENAL A:  Acute renal failure due to hypotension, resolved P:   -monitor UOP, BMET  INFECTIOUS A:  No acute issues P:   -micro and abx as above  ENDOCRINE A:  No acute issues P:   -monitor blood sugar on BMET  NCB in event of cardiopulm arrest  Transfer to Floor 6/27, to [facility] service as of 6/28  Billy Fischer, MD ; [facility] service Mobile [phone].  After 5:30 PM or weekends, call [phone]

## 2013-01-30 NOTE — Progress Notes (Signed)
Pt awake and alert X 3 throughout shift. Tolerated OOB in chair for 4 hours. Pain in right hip controlled with oxy 5 every 4 hours. Will continue to provide a safe environment .

## 2013-01-30 NOTE — Progress Notes (Signed)
Physical Therapy Treatment Patient Details Name: [name] MRN: [id number] DOB: 06/07/1933 Today's Date: 01/30/2013 Time: 4098-1191 PT Time Calculation (min): 23 min  PT Assessment / Plan / Recommendation  PT Comments   Pt making progress with PT goals + mobility as evident in ability to ambulate ~20' today.  Pt unsteady & weak with gait.  He would benefit from use of RW next session to increase stability & ensure safety.      Follow Up Recommendations  Home health PT;Supervision/Assistance - 24 hour     Does the patient have the potential to tolerate intense rehabilitation     Barriers to Discharge        Equipment Recommendations  None recommended by PT    Recommendations for Other Services    Frequency Min 3X/week   Progress towards PT Goals Progress towards PT goals: Progressing toward goals  Plan Current plan remains appropriate    Precautions / Restrictions Precautions Precautions: Fall Restrictions Weight Bearing Restrictions: No       Mobility  Bed Mobility Bed Mobility: Supine to Sit;Sitting - Scoot to Edge of Bed;Sit to Supine;Scooting to [facility] Supine to Sit: 5: Supervision;HOB elevated;With rails Sitting - Scoot to Edge of Bed: 5: Supervision Sit to Supine: 5: Supervision Scooting to [facility]: 1: +2 Total assist Details for Bed Mobility Assistance: use of draw pad to scoot pt to [facility] in supine.   Transfers Transfers: Sit to Stand;Stand to Sit Sit to Stand: 4: Min assist;With upper extremity assist;From bed Stand to Sit: 4: Min assist;With upper extremity assist;To bed Details for Transfer Assistance: cues for hand placement & technique.  (A) to achieve standing, balance, & controlled descent.   Ambulation/Gait Ambulation/Gait Assistance: 3: Mod assist Ambulation Distance (Feet): 20 Feet Assistive device: 1 person hand held assist Ambulation/Gait Assistance Details: (A) for balance & safety.  Pt with small shuffling steps.  Pt states he typically ambulates with  a walking cane but he would be agreeable to use a RW to increase stability & safety.   Gait Pattern: Decreased step length - right;Decreased step length - left;Decreased stride length;Shuffle;Narrow base of support Stairs: No Wheelchair Mobility Wheelchair Mobility: No    Exercises General Exercises - Lower Extremity Ankle Circles/Pumps: AROM;Both;10 reps;Supine Long Arc Quad: AROM;Both;10 reps;Seated Hip ABduction/ADduction: AAROM;Strengthening;Both;10 reps;Supine Straight Leg Raises: AROM;Strengthening;Both;10 reps;Supine Hip Flexion/Marching: AROM;Strengthening;Both;10 reps;Seated   PT Diagnosis:    PT Problem List:   PT Treatment Interventions:     PT Goals (current goals can now be found in the care plan section)    Visit Information  Last PT Received On: 01/30/13 Assistance Needed: +2 (safety)    Subjective Data      Cognition  Cognition Arousal/Alertness: Awake/alert Behavior During Therapy: WFL for tasks assessed/performed Overall Cognitive Status: Within Functional Limits for tasks assessed    Balance     End of Session PT - End of Session Equipment Utilized During Treatment: Gait belt Activity Tolerance: Patient tolerated treatment well Patient left: in bed;with call bell/phone within reach Nurse Communication: Mobility status   GP     Lara Mulch 01/30/2013, 1:42 PM    Verdell Face, PTA [phone] 01/30/2013

## 2013-01-31 LAB — BASIC METABOLIC PANEL
BUN: 8 mg/dL (ref 6–23)
CO2: 27 mEq/L (ref 19–32)
Calcium: 7.9 mg/dL — ABNORMAL LOW (ref 8.4–10.5)
Creatinine, Ser: 0.98 mg/dL (ref 0.50–1.35)
Glucose, Bld: 101 mg/dL — ABNORMAL HIGH (ref 70–99)

## 2013-01-31 LAB — CBC
Hemoglobin: 9.5 g/dL — ABNORMAL LOW (ref 13.0–17.0)
MCH: 29.4 pg (ref 26.0–34.0)
MCHC: 32.3 g/dL (ref 30.0–36.0)
MCV: 91 fL (ref 78.0–100.0)
RBC: 3.23 MIL/uL — ABNORMAL LOW (ref 4.22–5.81)

## 2013-01-31 MED ORDER — DIPHENHYDRAMINE HCL 50 MG/ML IJ SOLN
12.5000 mg | Freq: Four times a day (QID) | INTRAMUSCULAR | Status: DC | PRN
  Administered 2013-01-31 – 2013-02-01 (×2): 12.5 mg via INTRAVENOUS
  Filled 2013-01-31 (×3): qty 1

## 2013-01-31 NOTE — Progress Notes (Signed)
[facility] PROGRESS NOTE  [name]:[id number] DOB: 1933/07/07 DOA: 01/22/2013 PCP: Samuel Jester, DO  HPI/Subjective: Denies any pain, denies any recent bleeding.   Assessment/Plan:  GI bleed -Secondary to Mallory-Weiss tear, status post EGD. -Seems to be no bleeding so far, monitor closely. -Continue Protonix per GI recommendation.  Anemia, acute blood loss -Status post transfusion of 14 units of packed RBCs. -Also has low platelets and status post 1 unit of pheresed platelets.  Non-STEMI -Troponin increased to 1.1, likely secondary to significant anemia and hemorrhagic shock. -On beta blockers, no heparin or aspirin because of concurrent bleeding.  Acute encephalopathy -Unclear etiology, could be secondary to the acute illness from hemorrhagic shock. but resolved.  Acute respiratory failure -Patient was intubated and mechanically ventilated because of combativeness and acute encephalopathy. -At that time he needed to have EGD done.   Code Status: Full code Family Communication: Plan explained to the patient Disposition Plan: Remain inpatient   Consultants:  Patient transferred from ICU to hospitalist team to assume care on 01/31/2013  Procedures:  EGD done by Dr. Bosie Clos  Antibiotics:  None    Objective: Filed Vitals:   01/30/13 1547 01/30/13 1816 01/30/13 2049 01/31/13 0424  BP: 150/67 164/59 149/66 136/57  Pulse: 59 59 66 55  Temp: 98.5 F (36.9 C) 98.5 F (36.9 C) 98.3 F (36.8 C) 98.4 F (36.9 C)  TempSrc: Oral Oral Oral Oral  Resp: 17 7 18 16   Height:      Weight:      SpO2: 100% 95% 96% 96%    Intake/Output Summary (Last 24 hours) at 01/31/13 1228 Last data filed at 01/31/13 1127  Gross per 24 hour  Intake    240 ml  Output   1375 ml  Net  -1135 ml   Filed Weights   01/23/13 0600 01/29/13 0959  Weight: 60 kg (132 lb 4.4 oz) 65.3 kg (143 lb 15.4 oz)    Exam:  General: Alert and awake, oriented x3, not in any  acute distress. HEENT: anicteric sclera, pupils reactive to light and accommodation, EOMI CVS: S1-S2 clear, no murmur rubs or gallops Chest: clear to auscultation bilaterally, no wheezing, rales or rhonchi Abdomen: soft nontender, nondistended, normal bowel sounds, no organomegaly Extremities: no cyanosis, clubbing or edema noted bilaterally Neuro: Cranial nerves II-XII intact, no focal neurological deficits   Data Reviewed: Basic Metabolic Panel:  Recent Labs Lab 01/26/13 0452 01/28/13 0630 01/29/13 0410 01/30/13 0445 01/31/13 0447  NA 145 140 143 141 143  K 3.8 2.9* 3.4* 4.0 3.8  CL 122* 110 114* 109 108  CO2 17* 23 23 27 27   GLUCOSE 137* 194* 91 96 101*  BUN 39* 18 12 10 8   CREATININE 1.09 0.96 0.87 0.93 0.98  CALCIUM 6.7* 7.0* 7.1* 6.6* 7.9*   Liver Function Tests: No results found for this basename: AST, ALT, ALKPHOS, BILITOT, PROT, ALBUMIN,  in the last 168 hours No results found for this basename: LIPASE, AMYLASE,  in the last 168 hours No results found for this basename: AMMONIA,  in the last 168 hours CBC:  Recent Labs Lab 01/28/13 0630 01/28/13 1900 01/29/13 0410 01/30/13 0445 01/31/13 0447  WBC 10.0 10.6* 9.8 8.7 8.6  HGB 8.4* 9.4* 9.0* 9.4* 9.5*  HCT 23.7* 27.5* 26.0* 27.7* 29.4*  MCV 86.8 87.6 88.1 89.9 91.0  PLT 118* 144* 135* 184 232   Cardiac Enzymes: No results found for this basename: CKTOTAL, CKMB, CKMBINDEX, TROPONINI,  in the last 168 hours BNP (  last 3 results)  Recent Labs  01/22/13 2220  PROBNP 741.6*   CBG:  Recent Labs Lab 01/26/13 1654 01/26/13 1929 01/26/13 2353 01/27/13 0711 01/27/13 1512  GLUCAP 93 102* 94 90 103*    Recent Results (from the past 240 hour(s))  MRSA PCR SCREENING     Status: None   Collection Time    01/23/13  2:22 AM      Result Value Range Status   MRSA by PCR NEGATIVE  NEGATIVE Final   Comment:            The GeneXpert MRSA Assay (FDA     approved for NASAL specimens     only), is one  component of a     comprehensive MRSA colonization     surveillance program. It is not     intended to diagnose MRSA     infection nor to guide or     monitor treatment for     MRSA infections.     Studies: No results found.  Scheduled Meds: . feeding supplement  237 mL Oral BID BM  . pantoprazole  40 mg Oral Q1200  . sodium chloride  3 mL Intravenous Q12H   Continuous Infusions: . sodium chloride 20 mL/hr (01/29/13 2039)    Active Problems:   Coronary atherosclerosis of native coronary artery   Essential hypertension, benign   Anemia   Upper GI bleed   Confusion   Hypotension   Hemorrhagic shock   Acute respiratory failure   NSTEMI (non-ST elevated myocardial infarction)   Hypokalemia   Physical deconditioning    Time spent: 35 minutes    [facility] A  [facility] Pager [phone]. If 7PM-7AM, please contact night-coverage at [email or web address], password [facility] 01/31/2013, 12:28 PM  LOS: 9 days

## 2013-01-31 NOTE — Progress Notes (Addendum)
Pt pulled out IJ. Danny from IV team came to assess and stated catheter was intact. Dressing placed on site. On call MD notified. Will continue to monitor.

## 2013-02-01 LAB — CBC
HCT: 29.1 % — ABNORMAL LOW (ref 39.0–52.0)
Hemoglobin: 9.4 g/dL — ABNORMAL LOW (ref 13.0–17.0)
MCH: 29.4 pg (ref 26.0–34.0)
MCHC: 32.3 g/dL (ref 30.0–36.0)
MCV: 90.9 fL (ref 78.0–100.0)
RDW: 16.2 % — ABNORMAL HIGH (ref 11.5–15.5)

## 2013-02-01 LAB — BASIC METABOLIC PANEL
BUN: 8 mg/dL (ref 6–23)
Calcium: 7.7 mg/dL — ABNORMAL LOW (ref 8.4–10.5)
Creatinine, Ser: 1.04 mg/dL (ref 0.50–1.35)
GFR calc non Af Amer: 67 mL/min — ABNORMAL LOW (ref >90)
Glucose, Bld: 92 mg/dL (ref 70–99)

## 2013-02-01 MED ORDER — ACETAMINOPHEN 325 MG PO TABS
650.0000 mg | ORAL_TABLET | ORAL | Status: DC | PRN
  Administered 2013-02-01 – 2013-02-02 (×2): 650 mg via ORAL
  Filled 2013-02-01 (×2): qty 2

## 2013-02-01 MED ORDER — POTASSIUM CHLORIDE CRYS ER 20 MEQ PO TBCR
60.0000 meq | EXTENDED_RELEASE_TABLET | Freq: Four times a day (QID) | ORAL | Status: AC
  Administered 2013-02-01 (×2): 60 meq via ORAL
  Filled 2013-02-01 (×2): qty 3

## 2013-02-01 NOTE — Progress Notes (Signed)
[facility] PROGRESS NOTE  [name] ZOX:[id number] DOB: 06/11/1933 DOA: 01/22/2013 PCP: Samuel Jester, DO  HPI/Subjective: No significant events overnight. Denies any complaints. Has some itching and macular rash on his back   Assessment/Plan:  GI bleed -Secondary to Mallory-Weiss tear, status post EGD. -Seems to be no bleeding so far, monitor closely. -Continue Protonix per GI recommendation.  Anemia, acute blood loss -Status post transfusion of 14 units of packed RBCs. -Also has low platelets and status post 1 unit of pheresed platelets.  Non-STEMI -Troponin increased to 1.1, likely secondary to significant anemia and hemorrhagic shock. -On beta blockers, no heparin or aspirin because of concurrent bleeding.  Acute encephalopathy -Unclear etiology, could be secondary to the acute illness from hemorrhagic shock. but resolved.  Acute respiratory failure -Patient was intubated and mechanically ventilated because of combativeness and acute encephalopathy. -At that time he needed to have EGD done.   Code Status: Full code Family Communication: Plan explained to the patient Disposition Plan: Remain inpatient   Consultants:  Patient transferred from ICU to hospitalist team to assume care on 01/31/2013  Procedures:  EGD done by Dr. Bosie Clos  Intubation and mechanical ventilation, intubation on 6/20 and extubation on 6/22  Antibiotics:  None    Objective: Filed Vitals:   01/31/13 0424 01/31/13 1320 01/31/13 2200 02/01/13 0511  BP: 136/57 136/65 163/58 146/57  Pulse: 55 63 52 57  Temp: 98.4 F (36.9 C) 98.3 F (36.8 C) 98.2 F (36.8 C) 98.2 F (36.8 C)  TempSrc: Oral Oral Oral Oral  Resp: 16 16 20 20   Height:      Weight:      SpO2: 96% 98% 97% 95%    Intake/Output Summary (Last 24 hours) at 02/01/13 1021 Last data filed at 02/01/13 0519  Gross per 24 hour  Intake      3 ml  Output   1475 ml  Net  -1472 ml   Filed Weights   01/23/13  0600 01/29/13 0959  Weight: 60 kg (132 lb 4.4 oz) 65.3 kg (143 lb 15.4 oz)    Exam:  General: Alert and awake, oriented x3, not in any acute distress. HEENT: anicteric sclera, pupils reactive to light and accommodation, EOMI CVS: S1-S2 clear, no murmur rubs or gallops Chest: clear to auscultation bilaterally, no wheezing, rales or rhonchi Abdomen: soft nontender, nondistended, normal bowel sounds, no organomegaly Extremities: no cyanosis, clubbing or edema noted bilaterally Neuro: Cranial nerves II-XII intact, no focal neurological deficits   Data Reviewed: Basic Metabolic Panel:  Recent Labs Lab 01/28/13 0630 01/29/13 0410 01/30/13 0445 01/31/13 0447 02/01/13 0635  NA 140 143 141 143 142  K 2.9* 3.4* 4.0 3.8 3.0*  CL 110 114* 109 108 108  CO2 23 23 27 27 28   GLUCOSE 194* 91 96 101* 92  BUN 18 12 10 8 8   CREATININE 0.96 0.87 0.93 0.98 1.04  CALCIUM 7.0* 7.1* 6.6* 7.9* 7.7*   Liver Function Tests: No results found for this basename: AST, ALT, ALKPHOS, BILITOT, PROT, ALBUMIN,  in the last 168 hours No results found for this basename: LIPASE, AMYLASE,  in the last 168 hours No results found for this basename: AMMONIA,  in the last 168 hours CBC:  Recent Labs Lab 01/28/13 1900 01/29/13 0410 01/30/13 0445 01/31/13 0447 02/01/13 0635  WBC 10.6* 9.8 8.7 8.6 7.9  HGB 9.4* 9.0* 9.4* 9.5* 9.4*  HCT 27.5* 26.0* 27.7* 29.4* 29.1*  MCV 87.6 88.1 89.9 91.0 90.9  PLT 144* 135* 184  232 268   Cardiac Enzymes: No results found for this basename: CKTOTAL, CKMB, CKMBINDEX, TROPONINI,  in the last 168 hours BNP (last 3 results)  Recent Labs  01/22/13 2220  PROBNP 741.6*   CBG:  Recent Labs Lab 01/26/13 1654 01/26/13 1929 01/26/13 2353 01/27/13 0711 01/27/13 1512  GLUCAP 93 102* 94 90 103*    Recent Results (from the past 240 hour(s))  MRSA PCR SCREENING     Status: None   Collection Time    01/23/13  2:22 AM      Result Value Range Status   MRSA by PCR  NEGATIVE  NEGATIVE Final   Comment:            The GeneXpert MRSA Assay (FDA     approved for NASAL specimens     only), is one component of a     comprehensive MRSA colonization     surveillance program. It is not     intended to diagnose MRSA     infection nor to guide or     monitor treatment for     MRSA infections.     Studies: No results found.  Scheduled Meds: . feeding supplement  237 mL Oral BID BM  . pantoprazole  40 mg Oral Q1200  . sodium chloride  3 mL Intravenous Q12H   Continuous Infusions: . sodium chloride 10 mL/hr at 02/01/13 0600    Active Problems:   Coronary atherosclerosis of native coronary artery   Essential hypertension, benign   Anemia   Upper GI bleed   Confusion   Hypotension   Hemorrhagic shock   Acute respiratory failure   NSTEMI (non-ST elevated myocardial infarction)   Hypokalemia   Physical deconditioning    Time spent: 35 minutes    [facility] A  [facility] Pager [phone]. If 7PM-7AM, please contact night-coverage at [email or web address], password [facility] 02/01/2013, 10:21 AM  LOS: 10 days

## 2013-02-02 LAB — BASIC METABOLIC PANEL
BUN: 9 mg/dL (ref 6–23)
Creatinine, Ser: 1.04 mg/dL (ref 0.50–1.35)
GFR calc Af Amer: 77 mL/min — ABNORMAL LOW (ref >90)
GFR calc non Af Amer: 67 mL/min — ABNORMAL LOW (ref >90)
Glucose, Bld: 104 mg/dL — ABNORMAL HIGH (ref 70–99)
Potassium: 5.4 mEq/L — ABNORMAL HIGH (ref 3.5–5.1)

## 2013-02-02 LAB — CBC
Hemoglobin: 10 g/dL — ABNORMAL LOW (ref 13.0–17.0)
MCH: 29 pg (ref 26.0–34.0)
MCHC: 31.4 g/dL (ref 30.0–36.0)
RDW: 16.3 % — ABNORMAL HIGH (ref 11.5–15.5)

## 2013-02-02 MED ORDER — PANTOPRAZOLE SODIUM 40 MG PO TBEC: 40.0000 mg | DELAYED_RELEASE_TABLET | Freq: Every day | ORAL | Status: DC

## 2013-02-02 NOTE — Progress Notes (Signed)
Physical Therapy Treatment Patient Details Name: [name] MRN: [id number] DOB: 09-18-1932 Today's Date: 02/02/2013 Time: 6295-2841 PT Time Calculation (min): 26 min  PT Assessment / Plan / Recommendation  PT Comments   Pt ambulated 200 feet with min assist to steady and performed exercises in recliner.  Follow Up Recommendations  Home health PT;Supervision/Assistance - 24 hour     Does the patient have the potential to tolerate intense rehabilitation     Barriers to Discharge        Equipment Recommendations  None recommended by PT    Recommendations for Other Services    Frequency     Progress towards PT Goals Progress towards PT goals: Progressing toward goals  Plan Current plan remains appropriate    Precautions / Restrictions Precautions Precautions: Fall Restrictions Weight Bearing Restrictions: No   Pertinent Vitals/Pain Pt denies pain    Mobility  Bed Mobility Bed Mobility: Supine to Sit Supine to Sit: 5: Supervision;HOB elevated;With rails Transfers Transfers: Sit to Stand;Stand to Sit Sit to Stand: 4: Min assist;With upper extremity assist;From bed Stand to Sit: To chair/3-in-1;4: Min guard;Without upper extremity assist Details for Transfer Assistance: verbal cues for safe technique, assist to rise, pt did not reach back for armrests despite cues Ambulation/Gait Ambulation/Gait Assistance: 4: Min assist Ambulation Distance (Feet): 200 Feet Assistive device: Rolling walker Ambulation/Gait Assistance Details: pt able to tolerate greater distance ambulation today with RW, assist to steady esp with narrow spaces or turning Gait Pattern: Decreased stride length;Shuffle;Narrow base of support;Step-through pattern    Exercises General Exercises - Lower Extremity Ankle Circles/Pumps: AROM;Both;Supine;15 reps Quad Sets: AROM;Both;15 reps;Supine Gluteal Sets: AROM;Both;15 reps;Supine Long Arc Quad: AROM;Both;Seated;20 reps Hip ABduction/ADduction:  Both;Supine;AROM;15 reps Straight Leg Raises: AROM;Both;15 reps;Supine Hip Flexion/Marching: AROM;Both;Seated;15 reps   PT Diagnosis:    PT Problem List:   PT Treatment Interventions:     PT Goals (current goals can now be found in the care plan section)    Visit Information  Last PT Received On: 02/02/13 Assistance Needed: +1    Subjective Data      Cognition  Cognition Arousal/Alertness: Awake/alert Behavior During Therapy: [facility] for tasks assessed/performed Overall Cognitive Status: Within Functional Limits for tasks assessed    Balance     End of Session PT - End of Session Equipment Utilized During Treatment: Gait belt Activity Tolerance: Patient tolerated treatment well Patient left: in chair;with call bell/phone within reach   GP     Libbie Bartley,KATHrine E 02/02/2013, 11:07 AM Zenovia Jarred, PT, DPT 02/02/2013 Pager: [phone]

## 2013-02-02 NOTE — Progress Notes (Signed)
NURSING PROGRESS NOTE  [name] [id number] Discharge Data: 02/02/2013 3:27 PM Attending Provider: Clydia Llano, MD YQM:VHQION, CYNTHIA, DO     [name] to be D/C'd Home per MD order.    All IV's discontinued with no bleeding noted.  All belongings returned to patient for patient to take home.   Last Vital Signs:  Blood pressure 146/75, pulse 55, temperature 98.4 F (36.9 C), temperature source Oral, resp. rate 16, height 5\' 5"  (1.651 m), weight 65.3 kg (143 lb 15.4 oz), SpO2 100.00%.  Discharge Medication List   Medication List    STOP taking these medications       aspirin 325 MG tablet      TAKE these medications       atenolol 25 MG tablet  Commonly known as:  TENORMIN  Take 25 mg by mouth daily.     citalopram 10 MG tablet  Commonly known as:  CELEXA  Take 20 mg by mouth at bedtime.     ezetimibe 10 MG tablet  Commonly known as:  ZETIA  Take 10 mg by mouth daily.     felodipine 5 MG 24 hr tablet  Commonly known as:  PLENDIL  Take 5 mg by mouth daily.     furosemide 20 MG tablet  Commonly known as:  LASIX  Take 1 tablet (20 mg total) by mouth daily.     irbesartan 75 MG tablet  Commonly known as:  AVAPRO  Take 75 mg by mouth at bedtime.     oxyCODONE-acetaminophen 10-325 MG per tablet  Commonly known as:  PERCOCET  Take 1 tablet by mouth every 6 (six) hours as needed. For pain     pantoprazole 40 MG tablet  Commonly known as:  PROTONIX  Take 1 tablet (40 mg total) by mouth daily at 12 noon.     potassium chloride 10 MEQ tablet  Commonly known as:  K-DUR  Take 1 tablet (10 mEq total) by mouth daily.     risperiDONE 0.5 MG tablet  Commonly known as:  RISPERDAL  Take 0.5 mg by mouth 2 (two) times daily.     simvastatin 20 MG tablet  Commonly known as:  ZOCOR  Take 20 mg by mouth at bedtime.     vitamin B-1 250 MG tablet  Take 250 mg by mouth daily.        Madelin Rear, MSN, RN, [employer]

## 2013-02-02 NOTE — Discharge Summary (Signed)
Physician Discharge Summary  KAMARI BUCH HYQ:[id number] DOB: [birth year] DOA: 01/22/2013  PCP: Samuel Jester, DO  Admit date: 01/22/2013 Discharge date: 02/02/2013  Time spent: *40* minutes  Recommendations for Outpatient Follow-up:  1. Followup with primary care physician in one week. 2. Check CBC.  Discharge Diagnoses:  Active Problems:   Coronary atherosclerosis of native coronary artery   Essential hypertension, benign   Anemia   Upper GI bleed   Confusion   Hypotension   Hemorrhagic shock   Acute respiratory failure   NSTEMI (non-ST elevated myocardial infarction)   Hypokalemia   Physical deconditioning   Discharge Condition: Stable  Diet recommendation: Heart healthy diet  Filed Weights   01/23/13 0600 01/29/13 0959  Weight: 60 kg (132 lb 4.4 oz) 65.3 kg (143 lb 15.4 oz)    History of present illness:  [name] is a 79 y.o. male  has a past medical history of Coronary atherosclerosis of native coronary artery; Essential hypertension, benign; Mixed hyperlipidemia; Erectile dysfunction; Osteoarthritis; Myocardial infarction; and Rheumatoid arthritis, adult.  Presented with  Confusion since AM he does have some baseline dementia but at baseline very active and close to normal cognition.  He had been lethargic as well. He had an episosede where his responsiveness declined patient have been very lethargic since arrival to ED. Low blood pressures were noted in ED with systolics in 90's.  In ED he was noted to have anemia with Hg down to 7.5. He was hem positive from below with black stools. Gi consult aware and will see in AM. Hospitalist to admit.  During my evaluation patient was noted to be become hypotensive transiently with SBP down to 60's during this episode he became severely lethargic. After getting IVF bollus his blood pressure and mental status responded rapidly and current SBP in 120'   Hospital Course:   GI bleed  -Secondary to Mallory-Weiss tear,  status post EGD.  -Seems to be no bleeding so far, monitor closely.  -Continue Protonix per GI recommendation.   Anemia, acute blood loss  -Status post transfusion of 14 units of packed RBCs.  -Also has low platelets and status post 1 unit of pheresed platelets.  -This is secondary to acute GI bleeding, resolved.  Non-STEMI  -Troponin increased to 1.1, likely secondary to demand ischemia from significant anemia and hemorrhagic shock.  -On beta blockers, no heparin or aspirin because of concurrent bleeding.   -On discharge patient on aspirin is on hold, continue beta blockers. Restart aspirin in 1-2 weeks.   Acute encephalopathy  -Unclear etiology, could be secondary to the acute illness from hemorrhagic shock. but resolved.   Acute respiratory failure  -Patient was intubated and mechanically ventilated because of combativeness and acute encephalopathy.  -Patient had lethargy after admission still intubated and mechanically ventilated. -At that time he needed to have EGD done.   Consultants:  [facility] Procedures:  EGD done by Dr. Bosie Clos  Intubation and mechanical ventilation, intubation on 6/20 and extubation on 6/22   Discharge Exam: Filed Vitals:   02/01/13 0511 02/01/13 1444 02/01/13 2207 02/02/13 0531  BP: 146/57 149/72 148/54 163/70  Pulse: 57 60 57 58  Temp: 98.2 F (36.8 C) 98.3 F (36.8 C) 98.1 F (36.7 C) 98.2 F (36.8 C)  TempSrc: Oral Oral Oral Oral  Resp: 20 18 18    Height:      Weight:      SpO2: 95% 98% 97% 98%   General: Alert and awake, oriented x3, not in  any acute distress. HEENT: anicteric sclera, pupils reactive to light and accommodation, EOMI CVS: S1-S2 clear, no murmur rubs or gallops Chest: clear to auscultation bilaterally, no wheezing, rales or rhonchi Abdomen: soft nontender, nondistended, normal bowel sounds, no organomegaly Extremities: no cyanosis, clubbing or edema noted bilaterally Neuro: Cranial nerves II-XII intact,  no focal neurological deficits  Discharge Instructions      Discharge Orders   Future Appointments Provider Department Dept Phone   02/19/2013 2:20 PM Jonelle Sidle, MD [facility] [place] [phone]   Future Orders Complete By Expires     Diet - low sodium heart healthy  As directed     Increase activity slowly  As directed         Medication List    STOP taking these medications       aspirin 325 MG tablet      TAKE these medications       atenolol 25 MG tablet  Commonly known as:  TENORMIN  Take 25 mg by mouth daily.     citalopram 10 MG tablet  Commonly known as:  CELEXA  Take 20 mg by mouth at bedtime.     ezetimibe 10 MG tablet  Commonly known as:  ZETIA  Take 10 mg by mouth daily.     felodipine 5 MG 24 hr tablet  Commonly known as:  PLENDIL  Take 5 mg by mouth daily.     furosemide 20 MG tablet  Commonly known as:  LASIX  Take 1 tablet (20 mg total) by mouth daily.     irbesartan 75 MG tablet  Commonly known as:  AVAPRO  Take 75 mg by mouth at bedtime.     oxyCODONE-acetaminophen 10-325 MG per tablet  Commonly known as:  PERCOCET  Take 1 tablet by mouth every 6 (six) hours as needed. For pain     pantoprazole 40 MG tablet  Commonly known as:  PROTONIX  Take 1 tablet (40 mg total) by mouth daily at 12 noon.     potassium chloride 10 MEQ tablet  Commonly known as:  K-DUR  Take 1 tablet (10 mEq total) by mouth daily.     risperiDONE 0.5 MG tablet  Commonly known as:  RISPERDAL  Take 0.5 mg by mouth 2 (two) times daily.     simvastatin 20 MG tablet  Commonly known as:  ZOCOR  Take 20 mg by mouth at bedtime.     vitamin B-1 250 MG tablet  Take 250 mg by mouth daily.       No Known Allergies Follow-up Information   Follow up with BUTLER, CYNTHIA, DO In 1 week.   Contact information:   [address] [place] [address] [phone]        The results of significant diagnostics from this hospitalization  (including imaging, microbiology, ancillary and laboratory) are listed below for reference.    Significant Diagnostic Studies: Dg Chest 2 View  01/22/2013   *RADIOLOGY REPORT*  Clinical Data: Loss of consciousness.  CHEST - 2 VIEW  Comparison: Chest x-ray 02/03/2012.  Findings: Lung volumes are normal.  No consolidative airspace disease.  No pleural effusions.  No pneumothorax.  No pulmonary nodule or mass noted.  Pulmonary vasculature and the cardiomediastinal silhouette are within normal limits. Atherosclerosis of the thoracic aorta.  Status post median sternotomy.  IMPRESSION: 1. No radiographic evidence of acute cardiopulmonary disease. 2.  Atherosclerosis.   Original Report Authenticated By: Trudie Reed, M.D.   Nm Gi  Blood Loss  01/26/2013   *RADIOLOGY REPORT*  Clinical Data: History of Mallory Weiss tear repaired 01/23/2013 with persistent bleeding  NUCLEAR MEDICINE GASTROINTESTINAL BLEEDING SCAN  Technique:  Sequential abdominal images were obtained following intravenous administration of Tc-63m labeled red blood cells.  Radiopharmaceutical: CURIE ULTRATAG TECHNETIUM TC 54M- LABELED RED BLOOD CELLS IV KIT  Comparison:  None.  Findings:  There is normal intravascular blood pool seen within the heart, abdominal aorta and pelvic arterial system.  Free pertechnetate is seen within the stomach which does not move during the 120 minutes of planar imaging.  There is no definitive extravascular activity.  IMPRESSION:  Negative tagged red blood cell scan.   Original Report Authenticated By: Tacey Ruiz, MD   Dg Chest Port 1 View  01/25/2013   *RADIOLOGY REPORT*  Clinical Data: Respiratory distress  PORTABLE CHEST - 1 VIEW  Comparison: January 25, 2013.  Findings: Sternotomy wires are noted.  Right internal jugular catheter is unchanged.  Endotracheal tube has been removed. Cardiomediastinal silhouette appears normal.  No acute pulmonary disease is noted.  No pleural effusion or pneumothorax is  noted.  IMPRESSION: No acute cardiopulmonary abnormality seen.   Original Report Authenticated By: Lupita Raider.,  M.D.   Dg Chest Port 1 View  01/25/2013   *RADIOLOGY REPORT*  Clinical Data: Follow-up atelectasis  PORTABLE CHEST - 1 VIEW  Comparison: 01/23/2013  Findings: Endotracheal tube and right central venous line are unchanged.  Stable cardiac silhouette.  No effusion, infiltrate, pneumothorax. Mild left basilar atelectasis.  IMPRESSION:  1.  Stable support apparatus. 2.  Mild left basilar atelectasis.   Original Report Authenticated By: Genevive Bi, M.D.   Dg Chest Port 1 View  01/23/2013   *RADIOLOGY REPORT*  Clinical Data: Intubation.  GI bleed.  PORTABLE CHEST - 1 VIEW 6:23 p.m.  Comparison: 01/23/2013 at 5:16 p.m.  Findings: Endotracheal tube has been inserted and the tip is in good position 4.8 cm above the carina.  Right jugular vein catheter tip is in the superior vena cava at the level of the azygos vein. Heart size and vascularity are normal.  Lungs are clear.  No pneumothorax.  IMPRESSION: Endotracheal tube in good position.  Clear lungs.   Original Report Authenticated By: Francene Boyers, M.D.   Dg Chest Port 1 View  01/23/2013   *RADIOLOGY REPORT*  Clinical Data: Right IJ catheter placement.  PORTABLE CHEST - 1 VIEW  Comparison: 01/23/2013.  Findings: The right IJ Cordis is stable.  The IJ catheter tip is in the mid SVC just above the carina.  The cardiac silhouette, mediastinal and hilar contours are stable.  The lungs remain clear. Slightly lower lung volumes with minimal bibasilar atelectasis.  IMPRESSION:  1.  Right IJ catheter tip is in the region of the mid SVC. 2.  Slightly low lung volumes with bibasilar atelectasis.   Original Report Authenticated By: Rudie Meyer, M.D.   Dg Chest Port 1 View  01/23/2013   *RADIOLOGY REPORT*  Clinical Data: Status post central line placement.  PORTABLE CHEST - 1 VIEW  Comparison: Chest x-ray 01/22/2013.  Findings: There is a right-sided  internal jugular central venous catheter with tip terminating in the distal right internal jugular vein.  No pneumothorax.  No acute consolidative airspace disease. No pleural effusions.  Pulmonary vasculature is within normal limits.  Heart size is normal.  Mediastinal contours are unremarkable.  Atherosclerosis in the thoracic aorta.  Status post median sternotomy.  IMPRESSION: 1.  Tip of right  internal jugular central venous catheter appears to be in the distal right internal jugular vein.  No pneumothorax. 2.  No radiographic evidence of acute cardiopulmonary disease. 3.  Atherosclerosis.   Original Report Authenticated By: Trudie Reed, M.D.    Microbiology: No results found for this or any previous visit (from the past 240 hour(s)).   Labs: Basic Metabolic Panel:  Recent Labs Lab 01/29/13 0410 01/30/13 0445 01/31/13 0447 02/01/13 0635 02/02/13 0902  NA 143 141 143 142 144  K 3.4* 4.0 3.8 3.0* 5.4*  CL 114* 109 108 108 110  CO2 23 27 27 28 25   GLUCOSE 91 96 101* 92 104*  BUN 12 10 8 8 9   CREATININE 0.87 0.93 0.98 1.04 1.04  CALCIUM 7.1* 6.6* 7.9* 7.7* 8.5   Liver Function Tests: No results found for this basename: AST, ALT, ALKPHOS, BILITOT, PROT, ALBUMIN,  in the last 168 hours No results found for this basename: LIPASE, AMYLASE,  in the last 168 hours No results found for this basename: AMMONIA,  in the last 168 hours CBC:  Recent Labs Lab 01/29/13 0410 01/30/13 0445 01/31/13 0447 02/01/13 0635 02/02/13 0902  WBC 9.8 8.7 8.6 7.9 8.9  HGB 9.0* 9.4* 9.5* 9.4* 10.0*  HCT 26.0* 27.7* 29.4* 29.1* 31.8*  MCV 88.1 89.9 91.0 90.9 92.2  PLT 135* 184 232 268 307   Cardiac Enzymes: No results found for this basename: CKTOTAL, CKMB, CKMBINDEX, TROPONINI,  in the last 168 hours BNP: BNP (last 3 results)  Recent Labs  01/22/13 2220  PROBNP 741.6*   CBG:  Recent Labs Lab 01/26/13 1654 01/26/13 1929 01/26/13 2353 01/27/13 0711 01/27/13 1512  GLUCAP 93 102* 94  90 103*       Signed:  Robley Matassa A  [facility] 02/02/2013, 12:27 PM

## 2013-02-19 ENCOUNTER — Encounter: Payer: Self-pay | Admitting: Cardiology

## 2013-02-19 ENCOUNTER — Encounter: Payer: Medicare Other | Admitting: Cardiology

## 2013-02-19 NOTE — Progress Notes (Signed)
Patient canceled.  This encounter was created in error - please disregard. 

## 2013-03-19 ENCOUNTER — Encounter: Payer: Self-pay | Admitting: Cardiology

## 2013-03-19 ENCOUNTER — Ambulatory Visit (INDEPENDENT_AMBULATORY_CARE_PROVIDER_SITE_OTHER): Payer: Medicare Other | Admitting: Cardiology

## 2013-03-19 VITALS — BP 128/70 | HR 64 | Ht 65.0 in | Wt 123.8 lb

## 2013-03-19 DIAGNOSIS — E782 Mixed hyperlipidemia: Secondary | ICD-10-CM

## 2013-03-19 DIAGNOSIS — I2510 Atherosclerotic heart disease of native coronary artery without angina pectoris: Secondary | ICD-10-CM

## 2013-03-19 DIAGNOSIS — I10 Essential (primary) hypertension: Secondary | ICD-10-CM

## 2013-03-19 NOTE — Assessment & Plan Note (Signed)
History of multivessel disease status post CABG. He had a type II NSTEMI in the setting of hypovolemic shock with major GI bleed as outlined. He is clinically stable at this time on reasonable medical regimen, although still holding off on aspirin for now. It sounds as if he had a Mallory-Weiss tear, and another source of bleeding that was not identified. Followup echocardiogram to be obtained for reassessment of LV function.

## 2013-03-19 NOTE — Assessment & Plan Note (Signed)
Continue Zocor 

## 2013-03-19 NOTE — Assessment & Plan Note (Signed)
Reasonable blood pressure control. 

## 2013-03-19 NOTE — Progress Notes (Signed)
Clinical Summary [name] is a 79 y.o.male last seen in September 2013.  Records reviewed finding hospital admission in June with mental status changes associated with hypotension due to acute anemia and GI bleeding with Mallory-Weiss tear by EGD. He had a substantial bleed requiring multiple packed red cell transfusions. He did manifest a type II NSTEMI and was managed medically, aspirin was held has not been resumed.  He presents with no chest pain symptoms, states that he has been doing reasonably well. No abdominal pain, no obvious melena or hematochezia. Reports compliance with his cardiac medications. We'll be seeing Dr. Charm Barges soon for primary care followup.  We reviewed his prior cardiac testing, no recent reassessment of LVEF. We discussed arranging a followup echocardiogram to at least ensure stability in his LV function. Otherwise continuing medical therapy, not yet comfortable resuming antiplatelet therapy however.   No Known Allergies  Current Outpatient Prescriptions  Medication Sig Dispense Refill  . atenolol (TENORMIN) 25 MG tablet Take 25 mg by mouth daily.       . citalopram (CELEXA) 10 MG tablet Take 20 mg by mouth at bedtime.       Marland Kitchen DIOVAN 80 MG tablet Take 80 mg by mouth daily.       Marland Kitchen ezetimibe (ZETIA) 10 MG tablet Take 10 mg by mouth daily.        . felodipine (PLENDIL) 5 MG 24 hr tablet Take 5 mg by mouth daily.       . furosemide (LASIX) 20 MG tablet Take 1 tablet (20 mg total) by mouth daily.  30 tablet  6  . irbesartan (AVAPRO) 75 MG tablet Take 75 mg by mouth at bedtime.      Marland Kitchen oxyCODONE-acetaminophen (PERCOCET) 10-325 MG per tablet Take 1 tablet by mouth every 6 (six) hours as needed. For pain      . pantoprazole (PROTONIX) 40 MG tablet Take 1 tablet (40 mg total) by mouth daily at 12 noon.  30 tablet  0  . potassium chloride (K-DUR) 10 MEQ tablet Take 1 tablet (10 mEq total) by mouth daily.  30 tablet  6  . risperiDONE (RISPERDAL) 0.5 MG tablet Take 0.5 mg  by mouth 2 (two) times daily.      . simvastatin (ZOCOR) 20 MG tablet Take 20 mg by mouth at bedtime.       . Thiamine HCl (VITAMIN B-1) 250 MG tablet Take 250 mg by mouth daily.         No current facility-administered medications for this visit.    Past Medical History  Diagnosis Date  . Coronary atherosclerosis of native coronary artery     Multivessel  . Essential hypertension, benign   . Mixed hyperlipidemia   . Erectile dysfunction   . Osteoarthritis   . Myocardial infarction     IMI 1987 and PLMI 1997  . Rheumatoid arthritis, adult     Past Surgical History  Procedure Laterality Date  . Coronary artery bypass graft  1987  . Carpal tunnel release    . Rotator cuff repair    . Neck surgery    . Esophagogastroduodenoscopy N/A 01/23/2013    Procedure: ESOPHAGOGASTRODUODENOSCOPY (EGD);  Surgeon: Shirley Friar, MD;  Location: [facility] ENDOSCOPY;  Service: Endoscopy;  Laterality: N/A;  . Esophagogastroduodenoscopy N/A 01/23/2013    Procedure: ESOPHAGOGASTRODUODENOSCOPY (EGD);  Surgeon: Shirley Friar, MD;  Location: [facility] ENDOSCOPY;  Service: Endoscopy;  Laterality: N/A;    Social History [name] reports that he has been  smoking Cigarettes.  He has a 15 pack-year smoking history. He has never used smokeless tobacco. [name] reports that he does not drink alcohol.  Review of Systems   Physical Examination Filed Vitals:   03/19/13 1418  BP: 128/70  Pulse: 64   Filed Weights   03/19/13 1418  Weight: 123 lb 12 oz (56.133 kg)    Chronically ill appearing male, appears comfortable.  HEENT: Conjuctivae normal, oropharynx with poor dentition.  Neck: No bruits, no thyromegaly.  Lungs: Decreased breath sound without wheezes.  Cardiac: Regular rate and rhythm with soft basal systolic murmur.  Abdomen: Soft, NABS.  Skin: Warm and dry.  Extremities: Arthritic deformities, no pitting edema below.  Neuropsychiatric: Alert and oriented x 3, affect  appropriate.   Problem List and Plan   Coronary atherosclerosis of native coronary artery History of multivessel disease status post CABG. He had a type II NSTEMI in the setting of hypovolemic shock with major GI bleed as outlined. He is clinically stable at this time on reasonable medical regimen, although still holding off on aspirin for now. It sounds as if he had a Mallory-Weiss tear, and another source of bleeding that was not identified. Followup echocardiogram to be obtained for reassessment of LV function.  Essential hypertension, benign Reasonable blood pressure control.  Mixed hyperlipidemia Continue Zocor.    Jonelle Sidle, M.D., F.A.C.C.

## 2013-03-19 NOTE — Patient Instructions (Addendum)
Your physician recommends that you schedule a follow-up appointment in: 3 MONTHS  Your physician recommends that you continue on your current medications as directed. Please refer to the Current Medication list given to you today.  Your physician has requested that you have an echocardiogram. Echocardiography is a painless test that uses sound waves to create images of your heart. It provides your doctor with information about the size and shape of your heart and how well your heart's chambers and valves are working. This procedure takes approximately one hour. There are no restrictions for this procedure.WE WILL CALL YOU WITH THE RESULTS

## 2013-03-24 ENCOUNTER — Ambulatory Visit (HOSPITAL_COMMUNITY)
Admission: RE | Admit: 2013-03-24 | Discharge: 2013-03-24 | Disposition: A | Discharge status: Home / Self Care | Payer: Medicare Other | Source: Ambulatory Visit | Attending: Cardiology | Admitting: Cardiology

## 2013-03-24 DIAGNOSIS — F17200 Nicotine dependence, unspecified, uncomplicated: Secondary | ICD-10-CM | POA: Insufficient documentation

## 2013-03-24 DIAGNOSIS — I2510 Atherosclerotic heart disease of native coronary artery without angina pectoris: Secondary | ICD-10-CM | POA: Insufficient documentation

## 2013-03-24 DIAGNOSIS — I059 Rheumatic mitral valve disease, unspecified: Secondary | ICD-10-CM

## 2013-03-24 DIAGNOSIS — E785 Hyperlipidemia, unspecified: Secondary | ICD-10-CM | POA: Insufficient documentation

## 2013-03-24 DIAGNOSIS — I080 Rheumatic disorders of both mitral and aortic valves: Secondary | ICD-10-CM | POA: Insufficient documentation

## 2013-03-24 NOTE — Progress Notes (Signed)
*  PRELIMINARY RESULTS* Echocardiogram 2D Echocardiogram has been performed.  [name] 03/24/2013, 1:31 PM 

## 2013-04-07 ENCOUNTER — Telehealth: Payer: Self-pay | Admitting: *Deleted

## 2013-04-07 NOTE — Telephone Encounter (Signed)
Spoke to pt to advise results/instructions. Pt understood.pt daughter did not recall speaking with this nurse on 03-25-13, pt noted only one daughter, advised pt results, pt understood

## 2013-04-07 NOTE — Telephone Encounter (Signed)
PT daughter in calling for 2-d echo results

## 2013-04-28 ENCOUNTER — Encounter: Payer: Self-pay | Admitting: Cardiology

## 2013-06-19 ENCOUNTER — Ambulatory Visit: Payer: Medicare Other | Admitting: Cardiology

## 2013-06-22 ENCOUNTER — Ambulatory Visit: Payer: Medicare Other | Admitting: Cardiology

## 2013-06-29 ENCOUNTER — Encounter: Payer: Self-pay | Admitting: Cardiology

## 2013-06-29 ENCOUNTER — Ambulatory Visit (INDEPENDENT_AMBULATORY_CARE_PROVIDER_SITE_OTHER): Payer: Medicare Other | Admitting: Cardiology

## 2013-06-29 VITALS — BP 133/63 | HR 55 | Ht 66.0 in | Wt 120.5 lb

## 2013-06-29 DIAGNOSIS — E782 Mixed hyperlipidemia: Secondary | ICD-10-CM

## 2013-06-29 DIAGNOSIS — I10 Essential (primary) hypertension: Secondary | ICD-10-CM

## 2013-06-29 DIAGNOSIS — I2510 Atherosclerotic heart disease of native coronary artery without angina pectoris: Secondary | ICD-10-CM

## 2013-06-29 MED ORDER — NITROGLYCERIN 0.4 MG SL SUBL: 0.4000 mg | SUBLINGUAL_TABLET | SUBLINGUAL | Status: AC | PRN

## 2013-06-29 MED ORDER — NITROGLYCERIN 0.4 MG SL SUBL: 0.4000 mg | SUBLINGUAL_TABLET | SUBLINGUAL | Status: DC | PRN

## 2013-06-29 NOTE — Assessment & Plan Note (Signed)
Continues on Zocor. Lipids followed by Dr. Charm Barges.

## 2013-06-29 NOTE — Patient Instructions (Addendum)
Your physician wants you to follow-up in: 4 months You will receive a reminder letter in the mail two months in advance. If you don't receive a letter, please call our office to schedule the follow-up appointment.     .Your physician recommends that you continue on your current medications as directed. Please refer to the Current Medication list given to you today.  

## 2013-06-29 NOTE — Progress Notes (Signed)
Clinical Summary Mr. Friday is a 79 y.o.male last seen in August. He presents with his daughter. Reports no progressive chest pain or increasing nitroglycerin requirement. Medications reviewed below. He has not been rehospitalized.  Echocardiogram from August showed mild LVH with LVEF 50-55%, inferolateral wall motion abnormalities, grade 1 diastolic dysfunction, sclerotic aortic valve, MAC with mild mitral regurgitation, mild left atrial enlargement, normal CVP, no pericardial effusion.  He will be seeing Dr. Charm Barges for a routine visit tomorrow.   No Known Allergies  Current Outpatient Prescriptions  Medication Sig Dispense Refill  . atenolol (TENORMIN) 25 MG tablet Take 25 mg by mouth daily.       . AVODART 0.5 MG capsule Take 0.5 mg by mouth daily.       . citalopram (CELEXA) 10 MG tablet Take 20 mg by mouth at bedtime.       Marland Kitchen DIOVAN 80 MG tablet Take 80 mg by mouth daily.       . diphenoxylate-atropine (LOMOTIL) 2.5-0.025 MG per tablet       . ezetimibe (ZETIA) 10 MG tablet Take 10 mg by mouth daily.        . felodipine (PLENDIL) 5 MG 24 hr tablet Take 5 mg by mouth daily.       . furosemide (LASIX) 20 MG tablet Take 1 tablet (20 mg total) by mouth daily.  30 tablet  6  . irbesartan (AVAPRO) 75 MG tablet Take 75 mg by mouth at bedtime.      . nitroGLYCERIN (NITROSTAT) 0.4 MG SL tablet Place 1 tablet (0.4 mg total) under the tongue every 5 (five) minutes as needed for chest pain.  25 tablet  6  . oxyCODONE-acetaminophen (PERCOCET) 10-325 MG per tablet Take 1 tablet by mouth every 6 (six) hours as needed. For pain      . potassium chloride (K-DUR) 10 MEQ tablet Take 1 tablet (10 mEq total) by mouth daily.  30 tablet  6  . risperiDONE (RISPERDAL) 0.5 MG tablet Take 0.5 mg by mouth 2 (two) times daily.      . simvastatin (ZOCOR) 20 MG tablet Take 20 mg by mouth at bedtime.       . tamsulosin (FLOMAX) 0.4 MG CAPS capsule       . Thiamine HCl (VITAMIN B-1) 250 MG tablet Take 250 mg  by mouth daily.         No current facility-administered medications for this visit.    Past Medical History  Diagnosis Date  . Coronary atherosclerosis of native coronary artery     Multivessel  . Essential hypertension, benign   . Mixed hyperlipidemia   . Erectile dysfunction   . Osteoarthritis   . Myocardial infarction     IMI 1987 and PLMI 1997  . Rheumatoid arthritis, adult     Past Surgical History  Procedure Laterality Date  . Coronary artery bypass graft  1987  . Carpal tunnel release    . Rotator cuff repair    . Neck surgery    . Esophagogastroduodenoscopy N/A 01/23/2013    Procedure: ESOPHAGOGASTRODUODENOSCOPY (EGD);  Surgeon: Shirley Friar, MD;  Location: [facility] ENDOSCOPY;  Service: Endoscopy;  Laterality: N/A;  . Esophagogastroduodenoscopy N/A 01/23/2013    Procedure: ESOPHAGOGASTRODUODENOSCOPY (EGD);  Surgeon: Shirley Friar, MD;  Location: [facility] ENDOSCOPY;  Service: Endoscopy;  Laterality: N/A;    Social History Mr. Stefanelli reports that he quit smoking about 1 years ago. His smoking use included Cigarettes. He has a 15 pack-year smoking  history. He has never used smokeless tobacco. Mr. Whitby reports that he does not drink alcohol.  Review of Systems Chronically limited social capacity as before. No falls. No bleeding episodes. Otherwise as outlined.  Physical Examination Filed Vitals:   06/29/13 1432  BP: 133/63  Pulse: 55   Filed Weights   06/29/13 1432  Weight: 120 lb 8 oz (54.658 kg)    Chronically ill appearing male, appears comfortable.  HEENT: Conjuctivae normal, oropharynx with poor dentition.  Neck: No bruits, no thyromegaly.  Lungs: Decreased breath sound without wheezes.  Cardiac: Regular rate and rhythm with soft basal systolic murmur.  Abdomen: Soft, NABS.  Skin: Warm and dry.  Extremities: Arthritic deformities, no pitting edema below.  Neuropsychiatric: Alert and oriented x 3, affect appropriate.   Problem List and Plan    Coronary atherosclerosis of native coronary artery Multivessel disease status post CABG, LVEF 50-55% by recent echocardiogram. No active angina symptoms. Refill for nitroglycerin given to ensure fresh bottle. Followup arranged.  Essential hypertension, benign No change to current regimen. Keep follow up with Dr. Charm Barges.  Mixed hyperlipidemia Continues on Zocor. Lipids followed by Dr. Charm Barges.    Jonelle Sidle, M.D., F.A.C.C.

## 2013-06-29 NOTE — Assessment & Plan Note (Signed)
No change to current regimen. Keep follow up with Dr. Charm Barges.

## 2013-06-29 NOTE — Assessment & Plan Note (Signed)
Multivessel disease status post CABG, LVEF 50-55% by recent echocardiogram. No active angina symptoms. Refill for nitroglycerin given to ensure fresh bottle. Followup arranged.

## 2013-12-01 ENCOUNTER — Ambulatory Visit: Payer: Medicare Other | Admitting: Adult Health

## 2013-12-03 NOTE — Progress Notes (Signed)
HPI: [name] is a 80 year old patient of Dr. Diona Browner her following for ongoing assessment and management of CAD, hypertension, next hyperlipidemia. He was last seen by Dr. Diona Browner in November 2014.  demonstrated an echocardiogram from August showing mild LVH with LVEF of 50-55%, inferior lateral wall motion abnormalities, grade 1 diastolic dysfunction, a sclerotic aortic valve, MAC with mild mitral regurg, mild left atrial enlargement, normal CVP and no pericardial effusion.  The last office visit Dr. Diona Browner continued him on his current medication regimen and gave him refills on cardiac meds. No further testing was planned.  He comes today, without any cardiac complaints. He is going to turn 81 in a couple days and remains as active as possible with limitations related to his arthritis. He is medically compliant, he is avoiding salty foods as much as he can, but admits to some indulgences. Labs are followed by his primary care Dr. Charm Barges.          No Known Allergies  Current Outpatient Prescriptions  Medication Sig Dispense Refill  . atenolol (TENORMIN) 25 MG tablet Take 25 mg by mouth daily.       . AVODART 0.5 MG capsule Take 0.5 mg by mouth daily.       . citalopram (CELEXA) 10 MG tablet Take 20 mg by mouth at bedtime.       Marland Kitchen DIOVAN 80 MG tablet Take 80 mg by mouth daily.       Marland Kitchen ezetimibe (ZETIA) 10 MG tablet Take 10 mg by mouth daily.        . felodipine (PLENDIL) 5 MG 24 hr tablet Take 5 mg by mouth daily.       . furosemide (LASIX) 20 MG tablet Take 1 tablet (20 mg total) by mouth daily.  30 tablet  6  . irbesartan (AVAPRO) 75 MG tablet Take 75 mg by mouth at bedtime.      . nitroGLYCERIN (NITROSTAT) 0.4 MG SL tablet Place 1 tablet (0.4 mg total) under the tongue every 5 (five) minutes as needed for chest pain.  25 tablet  6  . oxyCODONE-acetaminophen (PERCOCET) 10-325 MG per tablet Take 1 tablet by mouth every 6 (six) hours as needed. For pain      . potassium  chloride (K-DUR) 10 MEQ tablet Take 1 tablet (10 mEq total) by mouth daily.  30 tablet  6  . risperiDONE (RISPERDAL) 0.5 MG tablet Take 0.5 mg by mouth 2 (two) times daily.      . simvastatin (ZOCOR) 20 MG tablet Take 20 mg by mouth at bedtime.       . tamsulosin (FLOMAX) 0.4 MG CAPS capsule Take 0.4 mg by mouth daily after supper.       . Thiamine HCl (VITAMIN B-1) 250 MG tablet Take 250 mg by mouth daily.         No current facility-administered medications for this visit.    Past Medical History  Diagnosis Date  . Coronary atherosclerosis of native coronary artery     Multivessel  . Essential hypertension, benign   . Mixed hyperlipidemia   . Erectile dysfunction   . Osteoarthritis   . Myocardial infarction     IMI 1987 and PLMI 1997  . Rheumatoid arthritis, adult     Past Surgical History  Procedure Laterality Date  . Coronary artery bypass graft  1987  . Carpal tunnel release    . Rotator cuff repair    . Neck surgery    . Esophagogastroduodenoscopy  N/A 01/23/2013    Procedure: ESOPHAGOGASTRODUODENOSCOPY (EGD);  Surgeon: Shirley Friar, MD;  Location: [facility] ENDOSCOPY;  Service: Endoscopy;  Laterality: N/A;  . Esophagogastroduodenoscopy N/A 01/23/2013    Procedure: ESOPHAGOGASTRODUODENOSCOPY (EGD);  Surgeon: Shirley Friar, MD;  Location: [facility] ENDOSCOPY;  Service: Endoscopy;  Laterality: N/A;    ROS: NROS  PHYSICAL EXAM BP 153/60  Pulse 85  Ht 5\' 5"  (1.651 m)  Wt 115 lb (52.164 kg)  BMI 19.14 kg/m2 General: Well developed, well nourished, in no acute distress Head: Eyes PERRLA, No xanthomas.   Normal cephalic and atramatic  Lungs: Clear bilaterally to auscultation and percussion. Heart: HRRR S1 S2, distant, without MRG.  Pulses are 2+ & equal.            No carotid bruit. No JVD.  No abdominal bruits. No femoral bruits. Abdomen: Bowel sounds are positive, abdomen soft and non-tender without masses or                  Hernia's noted. Msk:  Back normal, normal  gait, but slow, using a walker for ambulation. Normal strength and tone for age. Extremities: No clubbing, cyanosis or edema.  DP +1 Neuro: Alert and oriented X 3. Psych:  Good affect, responds appropriately   ASSESSMENT AND PLAN

## 2013-12-04 ENCOUNTER — Encounter: Payer: Self-pay | Admitting: Adult Health

## 2013-12-04 ENCOUNTER — Ambulatory Visit (INDEPENDENT_AMBULATORY_CARE_PROVIDER_SITE_OTHER): Payer: Medicare Other | Admitting: Adult Health

## 2013-12-04 VITALS — BP 153/60 | HR 85 | Ht 65.0 in | Wt 115.0 lb

## 2013-12-04 DIAGNOSIS — E782 Mixed hyperlipidemia: Secondary | ICD-10-CM

## 2013-12-04 DIAGNOSIS — I2510 Atherosclerotic heart disease of native coronary artery without angina pectoris: Secondary | ICD-10-CM

## 2013-12-04 DIAGNOSIS — I10 Essential (primary) hypertension: Secondary | ICD-10-CM

## 2013-12-04 NOTE — Patient Instructions (Signed)
Your physician recommends that you schedule a follow-up appointment in: 6 months with Dr McDowell You will receive a reminder letter two months in advance reminding you to call and schedule your appointment. If you don't receive this letter, please contact our office.  Your physician recommends that you continue on your current medications as directed. Please refer to the Current Medication list given to you today.   

## 2013-12-04 NOTE — Progress Notes (Deleted)
Name: [name]    DOB: 10/29/1932  Age: 80 y.o.  MR#: [id number]       PCP:  Samuel Jester, DO      Insurance: Payor: [employer] MEDICARE / Plan: AARP MEDICARE COMPLETE / Product Type: *No Product type* /   CC:    Chief Complaint  Patient presents with  . Coronary Artery Disease  . Hypertension    VS Filed Vitals:   12/04/13 1519  BP: 153/60  Pulse: 85  Height: 5\' 5"  (1.651 m)  Weight: 115 lb (52.164 kg)    Weights Current Weight  12/04/13 115 lb (52.164 kg)  06/29/13 120 lb 8 oz (54.658 kg)  03/19/13 123 lb 12 oz (56.133 kg)    Blood Pressure  BP Readings from Last 3 Encounters:  12/04/13 153/60  06/29/13 133/63  03/19/13 128/70     Admit date:  (Not on file) Last encounter with RMR:  Visit date not found   Allergy Review of patient's allergies indicates no known allergies.  Current Outpatient Prescriptions  Medication Sig Dispense Refill  . atenolol (TENORMIN) 25 MG tablet Take 25 mg by mouth daily.       . AVODART 0.5 MG capsule Take 0.5 mg by mouth daily.       . citalopram (CELEXA) 10 MG tablet Take 20 mg by mouth at bedtime.       Marland Kitchen DIOVAN 80 MG tablet Take 80 mg by mouth daily.       Marland Kitchen ezetimibe (ZETIA) 10 MG tablet Take 10 mg by mouth daily.        . felodipine (PLENDIL) 5 MG 24 hr tablet Take 5 mg by mouth daily.       . furosemide (LASIX) 20 MG tablet Take 1 tablet (20 mg total) by mouth daily.  30 tablet  6  . irbesartan (AVAPRO) 75 MG tablet Take 75 mg by mouth at bedtime.      . nitroGLYCERIN (NITROSTAT) 0.4 MG SL tablet Place 1 tablet (0.4 mg total) under the tongue every 5 (five) minutes as needed for chest pain.  25 tablet  6  . oxyCODONE-acetaminophen (PERCOCET) 10-325 MG per tablet Take 1 tablet by mouth every 6 (six) hours as needed. For pain      . potassium chloride (K-DUR) 10 MEQ tablet Take 1 tablet (10 mEq total) by mouth daily.  30 tablet  6  . risperiDONE (RISPERDAL) 0.5 MG tablet Take 0.5 mg by mouth 2 (two) times daily.      .  simvastatin (ZOCOR) 20 MG tablet Take 20 mg by mouth at bedtime.       . tamsulosin (FLOMAX) 0.4 MG CAPS capsule Take 0.4 mg by mouth daily after supper.       . Thiamine HCl (VITAMIN B-1) 250 MG tablet Take 250 mg by mouth daily.         No current facility-administered medications for this visit.    Discontinued Meds:    Medications Discontinued During This Encounter  Medication Reason  . diphenoxylate-atropine (LOMOTIL) 2.5-0.025 MG per tablet Error    Patient Active Problem List   Diagnosis Date Noted  . Upper GI bleed 01/23/2013  . Major depressive disorder, single episode, severe, specified as with psychotic behavior 02/05/2012  . Rheumatoid arthritis, adult   . Osteoarthritis   . Coronary atherosclerosis of native coronary artery 04/26/2011  . Mixed hyperlipidemia 04/26/2011  . Essential hypertension, benign 04/26/2011    LABS    Component Value Date/Time  NA 144 02/02/2013 0902   NA 142 02/01/2013 0635   NA 143 01/31/2013 0447   K 5.4* 02/02/2013 0902   K 3.0* 02/01/2013 0635   K 3.8 01/31/2013 0447   CL 110 02/02/2013 0902   CL 108 02/01/2013 0635   CL 108 01/31/2013 0447   CO2 25 02/02/2013 0902   CO2 28 02/01/2013 0635   CO2 27 01/31/2013 0447   GLUCOSE 104* 02/02/2013 0902   GLUCOSE 92 02/01/2013 0635   GLUCOSE 101* 01/31/2013 0447   BUN 9 02/02/2013 0902   BUN 8 02/01/2013 0635   BUN 8 01/31/2013 0447   CREATININE 1.04 02/02/2013 0902   CREATININE 1.04 02/01/2013 0635   CREATININE 0.98 01/31/2013 0447   CREATININE 1.22 11/14/2011 1031   CALCIUM 8.5 02/02/2013 0902   CALCIUM 7.7* 02/01/2013 0635   CALCIUM 7.9* 01/31/2013 0447   GFRNONAA 67* 02/02/2013 0902   GFRNONAA 67* 02/01/2013 0635   GFRNONAA 77* 01/31/2013 0447   GFRAA 77* 02/02/2013 0902   GFRAA 77* 02/01/2013 0635   GFRAA 89* 01/31/2013 0447   CMP     Component Value Date/Time   NA 144 02/02/2013 0902   K 5.4* 02/02/2013 0902   CL 110 02/02/2013 0902   CO2 25 02/02/2013 0902   GLUCOSE 104* 02/02/2013 0902   BUN 9  02/02/2013 0902   CREATININE 1.04 02/02/2013 0902   CREATININE 1.22 11/14/2011 1031   CALCIUM 8.5 02/02/2013 0902   PROT 3.7* 01/23/2013 0630   ALBUMIN 2.1* 01/23/2013 0630   AST 12 01/23/2013 0630   ALT 9 01/23/2013 0630   ALKPHOS 28* 01/23/2013 0630   BILITOT 0.3 01/23/2013 0630   GFRNONAA 67* 02/02/2013 0902   GFRAA 77* 02/02/2013 0902       Component Value Date/Time   WBC 8.9 02/02/2013 0902   WBC 7.9 02/01/2013 0635   WBC 8.6 01/31/2013 0447   HGB 10.0* 02/02/2013 0902   HGB 9.4* 02/01/2013 0635   HGB 9.5* 01/31/2013 0447   HCT 31.8* 02/02/2013 0902   HCT 29.1* 02/01/2013 0635   HCT 29.4* 01/31/2013 0447   MCV 92.2 02/02/2013 0902   MCV 90.9 02/01/2013 0635   MCV 91.0 01/31/2013 0447    Lipid Panel     Component Value Date/Time   CHOL 150 06/01/2011 1045   TRIG 125 06/01/2011 1045   HDL 55 06/01/2011 1045   CHOLHDL 2.7 06/01/2011 1045   VLDL 25 06/01/2011 1045   LDLCALC 70 06/01/2011 1045    ABG    Component Value Date/Time   PHART 7.342* 01/23/2013 2212   PCO2ART 39.8 01/23/2013 2212   PO2ART 186.0* 01/23/2013 2212   HCO3 21.0 01/23/2013 2212   TCO2 22.2 01/23/2013 2212   ACIDBASEDEF 3.8* 01/23/2013 2212   O2SAT 99.9 01/23/2013 2212     Lab Results  Component Value Date   TSH 0.442 01/23/2013   BNP (last 3 results)  Recent Labs  01/22/13 2220  PROBNP 741.6*   Cardiac Panel (last 3 results) No results found for this basename: CKTOTAL, CKMB, TROPONINI, RELINDX,  in the last 72 hours  Iron/TIBC/Ferritin No results found for this basename: iron, tibc, ferritin     EKG Orders placed during the hospital encounter of 01/22/13  . EKG 12-LEAD  . EKG 12-LEAD  . ED EKG  . ED EKG  . EKG 12-LEAD  . EKG 12-LEAD  . EKG     Prior Assessment and Plan Problem List as of 12/04/2013     Cardiovascular  and Mediastinum   Coronary atherosclerosis of native coronary artery   Last Assessment & Plan   06/29/2013 Office Visit Written 06/29/2013  2:58 PM by Jonelle Sidle, MD      Multivessel disease status post CABG, LVEF 50-55% by recent echocardiogram. No active angina symptoms. Refill for nitroglycerin given to ensure fresh bottle. Followup arranged.    Essential hypertension, benign   Last Assessment & Plan   06/29/2013 Office Visit Written 06/29/2013  2:58 PM by Jonelle Sidle, MD     No change to current regimen. Keep follow up with Dr. Charm Barges.      Digestive   Upper GI bleed     Musculoskeletal and Integument   Rheumatoid arthritis, adult   Osteoarthritis     Other   Mixed hyperlipidemia   Last Assessment & Plan   06/29/2013 Office Visit Written 06/29/2013  2:58 PM by Jonelle Sidle, MD     Continues on Zocor. Lipids followed by Dr. Charm Barges.    Major depressive disorder, single episode, severe, specified as with psychotic behavior       Imaging: No results found.

## 2013-12-04 NOTE — Assessment & Plan Note (Signed)
Labs are completed his primary care physician.

## 2013-12-04 NOTE — Assessment & Plan Note (Signed)
He is currently without any cardiac complaints. No chest pain, dyspnea on exertion. He is limited on his activity to significant arthritis, and uses a walker for ambulation. He will continue risk management with labs being drawn by her primary care physician Dr. Charm Barges. He has not had to use any nitroglycerin sublingual over the last 6 months. We will see him again in 6 months unless he is symptomatic

## 2013-12-04 NOTE — Assessment & Plan Note (Signed)
Mr. Nuzum is doing well for his age diagnoses and activity levels. Blood pressure is a little elevated today but on recheck was 146/68. I am not finding any abnormalities on his assessment. He will continue his current medication regimen as directed refills are provided as needed. We will see him again in 6 months

## 2014-04-06 ENCOUNTER — Encounter (HOSPITAL_COMMUNITY): Payer: Medicare Other | Admitting: Anesthesiology

## 2014-04-06 ENCOUNTER — Encounter (HOSPITAL_COMMUNITY)
Admission: EM | Disposition: A | Discharge status: Skilled Nursing Facility | Payer: Self-pay | Source: Home / Self Care | Attending: Family Medicine

## 2014-04-06 ENCOUNTER — Emergency Department (HOSPITAL_COMMUNITY): Payer: Medicare Other

## 2014-04-06 ENCOUNTER — Inpatient Hospital Stay (HOSPITAL_COMMUNITY): Payer: Medicare Other

## 2014-04-06 ENCOUNTER — Encounter (HOSPITAL_COMMUNITY): Payer: Self-pay | Admitting: Emergency Medicine

## 2014-04-06 ENCOUNTER — Inpatient Hospital Stay (HOSPITAL_COMMUNITY)
Admission: EM | Admit: 2014-04-06 | Discharge: 2014-04-13 | DRG: 356 | Disposition: A | Discharge status: Skilled Nursing Facility | Payer: Medicare Other | Attending: Family Medicine | Admitting: Family Medicine

## 2014-04-06 ENCOUNTER — Emergency Department (HOSPITAL_COMMUNITY): Payer: Medicare Other | Admitting: Anesthesiology

## 2014-04-06 DIAGNOSIS — Z87891 Personal history of nicotine dependence: Secondary | ICD-10-CM

## 2014-04-06 DIAGNOSIS — I472 Ventricular tachycardia, unspecified: Secondary | ICD-10-CM | POA: Diagnosis not present

## 2014-04-06 DIAGNOSIS — I498 Other specified cardiac arrhythmias: Secondary | ICD-10-CM | POA: Diagnosis present

## 2014-04-06 DIAGNOSIS — K922 Gastrointestinal hemorrhage, unspecified: Secondary | ICD-10-CM | POA: Diagnosis present

## 2014-04-06 DIAGNOSIS — J9600 Acute respiratory failure, unspecified whether with hypoxia or hypercapnia: Secondary | ICD-10-CM | POA: Diagnosis present

## 2014-04-06 DIAGNOSIS — Z833 Family history of diabetes mellitus: Secondary | ICD-10-CM | POA: Diagnosis not present

## 2014-04-06 DIAGNOSIS — Z951 Presence of aortocoronary bypass graft: Secondary | ICD-10-CM | POA: Diagnosis not present

## 2014-04-06 DIAGNOSIS — I4729 Other ventricular tachycardia: Secondary | ICD-10-CM | POA: Diagnosis not present

## 2014-04-06 DIAGNOSIS — D649 Anemia, unspecified: Secondary | ICD-10-CM | POA: Diagnosis present

## 2014-04-06 DIAGNOSIS — R6520 Severe sepsis without septic shock: Secondary | ICD-10-CM

## 2014-04-06 DIAGNOSIS — Z8249 Family history of ischemic heart disease and other diseases of the circulatory system: Secondary | ICD-10-CM

## 2014-04-06 DIAGNOSIS — N183 Chronic kidney disease, stage 3 unspecified: Secondary | ICD-10-CM | POA: Diagnosis present

## 2014-04-06 DIAGNOSIS — K275 Chronic or unspecified peptic ulcer, site unspecified, with perforation: Secondary | ICD-10-CM | POA: Diagnosis present

## 2014-04-06 DIAGNOSIS — I219 Acute myocardial infarction, unspecified: Secondary | ICD-10-CM | POA: Diagnosis not present

## 2014-04-06 DIAGNOSIS — R578 Other shock: Secondary | ICD-10-CM | POA: Diagnosis present

## 2014-04-06 DIAGNOSIS — A419 Sepsis, unspecified organism: Secondary | ICD-10-CM | POA: Diagnosis not present

## 2014-04-06 DIAGNOSIS — R1013 Epigastric pain: Secondary | ICD-10-CM | POA: Diagnosis present

## 2014-04-06 DIAGNOSIS — Z66 Do not resuscitate: Secondary | ICD-10-CM | POA: Diagnosis present

## 2014-04-06 DIAGNOSIS — E870 Hyperosmolality and hypernatremia: Secondary | ICD-10-CM | POA: Diagnosis present

## 2014-04-06 DIAGNOSIS — E861 Hypovolemia: Secondary | ICD-10-CM | POA: Diagnosis present

## 2014-04-06 DIAGNOSIS — I469 Cardiac arrest, cause unspecified: Secondary | ICD-10-CM

## 2014-04-06 DIAGNOSIS — N1330 Unspecified hydronephrosis: Secondary | ICD-10-CM | POA: Diagnosis present

## 2014-04-06 DIAGNOSIS — M069 Rheumatoid arthritis, unspecified: Secondary | ICD-10-CM | POA: Diagnosis present

## 2014-04-06 DIAGNOSIS — I10 Essential (primary) hypertension: Secondary | ICD-10-CM

## 2014-04-06 DIAGNOSIS — E1169 Type 2 diabetes mellitus with other specified complication: Secondary | ICD-10-CM | POA: Diagnosis present

## 2014-04-06 DIAGNOSIS — I2510 Atherosclerotic heart disease of native coronary artery without angina pectoris: Secondary | ICD-10-CM | POA: Diagnosis present

## 2014-04-06 DIAGNOSIS — M1990 Unspecified osteoarthritis, unspecified site: Secondary | ICD-10-CM | POA: Diagnosis present

## 2014-04-06 DIAGNOSIS — E782 Mixed hyperlipidemia: Secondary | ICD-10-CM | POA: Diagnosis present

## 2014-04-06 DIAGNOSIS — I428 Other cardiomyopathies: Secondary | ICD-10-CM | POA: Diagnosis present

## 2014-04-06 DIAGNOSIS — E1129 Type 2 diabetes mellitus with other diabetic kidney complication: Secondary | ICD-10-CM | POA: Diagnosis present

## 2014-04-06 DIAGNOSIS — I252 Old myocardial infarction: Secondary | ICD-10-CM | POA: Diagnosis not present

## 2014-04-06 DIAGNOSIS — E41 Nutritional marasmus: Secondary | ICD-10-CM | POA: Diagnosis present

## 2014-04-06 DIAGNOSIS — E874 Mixed disorder of acid-base balance: Secondary | ICD-10-CM | POA: Diagnosis present

## 2014-04-06 DIAGNOSIS — E876 Hypokalemia: Secondary | ICD-10-CM | POA: Diagnosis present

## 2014-04-06 DIAGNOSIS — K659 Peritonitis, unspecified: Secondary | ICD-10-CM | POA: Diagnosis present

## 2014-04-06 DIAGNOSIS — I129 Hypertensive chronic kidney disease with stage 1 through stage 4 chronic kidney disease, or unspecified chronic kidney disease: Secondary | ICD-10-CM | POA: Diagnosis present

## 2014-04-06 DIAGNOSIS — N179 Acute kidney failure, unspecified: Secondary | ICD-10-CM | POA: Diagnosis present

## 2014-04-06 DIAGNOSIS — I255 Ischemic cardiomyopathy: Secondary | ICD-10-CM

## 2014-04-06 HISTORY — PX: LAPAROTOMY: SHX154

## 2014-04-06 HISTORY — PX: CENTRAL VENOUS CATHETER INSERTION: SHX401

## 2014-04-06 LAB — CBC WITH DIFFERENTIAL/PLATELET
BASOS PCT: 0 % (ref 0–1)
Basophils Absolute: 0 10*3/uL (ref 0.0–0.1)
Eosinophils Absolute: 0 10*3/uL (ref 0.0–0.7)
Eosinophils Relative: 0 % (ref 0–5)
HEMATOCRIT: 42.1 % (ref 39.0–52.0)
HEMOGLOBIN: 14.1 g/dL (ref 13.0–17.0)
LYMPHS ABS: 1 10*3/uL (ref 0.7–4.0)
Lymphocytes Relative: 7 % — ABNORMAL LOW (ref 12–46)
MCH: 28.5 pg (ref 26.0–34.0)
MCHC: 33.5 g/dL (ref 30.0–36.0)
MCV: 85.2 fL (ref 78.0–100.0)
MONO ABS: 1.4 10*3/uL — AB (ref 0.1–1.0)
MONOS PCT: 9 % (ref 3–12)
NEUTROS ABS: 13.2 10*3/uL — AB (ref 1.7–7.7)
Neutrophils Relative %: 84 % — ABNORMAL HIGH (ref 43–77)
Platelets: 189 10*3/uL (ref 150–400)
RBC: 4.94 MIL/uL (ref 4.22–5.81)
RDW: 16 % — ABNORMAL HIGH (ref 11.5–15.5)
WBC: 15.7 10*3/uL — ABNORMAL HIGH (ref 4.0–10.5)

## 2014-04-06 LAB — URINALYSIS, ROUTINE W REFLEX MICROSCOPIC
Glucose, UA: NEGATIVE mg/dL
NITRITE: NEGATIVE
PROTEIN: 100 mg/dL — AB
Specific Gravity, Urine: 1.02 (ref 1.005–1.030)
Urobilinogen, UA: 0.2 mg/dL (ref 0.0–1.0)
pH: 6 (ref 5.0–8.0)

## 2014-04-06 LAB — COMPREHENSIVE METABOLIC PANEL
ALT: 13 U/L (ref 0–53)
AST: 21 U/L (ref 0–37)
Albumin: 3.8 g/dL (ref 3.5–5.2)
Alkaline Phosphatase: 61 U/L (ref 39–117)
BILIRUBIN TOTAL: 0.6 mg/dL (ref 0.3–1.2)
BUN: 26 mg/dL — AB (ref 6–23)
CHLORIDE: 102 meq/L (ref 96–112)
CO2: 15 meq/L — AB (ref 19–32)
CREATININE: 3.29 mg/dL — AB (ref 0.50–1.35)
Calcium: 9.2 mg/dL (ref 8.4–10.5)
GFR, EST AFRICAN AMERICAN: 19 mL/min — AB (ref >90)
GFR, EST NON AFRICAN AMERICAN: 16 mL/min — AB (ref >90)
GLUCOSE: 212 mg/dL — AB (ref 70–99)
Potassium: 5 mEq/L (ref 3.7–5.3)
Sodium: 138 mEq/L (ref 137–147)
Total Protein: 6.4 g/dL (ref 6.0–8.3)

## 2014-04-06 LAB — URINE MICROSCOPIC-ADD ON

## 2014-04-06 LAB — LACTIC ACID, PLASMA: Lactic Acid, Venous: 2.9 mmol/L — ABNORMAL HIGH (ref 0.5–2.2)

## 2014-04-06 LAB — TROPONIN I

## 2014-04-06 LAB — MRSA PCR SCREENING: MRSA BY PCR: NEGATIVE

## 2014-04-06 LAB — LIPASE, BLOOD: LIPASE: 81 U/L — AB (ref 11–59)

## 2014-04-06 SURGERY — LAPAROTOMY, EXPLORATORY
Anesthesia: General | Site: Chest

## 2014-04-06 MED ORDER — MIDAZOLAM HCL 2 MG/2ML IJ SOLN
INTRAMUSCULAR | Status: AC
  Filled 2014-04-06: qty 2

## 2014-04-06 MED ORDER — ONDANSETRON HCL 4 MG/2ML IJ SOLN: 4.0000 mg | Freq: Four times a day (QID) | INTRAMUSCULAR | Status: DC | PRN

## 2014-04-06 MED ORDER — PANTOPRAZOLE SODIUM 40 MG IV SOLR
40.0000 mg | Freq: Once | INTRAVENOUS | Status: AC
  Administered 2014-04-06: 40 mg via INTRAVENOUS
  Filled 2014-04-06: qty 40

## 2014-04-06 MED ORDER — PIPERACILLIN-TAZOBACTAM 3.375 G IVPB 30 MIN
3.3750 g | Freq: Once | INTRAVENOUS | Status: AC
  Administered 2014-04-06: 3.375 g via INTRAVENOUS
  Filled 2014-04-06: qty 50

## 2014-04-06 MED ORDER — ROCURONIUM BROMIDE 50 MG/5ML IV SOLN
INTRAVENOUS | Status: AC
  Filled 2014-04-06: qty 1

## 2014-04-06 MED ORDER — IOHEXOL 300 MG/ML  SOLN
50.0000 mL | Freq: Once | INTRAMUSCULAR | Status: AC | PRN
  Administered 2014-04-06: 50 mL via ORAL

## 2014-04-06 MED ORDER — FENTANYL CITRATE 0.05 MG/ML IJ SOLN
INTRAMUSCULAR | Status: DC | PRN
  Administered 2014-04-06: 100 ug via INTRAVENOUS
  Administered 2014-04-06: 50 ug via INTRAVENOUS
  Administered 2014-04-06: 25 ug via INTRAVENOUS

## 2014-04-06 MED ORDER — ONDANSETRON HCL 4 MG/2ML IJ SOLN
4.0000 mg | Freq: Once | INTRAMUSCULAR | Status: AC
  Administered 2014-04-06: 4 mg via INTRAVENOUS

## 2014-04-06 MED ORDER — SODIUM CHLORIDE 0.9 % IR SOLN
Status: DC | PRN
  Administered 2014-04-06: 1000 mL
  Administered 2014-04-06: 500 mL
  Administered 2014-04-06 (×3): 1000 mL
  Administered 2014-04-06: 500 mL
  Administered 2014-04-06 (×2): 1000 mL

## 2014-04-06 MED ORDER — MORPHINE SULFATE 4 MG/ML IJ SOLN
4.0000 mg | Freq: Once | INTRAMUSCULAR | Status: AC
  Administered 2014-04-06: 2 mg via INTRAVENOUS
  Filled 2014-04-06: qty 1

## 2014-04-06 MED ORDER — POVIDONE-IODINE 10 % EX OINT
TOPICAL_OINTMENT | CUTANEOUS | Status: AC
  Filled 2014-04-06: qty 2

## 2014-04-06 MED ORDER — PANTOPRAZOLE SODIUM 40 MG IV SOLR
40.0000 mg | Freq: Every day | INTRAVENOUS | Status: DC
  Administered 2014-04-06 – 2014-04-11 (×6): 40 mg via INTRAVENOUS
  Filled 2014-04-06 (×6): qty 40

## 2014-04-06 MED ORDER — FENTANYL CITRATE 0.05 MG/ML IJ SOLN
INTRAMUSCULAR | Status: AC
  Filled 2014-04-06: qty 5

## 2014-04-06 MED ORDER — ETOMIDATE 2 MG/ML IV SOLN
INTRAVENOUS | Status: AC
  Filled 2014-04-06: qty 10

## 2014-04-06 MED ORDER — SODIUM CHLORIDE 0.9 % IV SOLN
Freq: Once | INTRAVENOUS | Status: AC
  Administered 2014-04-06: 10:00:00 via INTRAVENOUS

## 2014-04-06 MED ORDER — CETYLPYRIDINIUM CHLORIDE 0.05 % MT LIQD
7.0000 mL | Freq: Two times a day (BID) | OROMUCOSAL | Status: DC
  Administered 2014-04-06 – 2014-04-11 (×11): 7 mL via OROMUCOSAL

## 2014-04-06 MED ORDER — PIPERACILLIN-TAZOBACTAM 3.375 G IVPB
3.3750 g | Freq: Three times a day (TID) | INTRAVENOUS | Status: DC
  Filled 2014-04-06 (×3): qty 50

## 2014-04-06 MED ORDER — EPHEDRINE SULFATE 50 MG/ML IJ SOLN
INTRAMUSCULAR | Status: DC | PRN
  Administered 2014-04-06: 5 mg via INTRAVENOUS

## 2014-04-06 MED ORDER — ROCURONIUM BROMIDE 100 MG/10ML IV SOLN
INTRAVENOUS | Status: DC | PRN
  Administered 2014-04-06: 20 mg via INTRAVENOUS

## 2014-04-06 MED ORDER — ENOXAPARIN SODIUM 40 MG/0.4ML ~~LOC~~ SOLN
40.0000 mg | Freq: Once | SUBCUTANEOUS | Status: AC
  Administered 2014-04-06: 40 mg via SUBCUTANEOUS

## 2014-04-06 MED ORDER — PIPERACILLIN-TAZOBACTAM IN DEX 2-0.25 GM/50ML IV SOLN
2.2500 g | Freq: Three times a day (TID) | INTRAVENOUS | Status: DC
  Administered 2014-04-06 – 2014-04-09 (×8): 2.25 g via INTRAVENOUS
  Filled 2014-04-06 (×9): qty 50

## 2014-04-06 MED ORDER — LACTATED RINGERS IV SOLN
INTRAVENOUS | Status: DC
  Administered 2014-04-06 – 2014-04-07 (×4): via INTRAVENOUS
  Administered 2014-04-08: 1000 mL via INTRAVENOUS

## 2014-04-06 MED ORDER — POVIDONE-IODINE 10 % OINT PACKET
TOPICAL_OINTMENT | CUTANEOUS | Status: DC | PRN
  Administered 2014-04-06: 2 via TOPICAL

## 2014-04-06 MED ORDER — SODIUM CHLORIDE 0.9 % IV SOLN
INTRAVENOUS | Status: DC | PRN
  Administered 2014-04-06 (×3): via INTRAVENOUS

## 2014-04-06 MED ORDER — ONDANSETRON HCL 4 MG/2ML IJ SOLN
INTRAMUSCULAR | Status: AC
  Filled 2014-04-06: qty 2

## 2014-04-06 MED ORDER — SODIUM CHLORIDE 0.9 % IV BOLUS (SEPSIS)
1000.0000 mL | Freq: Once | INTRAVENOUS | Status: AC
  Administered 2014-04-06: 1000 mL via INTRAVENOUS

## 2014-04-06 MED ORDER — SUCCINYLCHOLINE CHLORIDE 20 MG/ML IJ SOLN
INTRAMUSCULAR | Status: DC | PRN
  Administered 2014-04-06: 100 mg via INTRAVENOUS

## 2014-04-06 MED ORDER — MORPHINE SULFATE 4 MG/ML IJ SOLN
4.0000 mg | Freq: Once | INTRAMUSCULAR | Status: AC
  Administered 2014-04-06: 4 mg via INTRAVENOUS
  Filled 2014-04-06: qty 1

## 2014-04-06 MED ORDER — LACTATED RINGERS IV SOLN
INTRAVENOUS | Status: DC
  Administered 2014-04-06: 14:00:00 via INTRAVENOUS

## 2014-04-06 MED ORDER — FENTANYL CITRATE 0.05 MG/ML IJ SOLN: 25.0000 ug | INTRAMUSCULAR | Status: DC | PRN

## 2014-04-06 MED ORDER — PHENYLEPHRINE HCL 10 MG/ML IJ SOLN
INTRAMUSCULAR | Status: DC | PRN
  Administered 2014-04-06 (×3): 100 ug via INTRAVENOUS

## 2014-04-06 MED ORDER — HEPARIN SODIUM (PORCINE) 5000 UNIT/ML IJ SOLN
5000.0000 [IU] | Freq: Three times a day (TID) | INTRAMUSCULAR | Status: DC
Start: 2014-04-07 — End: 2014-04-07
  Administered 2014-04-07 (×2): 5000 [IU] via SUBCUTANEOUS
  Filled 2014-04-06 (×2): qty 1

## 2014-04-06 MED ORDER — ETOMIDATE 2 MG/ML IV SOLN
INTRAVENOUS | Status: DC | PRN
  Administered 2014-04-06: 10 mg via INTRAVENOUS

## 2014-04-06 MED ORDER — GLYCOPYRROLATE 0.2 MG/ML IJ SOLN
INTRAMUSCULAR | Status: DC | PRN
  Administered 2014-04-06: 0.2 mg via INTRAVENOUS

## 2014-04-06 MED ORDER — SODIUM CHLORIDE 0.9 % IJ SOLN
INTRAMUSCULAR | Status: AC
Start: 2014-04-06 — End: 2014-04-06
  Filled 2014-04-06: qty 30

## 2014-04-06 MED ORDER — NEOSTIGMINE METHYLSULFATE 10 MG/10ML IV SOLN
INTRAVENOUS | Status: DC | PRN
  Administered 2014-04-06: 1 mg via INTRAVENOUS

## 2014-04-06 MED ORDER — ENOXAPARIN SODIUM 40 MG/0.4ML ~~LOC~~ SOLN
SUBCUTANEOUS | Status: AC
  Filled 2014-04-06: qty 0.4

## 2014-04-06 MED ORDER — MORPHINE SULFATE 2 MG/ML IJ SOLN
2.0000 mg | INTRAMUSCULAR | Status: DC | PRN
  Administered 2014-04-06 – 2014-04-13 (×15): 2 mg via INTRAVENOUS
  Filled 2014-04-06 (×17): qty 1

## 2014-04-06 MED ORDER — ONDANSETRON HCL 4 MG PO TABS: 4.0000 mg | ORAL_TABLET | Freq: Four times a day (QID) | ORAL | Status: DC | PRN

## 2014-04-06 MED ORDER — ONDANSETRON HCL 4 MG/2ML IJ SOLN: 4.0000 mg | Freq: Once | INTRAMUSCULAR | Status: DC | PRN

## 2014-04-06 MED ORDER — MIDAZOLAM HCL 5 MG/5ML IJ SOLN
INTRAMUSCULAR | Status: DC | PRN
  Administered 2014-04-06 (×2): 1 mg via INTRAVENOUS

## 2014-04-06 MED ORDER — LORAZEPAM 2 MG/ML IJ SOLN
1.0000 mg | INTRAMUSCULAR | Status: DC | PRN
  Administered 2014-04-09 – 2014-04-10 (×3): 1 mg via INTRAVENOUS
  Filled 2014-04-06 (×3): qty 1

## 2014-04-06 MED ORDER — SODIUM CHLORIDE 0.9 % IJ SOLN
INTRAMUSCULAR | Status: AC
  Filled 2014-04-06: qty 250

## 2014-04-06 MED ORDER — IOHEXOL 300 MG/ML  SOLN: 100.0000 mL | Freq: Once | INTRAMUSCULAR | Status: DC | PRN

## 2014-04-06 MED ORDER — SUCCINYLCHOLINE CHLORIDE 20 MG/ML IJ SOLN
INTRAMUSCULAR | Status: AC
  Filled 2014-04-06: qty 1

## 2014-04-06 MED ORDER — MIDAZOLAM HCL 2 MG/2ML IJ SOLN
1.0000 mg | INTRAMUSCULAR | Status: DC | PRN
  Administered 2014-04-06: 2 mg via INTRAVENOUS

## 2014-04-06 SURGICAL SUPPLY — 71 items
APPLIER CLIP 11 MED OPEN (CLIP)
APPLIER CLIP 13 LRG OPEN (CLIP)
APR CLP LRG 13 20 CLIP (CLIP)
APR CLP MED 11 20 MLT OPN (CLIP)
BAG HAMPER (MISCELLANEOUS) ×4 IMPLANT
BARRIER SKIN 2 3/4 (OSTOMY) IMPLANT
BARRIER SKIN 2 3/4 INCH (OSTOMY)
BARRIER SKIN OD2.25 2 3/4 FLNG (OSTOMY) IMPLANT
BRR SKN FLT 2.75X2.25 2 PC (OSTOMY)
CHLORAPREP W/TINT 26ML (MISCELLANEOUS) ×4 IMPLANT
CLAMP POUCH DRAINAGE QUIET (OSTOMY) IMPLANT
CLIP APPLIE 11 MED OPEN (CLIP) IMPLANT
CLIP APPLIE 13 LRG OPEN (CLIP) IMPLANT
CLOTH BEACON ORANGE TIMEOUT ST (SAFETY) ×4 IMPLANT
COVER LIGHT HANDLE STERIS (MISCELLANEOUS) ×8 IMPLANT
DRAPE WARM FLUID 44X44 (DRAPE) ×4 IMPLANT
DRSG OPSITE POSTOP 4X10 (GAUZE/BANDAGES/DRESSINGS) ×2 IMPLANT
DRSG TEGADERM 4X4.75 (GAUZE/BANDAGES/DRESSINGS) ×2 IMPLANT
ELECT BLADE 6 FLAT ULTRCLN (ELECTRODE) ×2 IMPLANT
ELECT REM PT RETURN 9FT ADLT (ELECTROSURGICAL) ×4
ELECTRODE REM PT RTRN 9FT ADLT (ELECTROSURGICAL) ×2 IMPLANT
EVACUATOR DRAINAGE 10X20 100CC (DRAIN) IMPLANT
EVACUATOR SILICONE 100CC (DRAIN) ×4
GAUZE SPONGE 4X4 12PLY STRL (GAUZE/BANDAGES/DRESSINGS) ×3 IMPLANT
GLOVE BIOGEL M 7.0 STRL (GLOVE) ×4 IMPLANT
GLOVE BIOGEL PI IND STRL 7.0 (GLOVE) IMPLANT
GLOVE BIOGEL PI INDICATOR 7.0 (GLOVE) ×4
GLOVE ECLIPSE 6.5 STRL STRAW (GLOVE) ×2 IMPLANT
GLOVE EXAM NITRILE MD LF STRL (GLOVE) ×2 IMPLANT
GLOVE SURG SS PI 7.5 STRL IVOR (GLOVE) ×8 IMPLANT
GOWN STRL REUS W/TWL LRG LVL3 (GOWN DISPOSABLE) ×12 IMPLANT
INST SET MAJOR GENERAL (KITS) ×4 IMPLANT
KIT REMOVER STAPLE SKIN (MISCELLANEOUS) IMPLANT
KIT ROOM TURNOVER APOR (KITS) ×4 IMPLANT
LIGASURE IMPACT 36 18CM CVD LR (INSTRUMENTS) ×2 IMPLANT
MANIFOLD NEPTUNE II (INSTRUMENTS) ×4 IMPLANT
NS IRRIG 1000ML POUR BTL (IV SOLUTION) ×14 IMPLANT
NS IRRIG 500ML POUR BTL (IV SOLUTION) ×4 IMPLANT
PACK ABDOMINAL MAJOR (CUSTOM PROCEDURE TRAY) ×4 IMPLANT
PAD ARMBOARD 7.5X6 YLW CONV (MISCELLANEOUS) ×4 IMPLANT
POUCH OSTOMY 2 3/4  H 3804 (WOUND CARE)
POUCH OSTOMY 2 3/4 H 3804 (WOUND CARE)
POUCH OSTOMY 2 PC DRNBL 2.75 (WOUND CARE) IMPLANT
RELOAD LINEAR CUT PROX 55 BLUE (ENDOMECHANICALS) IMPLANT
RELOAD PROXIMATE 75MM BLUE (ENDOMECHANICALS) IMPLANT
RELOAD STAPLE 55 3.8 BLU REG (ENDOMECHANICALS) IMPLANT
RELOAD STAPLE 75 3.8 BLU REG (ENDOMECHANICALS) IMPLANT
RETRACTOR WND ALEXIS 25 LRG (MISCELLANEOUS) IMPLANT
RTRCTR WOUND ALEXIS 25CM LRG (MISCELLANEOUS)
SET BASIN LINEN APH (SET/KITS/TRAYS/PACK) ×4 IMPLANT
SPONGE DRAIN TRACH 4X4 STRL 2S (GAUZE/BANDAGES/DRESSINGS) ×2 IMPLANT
SPONGE GAUZE 4X4 12PLY (GAUZE/BANDAGES/DRESSINGS) ×2 IMPLANT
SPONGE LAP 18X18 X RAY DECT (DISPOSABLE) ×6 IMPLANT
STAPLER GUN LINEAR PROX 60 (STAPLE) IMPLANT
STAPLER PROXIMATE 55 BLUE (STAPLE) IMPLANT
STAPLER PROXIMATE 75MM BLUE (STAPLE) IMPLANT
STAPLER VISISTAT (STAPLE) ×4 IMPLANT
SUCTION POOLE TIP (SUCTIONS) ×4 IMPLANT
SUT CHROMIC 0 SH (SUTURE) IMPLANT
SUT CHROMIC 2 0 SH (SUTURE) IMPLANT
SUT CHROMIC 3 0 SH 27 (SUTURE) ×2 IMPLANT
SUT ETHILON 3 0 FSL (SUTURE) ×2 IMPLANT
SUT NOVA NAB GS-26 0 60 (SUTURE) ×4 IMPLANT
SUT PDS AB CT VIOLET #0 27IN (SUTURE) ×4 IMPLANT
SUT PROLENE 2 0 SH 30 (SUTURE) ×2 IMPLANT
SUT SILK 2 0 (SUTURE) ×4
SUT SILK 2 0 REEL (SUTURE) ×2 IMPLANT
SUT SILK 2-0 18XBRD TIE 12 (SUTURE) ×2 IMPLANT
SUT SILK 3 0 SH CR/8 (SUTURE) ×2 IMPLANT
TAPE CLOTH SURG 4X10 WHT LF (GAUZE/BANDAGES/DRESSINGS) ×2 IMPLANT
TRAY FOLEY CATH 16FR SILVER (SET/KITS/TRAYS/PACK) ×2 IMPLANT

## 2014-04-06 NOTE — Anesthesia Postprocedure Evaluation (Signed)
  Anesthesia Post-op Note  Patient: [name]  Procedure(s) Performed: Procedure(s): EXPLORATORY LAPAROTOMY, GRAHAM PLICATION (N/A) INSERTION CENTRAL LINE ADULT (Left)  Patient Location: PACU  Anesthesia Type:General  Level of Consciousness: awake and patient cooperative  Airway and Oxygen Therapy: Patient Spontanous Breathing and Patient connected to face mask oxygen  Post-op Pain: 4 /10, mild  Post-op Assessment: Post-op Vital signs reviewed, Patient's Cardiovascular Status Stable, Respiratory Function Stable, Patent Airway, No signs of Nausea or vomiting and Pain level controlled  Post-op Vital Signs: Reviewed and stable  Last Vitals:  Filed Vitals:   04/06/14 1300  BP: 110/49  Pulse: 88  Temp:   Resp: 31    Complications: No apparent anesthesia complications

## 2014-04-06 NOTE — ED Provider Notes (Signed)
Ct shows ruptured viscous.  Dr. Lovell Sheehan to see pt.    CRITICAL CARE Performed by: Samir Ishaq L Total critical care time: 40 Critical care time was exclusive of separately billable procedures and treating other patients. Critical care was necessary to treat or prevent imminent or life-threatening deterioration. Critical care was time spent personally by me on the following activities: development of treatment plan with patient and/or surrogate as well as nursing, discussions with consultants, evaluation of patient's response to treatment, examination of patient, obtaining history from patient or surrogate, ordering and performing treatments and interventions, ordering and review of laboratory studies, ordering and review of radiographic studies, pulse oximetry and re-evaluation of patient's condition.   Benny Lennert, MD 04/06/14 [phone]

## 2014-04-06 NOTE — Anesthesia Procedure Notes (Signed)
Procedure Name: Intubation Date/Time: 04/06/2014 11:19 AM Performed by: Despina Hidden Pre-anesthesia Checklist: Suction available, Patient being monitored, Emergency Drugs available and Patient identified Patient Re-evaluated:Patient Re-evaluated prior to inductionOxygen Delivery Method: Circle system utilized Preoxygenation: Pre-oxygenation with 100% oxygen Intubation Type: IV induction, Rapid sequence and Cricoid Pressure applied Ventilation: Mask ventilation without difficulty and Oral airway inserted - appropriate to patient size Laryngoscope Size: Mac and 3 Grade View: Grade I Tube type: Oral Tube size: 8.0 mm Number of attempts: 1 Airway Equipment and Method: Stylet and Oral airway Placement Confirmation: ETT inserted through vocal cords under direct vision,  breath sounds checked- equal and bilateral and positive ETCO2 Secured at: 22 cm Tube secured with: Tape Dental Injury: Teeth and Oropharynx as per pre-operative assessment

## 2014-04-06 NOTE — Progress Notes (Signed)
ANTIBIOTIC CONSULT NOTE - INITIAL  Pharmacy Consult for Zosyn Indication: intra-abdominal infection, peritonitis  No Known Allergies  Patient Measurements: Height: 5' 6.5" (168.9 cm) Weight: 115 lb (52.164 kg) IBW/kg (Calculated) : 64.95  Vital Signs: Temp: 98.2 F (36.8 C) (09/01 1430) Temp src: Oral (09/01 1049) BP: 95/37 mmHg (09/01 1430) Pulse Rate: 82 (09/01 1430) Intake/Output from previous day:   Intake/Output from this shift: Total I/O In: 2100 [I.V.:2100] Out: 205 [Urine:75; Drains:30; Other:100]  Labs:  Recent Labs  04/06/14 0653  WBC 15.7*  HGB 14.1  PLT 189  CREATININE 3.29*   Estimated Creatinine Clearance: 13.4 ml/min (by C-G formula based on Cr of 3.29). No results found for this basename: VANCOTROUGH, VANCOPEAK, VANCORANDOM, GENTTROUGH, GENTPEAK, GENTRANDOM, TOBRATROUGH, TOBRAPEAK, TOBRARND, AMIKACINPEAK, AMIKACINTROU, AMIKACIN,  in the last 72 hours   Microbiology: No results found for this or any previous visit (from the past 720 hour(s)).  Medical History: Past Medical History  Diagnosis Date  . Coronary atherosclerosis of native coronary artery     Multivessel  . Essential hypertension, benign   . Mixed hyperlipidemia   . Erectile dysfunction   . Osteoarthritis   . Myocardial infarction     IMI 1987 and PLMI 1997  . Rheumatoid arthritis, adult    Zosyn 9/1 >>  Anti-infectives   Start     Dose/Rate Route Frequency Ordered Stop   04/06/14 1800  piperacillin-tazobactam (ZOSYN) IVPB 2.25 g     2.25 g 100 mL/hr over 30 Minutes Intravenous Every 8 hours 04/06/14 1458     04/06/14 1600  piperacillin-tazobactam (ZOSYN) IVPB 3.375 g  Status:  Discontinued     3.375 g 12.5 mL/hr over 240 Minutes Intravenous Every 8 hours 04/06/14 1438 04/06/14 1453   04/06/14 0930  piperacillin-tazobactam (ZOSYN) IVPB 3.375 g     3.375 g 100 mL/hr over 30 Minutes Intravenous  Once 04/06/14 5027 04/06/14 1036      Assessment: 80yo male admitted with c/o  severe abdominal pain.  Asked to initiate Zosyn for peritonitis.  SCr is elevated.  Normalized ClCr 13-24ml/min  Goal of Therapy:  Eradicate infection.  Plan:  Zosyn 2.25gm IV q8h (renally adjusted) Monitor labs, renal fxn, progress, and cultures  Margo Aye, Blu Lori A 04/06/2014,2:58 PM

## 2014-04-06 NOTE — Op Note (Signed)
Patient:  TALAL FRITCHMAN  DOB:  04/22/1933  MRN:  [id number]   Preop Diagnosis:  Perforated viscus, peritonitis  Postop Diagnosis:  Same, perforated peptic ulcer  Procedure:  Exploratory laparotomy, Cheree Ditto plication, central line placement  Surgeon:  Franky Macho, M.D.  Anes:  General endotracheal  Indications:  Patient is a 80 year old white male who presented emergency room with worsening abdominal pain. CT scan the abdomen revealed peritonitis with pneumoperitoneum. After resuscitation in the emergency room, the patient now presents for exploratory laparotomy as well as central line placement. The risks and benefits of the procedure including bleeding, infection, cardiopulmonary difficulties, and the possibility of a colostomy were fully explained to the patient, who gave informed consent.  Procedure note:  The patient was placed in the supine position. After induction of general endotracheal anesthesia, the abdomen was prepped and draped using usual sterile technique with DuraPrep. Surgical site confirmation was performed.  An upper midline incision was made down to the fascia. The peritoneal cavity was entered into without difficulty. A significant amount of gastric contents were noted in the abdominal cavity. This was evacuated. On exploration, the patient had an anterior perforation of the first portion of the duodenum. The defect was almost 1 cm in its greatest diameter. The liver and the rest of the stomach were within normal limits.  Using the LigaSure, surrounding omentum was mobilized and secured over the perforation in a Graham plication using 3-0 silk sutures. The abdominal cavity was then copiously irrigated with greater than 5 L of normal saline. A #10 flat Jackson-Pratt drain was placed into the region over the repair. It was secured at the skin level using 3-0 nylon interrupted suture. NG tube placement was confirmed by palpation. The abdomen was then closed using 0 PDS running  sutures. The skin was closed using staples. Betadine ointment and a dry sterile dressing were applied.  Next, a left subclavian triple-lumen central line was placed using the Seldinger technique without difficulty. Good backflow was noted from all 3 ports. All 3 ports were flushed with saline. The central line was secured in place using 3-0 nylon interrupted suture. A sterile dressing was then applied.  All tape and needle counts were correct at the end of the procedure. Patient was extubated in the operating room and transferred to PACU in guarded but stable condition.  Complications:  None  EBL:  Minimal  Specimen:  None  Drains: JP drain to plication area in right upper quadrant.

## 2014-04-06 NOTE — ED Notes (Signed)
Pt evaluated by Dr. Lovell Sheehan

## 2014-04-06 NOTE — ED Notes (Signed)
Pt to OR.pt stable. Family not here at present

## 2014-04-06 NOTE — Progress Notes (Signed)
Inpatient Diabetes Program Recommendations  AACE/ADA: New Consensus Statement on Inpatient Glycemic Control (2013)  Target Ranges:  Prepandial:   less than 140 mg/dL      Peak postprandial:   less than 180 mg/dL (1-2 hours)      Critically ill patients:  140 - 180 mg/dL   Results for [name], [name] (MRN [id number]) as of 04/06/2014 15:00  Ref. Range 04/06/2014 06:53  Glucose Latest Range: 70-99 mg/dL 662 (H)   Diabetes history: No Outpatient Diabetes medications: NA Current orders for Inpatient glycemic control: If CBG >140; call MD for Glycemic Control orders  Inpatient Diabetes Program Recommendations Correction (SSI): Please consider ordering CBGs with Novolog sensitive correction Q4H. HgbA1C: Please consider ordering an A1C to evaluate glycemic control over the past 2-3 months.  Thanks, Orlando Penner, RN, MSN, CCRN Diabetes Coordinator Inpatient Diabetes Program [phone] (Team Pager)  [phone] ([facility] office) [phone] [facility] office)

## 2014-04-06 NOTE — ED Notes (Signed)
Pt unable to obtain urine spec at this time.

## 2014-04-06 NOTE — ED Notes (Addendum)
Updated EDP on pt bp and pain medication administration. NS bolus given per EDP order. Pt asymptomic with sbp at 85, pain score 6 67f 10 on scale.

## 2014-04-06 NOTE — ED Notes (Signed)
Nurse called pt's daughter, Dot Lanes, to inform her her dad would be going to surgery

## 2014-04-06 NOTE — ED Notes (Signed)
Pt complain of abdominal pain 8/10

## 2014-04-06 NOTE — ED Provider Notes (Signed)
CSN: [id number]     Arrival date & time 04/06/14  0543 History   First MD Initiated Contact with Patient 04/06/14 [phone]     Chief Complaint  Patient presents with  . Abdominal Pain     (Consider location/radiation/quality/duration/timing/severity/associated sxs/prior Treatment) HPI Comments: Patient is a 80 year old male with history of coronary artery disease status post coronary artery bypass graft. He presents with complaints of severe abdominal pain for the past 10 hours. His pain has been worsening and is associated with nonbloody, nonbilious vomiting. He denies any bloody stool. He denies to me he is having any fevers or chills. He denies any urinary complaints.  Patient is a 80 y.o. male presenting with abdominal pain. The history is provided by the patient.  Abdominal Pain Pain location:  Generalized Pain quality: cramping   Pain radiates to:  Does not radiate Pain severity:  Severe Onset quality:  Sudden Duration:  10 hours Timing:  Constant Progression:  Worsening Chronicity:  New Relieved by:  Nothing Worsened by:  Nothing tried Ineffective treatments:  None tried   Past Medical History  Diagnosis Date  . Coronary atherosclerosis of native coronary artery     Multivessel  . Essential hypertension, benign   . Mixed hyperlipidemia   . Erectile dysfunction   . Osteoarthritis   . Myocardial infarction     IMI 1987 and PLMI 1997  . Rheumatoid arthritis, adult    Past Surgical History  Procedure Laterality Date  . Coronary artery bypass graft  1987  . Carpal tunnel release    . Rotator cuff repair    . Neck surgery    . Esophagogastroduodenoscopy N/A 01/23/2013    Procedure: ESOPHAGOGASTRODUODENOSCOPY (EGD);  Surgeon: Shirley Friar, MD;  Location: [facility] ENDOSCOPY;  Service: Endoscopy;  Laterality: N/A;  . Esophagogastroduodenoscopy N/A 01/23/2013    Procedure: ESOPHAGOGASTRODUODENOSCOPY (EGD);  Surgeon: Shirley Friar, MD;  Location: [facility] ENDOSCOPY;  Service:  Endoscopy;  Laterality: N/A;   Family History  Problem Relation Age of Onset  . Coronary artery disease Father     Died age 58 with MI  . Diabetes type II Sister    History  Substance Use Topics  . Smoking status: Former Smoker -- 0.25 packs/day for 60 years    Types: Cigarettes    Quit date: 07/14/2011  . Smokeless tobacco: Never Used     Comment: stopped 05-29-13  . Alcohol Use: No     Comment: one beeer every 6 months    Review of Systems  Gastrointestinal: Positive for abdominal pain.  All other systems reviewed and are negative.     Allergies  Review of patient's allergies indicates no known allergies.  Home Medications   Prior to Admission medications   Medication Sig Start Date End Date Taking? Authorizing Provider  atenolol (TENORMIN) 25 MG tablet Take 25 mg by mouth daily.     Historical Provider, MD  AVODART 0.5 MG capsule Take 0.5 mg by mouth daily.  06/19/13   Historical Provider, MD  citalopram (CELEXA) 10 MG tablet Take 20 mg by mouth at bedtime.  02/05/12 06/29/13  Christiane Ha, MD  DIOVAN 80 MG tablet Take 80 mg by mouth daily.  02/16/13   Historical Provider, MD  ezetimibe (ZETIA) 10 MG tablet Take 10 mg by mouth daily.      Historical Provider, MD  felodipine (PLENDIL) 5 MG 24 hr tablet Take 5 mg by mouth daily.     Historical Provider, MD  furosemide (LASIX) 20  MG tablet Take 1 tablet (20 mg total) by mouth daily. 01/05/13   Jonelle Sidle, MD  irbesartan (AVAPRO) 75 MG tablet Take 75 mg by mouth at bedtime.    Historical Provider, MD  nitroGLYCERIN (NITROSTAT) 0.4 MG SL tablet Place 1 tablet (0.4 mg total) under the tongue every 5 (five) minutes as needed for chest pain. 06/29/13   Jonelle Sidle, MD  oxyCODONE-acetaminophen (PERCOCET) 10-325 MG per tablet Take 1 tablet by mouth every 6 (six) hours as needed. For pain    Historical Provider, MD  potassium chloride (K-DUR) 10 MEQ tablet Take 1 tablet (10 mEq total) by mouth daily. 05/28/12   Kathlen Brunswick, MD  risperiDONE (RISPERDAL) 0.5 MG tablet Take 0.5 mg by mouth 2 (two) times daily. 02/05/12   Christiane Ha, MD  simvastatin (ZOCOR) 20 MG tablet Take 20 mg by mouth at bedtime.     Historical Provider, MD  tamsulosin (FLOMAX) 0.4 MG CAPS capsule Take 0.4 mg by mouth daily after supper.  06/19/13   Historical Provider, MD  Thiamine HCl (VITAMIN B-1) 250 MG tablet Take 250 mg by mouth daily.      Historical Provider, MD   BP 110/55  Pulse 66  Resp 16  Ht 5' 6.5" (1.689 m)  Wt 115 lb (52.164 kg)  BMI 18.29 kg/m2  SpO2 99% Physical Exam  Nursing note and vitals reviewed. Constitutional: He is oriented to person, place, and time. He appears well-developed and well-nourished. No distress.  HENT:  Head: Normocephalic and atraumatic.  Mouth/Throat: Oropharynx is clear and moist.  Neck: Normal range of motion. Neck supple.  Cardiovascular: Normal rate, regular rhythm and normal heart sounds.   No murmur heard. Pulmonary/Chest: Effort normal and breath sounds normal. No respiratory distress. He has no wheezes.  Abdominal: Soft. Bowel sounds are normal. He exhibits no distension. There is tenderness. There is no rebound and no guarding.  There is tenderness to palpation in all 4 quadrants. There is no rebound and no guarding.  Musculoskeletal: Normal range of motion. He exhibits no edema.  Neurological: He is alert and oriented to person, place, and time.  Skin: Skin is warm and dry. He is not diaphoretic.    ED Course  Procedures (including critical care time) Labs Review Labs Reviewed  CBC WITH DIFFERENTIAL  COMPREHENSIVE METABOLIC PANEL  LIPASE, BLOOD    Imaging Review No results found.   EKG Interpretation None      MDM   Final diagnoses:  None    Patient with history of coronary artery disease who presents for evaluation of generalized abdominal pain. This started yesterday evening. EMS was called and he was found to be hypotensive. Laboratory studies  are pending and he will undergo CT of the abdomen and pelvis to further explore the etiology of his symptoms and hypotension. Care will be signed out at shift change to Dr. Estell Harpin who will obtain the results of the CT scan and determine the final disposition.    Geoffery Lyons, MD 04/06/14 [phone]

## 2014-04-06 NOTE — ED Notes (Addendum)
Pt states having abdominal pain and N&V. Since last night to left and lower abdomen.

## 2014-04-06 NOTE — Transfer of Care (Signed)
Immediate Anesthesia Transfer of Care Note  Patient: [name]  Procedure(s) Performed: Procedure(s): EXPLORATORY LAPAROTOMY (N/A) INSERTION CENTRAL LINE ADULT (Left)  Patient Location: PACU  Anesthesia Type:General  Level of Consciousness: responds to stimulation  Airway & Oxygen Therapy: Patient Spontanous Breathing and Patient connected to face mask oxygen  Post-op Assessment: Report given to PACU RN and Post -op Vital signs reviewed and stable  Post vital signs: Reviewed and stable  Complications: No apparent anesthesia complications

## 2014-04-06 NOTE — H&P (Signed)
[name] is an 80 y.o. male.   Chief Complaint: Abdominal pain HPI: Patient is a 80 year old white male who presented emergency room with worsening abdominal pain. He states he's been feeling sick to his stomach for about a week. CT scan the abdomen was performed which revealed free air under the diaphragm and peritonitis.  Past Medical History  Diagnosis Date  . Coronary atherosclerosis of native coronary artery     Multivessel  . Essential hypertension, benign   . Mixed hyperlipidemia   . Erectile dysfunction   . Osteoarthritis   . Myocardial infarction     IMI 1987 and PLMI 1997  . Rheumatoid arthritis, adult     Past Surgical History  Procedure Laterality Date  . Coronary artery bypass graft  1987  . Carpal tunnel release    . Rotator cuff repair    . Neck surgery    . Esophagogastroduodenoscopy N/A 01/23/2013    Procedure: ESOPHAGOGASTRODUODENOSCOPY (EGD);  Surgeon: Lear Ng, MD;  Location: [facility] ENDOSCOPY;  Service: Endoscopy;  Laterality: N/A;  . Esophagogastroduodenoscopy N/A 01/23/2013    Procedure: ESOPHAGOGASTRODUODENOSCOPY (EGD);  Surgeon: Lear Ng, MD;  Location: [facility] ENDOSCOPY;  Service: Endoscopy;  Laterality: N/A;    Family History  Problem Relation Age of Onset  . Coronary artery disease Father     Died age 88 with MI  . Diabetes type II Sister    Social History:  reports that he quit smoking about 2 years ago. His smoking use included Cigarettes. He has a 15 pack-year smoking history. He has never used smokeless tobacco. He reports that he does not drink alcohol or use illicit drugs.  Allergies: No Known Allergies   (Not in a hospital admission)  Results for orders placed during the hospital encounter of 04/06/14 (from the past 48 hour(s))  CBC WITH DIFFERENTIAL     Status: Abnormal   Collection Time    04/06/14  6:53 AM      Result Value Ref Range   WBC 15.7 (*) 4.0 - 10.5 K/uL   RBC 4.94  4.22 - 5.81 MIL/uL   Hemoglobin 14.1   13.0 - 17.0 g/dL   HCT 42.1  39.0 - 52.0 %   MCV 85.2  78.0 - 100.0 fL   MCH 28.5  26.0 - 34.0 pg   MCHC 33.5  30.0 - 36.0 g/dL   RDW 16.0 (*) 11.5 - 15.5 %   Platelets 189  150 - 400 K/uL   Neutrophils Relative % 84 (*) 43 - 77 %   Neutro Abs 13.2 (*) 1.7 - 7.7 K/uL   Lymphocytes Relative 7 (*) 12 - 46 %   Lymphs Abs 1.0  0.7 - 4.0 K/uL   Monocytes Relative 9  3 - 12 %   Monocytes Absolute 1.4 (*) 0.1 - 1.0 K/uL   Eosinophils Relative 0  0 - 5 %   Eosinophils Absolute 0.0  0.0 - 0.7 K/uL   Basophils Relative 0  0 - 1 %   Basophils Absolute 0.0  0.0 - 0.1 K/uL  COMPREHENSIVE METABOLIC PANEL     Status: Abnormal   Collection Time    04/06/14  6:53 AM      Result Value Ref Range   Sodium 138  137 - 147 mEq/L   Potassium 5.0  3.7 - 5.3 mEq/L   Chloride 102  96 - 112 mEq/L   CO2 15 (*) 19 - 32 mEq/L   Glucose, Bld 212 (*) 70 -  99 mg/dL   BUN 26 (*) 6 - 23 mg/dL   Creatinine, Ser 3.29 (*) 0.50 - 1.35 mg/dL   Calcium 9.2  8.4 - 10.5 mg/dL   Total Protein 6.4  6.0 - 8.3 g/dL   Albumin 3.8  3.5 - 5.2 g/dL   AST 21  0 - 37 U/L   ALT 13  0 - 53 U/L   Alkaline Phosphatase 61  39 - 117 U/L   Total Bilirubin 0.6  0.3 - 1.2 mg/dL   GFR calc non Af Amer 16 (*) >90 mL/min   GFR calc Af Amer 19 (*) >90 mL/min   Comment: (NOTE)     The eGFR has been calculated using the CKD EPI equation.     This calculation has not been validated in all clinical situations.     eGFR's persistently <90 mL/min signify possible Chronic Kidney     Disease.  LIPASE, BLOOD     Status: Abnormal   Collection Time    04/06/14  6:53 AM      Result Value Ref Range   Lipase 81 (*) 11 - 59 U/L  LACTIC ACID, PLASMA     Status: Abnormal   Collection Time    04/06/14  7:19 AM      Result Value Ref Range   Lactic Acid, Venous 2.9 (*) 0.5 - 2.2 mmol/L  TROPONIN I     Status: None   Collection Time    04/06/14  7:19 AM      Result Value Ref Range   Troponin I <0.30  <0.30 ng/mL   Comment:            Due to the  release kinetics of cTnI,     a negative result within the first hours     of the onset of symptoms does not rule out     myocardial infarction with certainty.     If myocardial infarction is still suspected,     repeat the test at appropriate intervals.  URINALYSIS, ROUTINE W REFLEX MICROSCOPIC     Status: Abnormal   Collection Time    04/06/14 10:04 AM      Result Value Ref Range   Color, Urine YELLOW  YELLOW   APPearance CLEAR  CLEAR   Specific Gravity, Urine 1.020  1.005 - 1.030   pH 6.0  5.0 - 8.0   Glucose, UA NEGATIVE  NEGATIVE mg/dL   Hgb urine dipstick MODERATE (*) NEGATIVE   Bilirubin Urine MODERATE (*) NEGATIVE   Ketones, ur TRACE (*) NEGATIVE mg/dL   Protein, ur 100 (*) NEGATIVE mg/dL   Urobilinogen, UA 0.2  0.0 - 1.0 mg/dL   Nitrite NEGATIVE  NEGATIVE   Leukocytes, UA TRACE (*) NEGATIVE  URINE MICROSCOPIC-ADD ON     Status: Abnormal   Collection Time    04/06/14 10:04 AM      Result Value Ref Range   Squamous Epithelial / LPF RARE  RARE   WBC, UA 11-20  <3 WBC/hpf   Bacteria, UA MANY (*) RARE   Ct Abdomen Pelvis Wo Contrast  04/06/2014   CLINICAL DATA:  Abdominal pain, vomiting  EXAM: CT ABDOMEN AND PELVIS WITHOUT CONTRAST  TECHNIQUE: Multidetector CT imaging of the abdomen and pelvis was performed following the standard protocol without IV contrast.  COMPARISON:  None.  FINDINGS: Lung bases are unremarkable. Sagittal images of the spine shows levoscoliosis lumbar spine. Extensive degenerative changes lumbar spine.  Oral contrast material was given to the  patient. Unenhanced liver shows no biliary ductal dilatation. There is high density perihepatic ascites. High-density ascites is noted in right paracolic gutter. High-density ascites noted left paracolic gutter and within pelvis. This may be due to contrast material, hemorrhagic products or proteinaceous products. There is free air in upper abdomen in left upper quadrant and posterior to the stomach. Findings are  consistent with perforated viscus. There is abnormal thickening of duodenal wall in axial image 27. Findings are highly suspicious for perforated duodenum, stomach or colon. There is significant left hydronephrosis and significant left renal cortical thinning probable due to chronic obstructive uropathy. 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 There is a calcification in left upper abdomen measures 9 mm. Extensive atherosclerotic vascular calcifications. No aortic aneurysm. No small bowel obstruction. No colonic obstruction. Some stool noted within rectum. Prostate gland and seminal vesicles are unremarkable. No destructive bony lesions are noted within pelvis. Degenerative changes bilateral hip joints.  IMPRESSION: 1. There is high density ascites consistent with peritonitis. This may be due to oral contrast, hemorrhagic products or proteinaceous products. 2. There is free air in upper abdomen consistent with perforated viscus. Mild thickening of duodenal wall. Given high density ascites this may be due to duodenal perforation, gastric perforation or upper colonic perforation. 3. No small bowel obstruction. 4. There is probable chronic significant left hydronephrosis probable due to chronic UPJ obstruction. 5. Extensive degenerative changes lumbar spine. Critical Value/emergent results were called by telephone at the time of interpretation on 04/06/2014 at 9:19 am to Dr. Veryl Speak , who verbally acknowledged these results.   Electronically Signed   By: Lahoma Crocker M.D.   On: 04/06/2014 09:20   Dg Chest Portable 1 View  04/06/2014   CLINICAL DATA:  Abdominal pain.  EXAM: PORTABLE CHEST - 1 VIEW  COMPARISON:  None.  FINDINGS: Lungs volumes are low with mild atelectasis. No pneumothorax or pleural effusion. Heart size is normal.  IMPRESSION: No acute disease in a low volume chest.   Electronically Signed   By: Inge Rise M.D.   On: 04/06/2014 09:28    Review of Systems  Constitutional: Positive for  malaise/fatigue.  Respiratory: Negative.   Cardiovascular: Negative.   Gastrointestinal: Positive for nausea and abdominal pain.    Blood pressure 149/72, pulse 85, resp. rate 28, height 5' 6.5" (1.689 m), weight 52.164 kg (115 lb), SpO2 98.00%. Physical Exam  Vitals reviewed. Constitutional:  Cachectic white male in no acute distress.  Neck: Normal range of motion. Neck supple.  Cardiovascular: Normal rate, regular rhythm and normal heart sounds.   Respiratory: Effort normal and breath sounds normal.  GI: Soft. There is tenderness. There is rebound and guarding.  Neurological: He is alert.  Skin: Skin is warm and dry.     Assessment/Plan Impression: Perforated viscus, peritonitis Plan: Patient will be taken emergently to the operating room for an exploratory laparotomy and central line placement. The risks and benefits of the procedure including bleeding, infection, and the possibility of cardiopulmonary difficulties were fully explained to the patient, gave informed consent. He will be in the ICU postoperatively. His prognosis is guarded.   Tyrece Vanterpool A 04/06/2014, 10:32 AM

## 2014-04-06 NOTE — Anesthesia Preprocedure Evaluation (Addendum)
Anesthesia Evaluation  Patient identified by MRN, date of birth, ID band Patient awake    Reviewed: Allergy & Precautions, H&P , NPO status , Patient's Chart, lab work & pertinent test results, reviewed documented beta blocker date and time   Airway Mallampati: II TM Distance: >3 FB     Dental  (+) Edentulous Upper, Edentulous Lower   Pulmonary former smoker,  breath sounds clear to auscultation        Cardiovascular hypertension, Pt. on medications and Pt. on home beta blockers - angina+ CAD and + Past MI Rhythm:Regular Rate:Normal     Neuro/Psych PSYCHIATRIC DISORDERS Depression    GI/Hepatic Free air - perf viscous   Endo/Other    Renal/GU      Musculoskeletal  (+) Arthritis -,   Abdominal   Peds  Hematology   Anesthesia Other Findings   Reproductive/Obstetrics                          Anesthesia Physical Anesthesia Plan  ASA: III and emergent  Anesthesia Plan: General   Post-op Pain Management:    Induction: Intravenous, Rapid sequence and Cricoid pressure planned  Airway Management Planned: Oral ETT  Additional Equipment:   Intra-op Plan:   Post-operative Plan: Extubation in OR  Informed Consent: I have reviewed the patients History and Physical, chart, labs and discussed the procedure including the risks, benefits and alternatives for the proposed anesthesia with the patient or authorized representative who has indicated his/her understanding and acceptance.     Plan Discussed with:   Anesthesia Plan Comments: (amidate  induction)       Anesthesia Quick Evaluation

## 2014-04-07 ENCOUNTER — Encounter (HOSPITAL_COMMUNITY): Payer: Self-pay | Admitting: General Surgery

## 2014-04-07 LAB — PHOSPHORUS: Phosphorus: 4.7 mg/dL — ABNORMAL HIGH (ref 2.3–4.6)

## 2014-04-07 LAB — CBC
HCT: 39.7 % (ref 39.0–52.0)
HCT: 35.6 % — ABNORMAL LOW (ref 39.0–52.0)
Hemoglobin: 13.3 g/dL (ref 13.0–17.0)
Hemoglobin: 11.9 g/dL — ABNORMAL LOW (ref 13.0–17.0)
MCH: 28.9 pg (ref 26.0–34.0)
MCH: 28.5 pg (ref 26.0–34.0)
MCHC: 33.5 g/dL (ref 30.0–36.0)
MCHC: 33.4 g/dL (ref 30.0–36.0)
MCV: 86.3 fL (ref 78.0–100.0)
MCV: 85.4 fL (ref 78.0–100.0)
PLATELETS: 170 10*3/uL (ref 150–400)
Platelets: 147 10*3/uL — ABNORMAL LOW (ref 150–400)
RBC: 4.6 MIL/uL (ref 4.22–5.81)
RBC: 4.17 MIL/uL — ABNORMAL LOW (ref 4.22–5.81)
RDW: 16.6 % — ABNORMAL HIGH (ref 11.5–15.5)
RDW: 16.9 % — AB (ref 11.5–15.5)
WBC: 7.2 10*3/uL (ref 4.0–10.5)
WBC: 8.1 10*3/uL (ref 4.0–10.5)

## 2014-04-07 LAB — MAGNESIUM: Magnesium: 1.7 mg/dL (ref 1.5–2.5)

## 2014-04-07 LAB — BASIC METABOLIC PANEL
ANION GAP: 14 (ref 5–15)
BUN: 33 mg/dL — ABNORMAL HIGH (ref 6–23)
CALCIUM: 7.7 mg/dL — AB (ref 8.4–10.5)
CO2: 13 mEq/L — ABNORMAL LOW (ref 19–32)
CREATININE: 3.31 mg/dL — AB (ref 0.50–1.35)
Chloride: 115 mEq/L — ABNORMAL HIGH (ref 96–112)
GFR calc Af Amer: 19 mL/min — ABNORMAL LOW (ref >90)
GFR, EST NON AFRICAN AMERICAN: 16 mL/min — AB (ref >90)
Glucose, Bld: 82 mg/dL (ref 70–99)
Potassium: 5.2 mEq/L (ref 3.7–5.3)
Sodium: 142 mEq/L (ref 137–147)

## 2014-04-07 LAB — CREATININE, URINE, RANDOM: Creatinine, Urine: 66.09 mg/dL

## 2014-04-07 LAB — SODIUM, URINE, RANDOM: Sodium, Ur: 49 mEq/L

## 2014-04-07 MED ORDER — SODIUM CHLORIDE 0.9 % IV BOLUS (SEPSIS)
1000.0000 mL | Freq: Once | INTRAVENOUS | Status: AC
  Administered 2014-04-06: 1000 mL via INTRAVENOUS

## 2014-04-07 MED ORDER — CETYLPYRIDINIUM CHLORIDE 0.05 % MT LIQD
7.0000 mL | Freq: Two times a day (BID) | OROMUCOSAL | Status: DC
  Administered 2014-04-07 – 2014-04-11 (×9): 7 mL via OROMUCOSAL

## 2014-04-07 MED ORDER — CHLORHEXIDINE GLUCONATE 0.12 % MT SOLN
15.0000 mL | Freq: Two times a day (BID) | OROMUCOSAL | Status: DC
  Administered 2014-04-07 – 2014-04-11 (×9): 15 mL via OROMUCOSAL
  Filled 2014-04-07 (×8): qty 15

## 2014-04-07 MED ORDER — SODIUM CHLORIDE 0.9 % IV BOLUS (SEPSIS): 1000.0000 mL | Freq: Once | INTRAVENOUS | Status: DC

## 2014-04-07 MED ORDER — SODIUM CHLORIDE 0.9 % IV SOLN
INTRAVENOUS | Status: DC
  Administered 2014-04-07 – 2014-04-08 (×2): via INTRAVENOUS
  Administered 2014-04-08: 1000 mL via INTRAVENOUS
  Administered 2014-04-08: 12:00:00 via INTRAVENOUS
  Administered 2014-04-09: 1000 mL via INTRAVENOUS

## 2014-04-07 NOTE — Progress Notes (Signed)
Dr Lovell Sheehan called with CVP result of 6 and bladder scan of . Order received to bolus patient with 1,057mls of NS over two hours. Continue monitoring CVP. If blood pressure drops and stays below 80 systolic then ok to start low dose dopamine. Will continue to monitor and document any changes in patient condition.

## 2014-04-07 NOTE — Anesthesia Postprocedure Evaluation (Addendum)
  Anesthesia Post-op Note  Patient: [name]  Procedure(s) Performed: Procedure(s): EXPLORATORY LAPAROTOMY, GRAHAM PLICATION (N/A) INSERTION CENTRAL LINE ADULT (Left)  Patient Location: ICU  Anesthesia Type:General  Level of Consciousness: patient cooperative and confused  Airway and Oxygen Therapy: Patient Spontanous Breathing and Patient connected to nasal cannula oxygen  Post-op Pain: mild  Post-op Assessment: Post-op Vital signs reviewed, Patient's Cardiovascular Status Stable and Respiratory Function Stable  Post-op Vital Signs: Reviewed and stable  Last Vitals:  Filed Vitals:   04/07/14 1500  BP: 119/61  Pulse: 85  Temp:   Resp: 29    Complications: No apparent anesthesia complications.  Patient on pre-op Beta Blocker. Had unstable vital signs and decreased urine output during the night.

## 2014-04-07 NOTE — Consult Note (Signed)
[name] MRN: [id number] DOB/AGE: [birth year] 80 y.o. Primary Care Physician:BUTLER, CYNTHIA, DO Admit date: 04/06/2014 Chief Complaint:  Chief Complaint  Patient presents with  . Abdominal Pain   HPI: Pt is 80 year old male with past medical hx of CAD who presented to ER with c/o Abdominal pain.  HPI dates back to past 1 week when pt started having abdominal pain, it was progressive in mature. Pt on presentation to Er had work up done which showed Peritonitis and Pneumoperitoneum. Pt was hypotensive in ER Pt later had exploratory laparotomy, Graham plication done. Pt continues to be hypotensive and near to requiring pressors . Pt says I am better. No c/o chest pain No c/o dyspnea No fever/cough/chills No change in speech/vision. Pt says nausea is better.  Past Medical History  Diagnosis Date  . Coronary atherosclerosis of native coronary artery     Multivessel  . Essential hypertension, benign   . Mixed hyperlipidemia   . Erectile dysfunction   . Osteoarthritis   . Myocardial infarction     IMI 1987 and PLMI 1997  . Rheumatoid arthritis, adult        Family History  Problem Relation Age of Onset  . Coronary artery disease Father     Died age 38 with MI  . Diabetes type II Sister     Social History:  reports that he quit smoking about 2 years ago. His smoking use included Cigarettes. He has a 15 pack-year smoking history. He has never used smokeless tobacco. He reports that he does not drink alcohol or use illicit drugs.   Allergies: No Known Allergies  Medications Prior to Admission  Medication Sig Dispense Refill  . atenolol (TENORMIN) 25 MG tablet Take 25 mg by mouth daily.       . AVODART 0.5 MG capsule Take 0.5 mg by mouth daily.       . citalopram (CELEXA) 10 MG tablet Take 20 mg by mouth daily.       Marland Kitchen DIOVAN 80 MG tablet Take 80 mg by mouth daily.       Marland Kitchen ezetimibe (ZETIA) 10 MG tablet Take 10 mg by mouth daily.        . furosemide (LASIX) 20 MG  tablet Take 1 tablet (20 mg total) by mouth daily.  30 tablet  6  . nitroGLYCERIN (NITROSTAT) 0.4 MG SL tablet Place 1 tablet (0.4 mg total) under the tongue every 5 (five) minutes as needed for chest pain.  25 tablet  6  . oxyCODONE (ROXICODONE) 15 MG immediate release tablet Take 15 mg by mouth every 4 (four) hours as needed for pain.      . potassium chloride (K-DUR) 10 MEQ tablet Take 10 mEq by mouth 2 (two) times daily.      . risperiDONE (RISPERDAL) 0.5 MG tablet Take 0.5 mg by mouth 2 (two) times daily.      . simvastatin (ZOCOR) 20 MG tablet Take 20 mg by mouth at bedtime.       . tamsulosin (FLOMAX) 0.4 MG CAPS capsule Take 0.4 mg by mouth daily after supper.       . Thiamine HCl (VITAMIN B-1) 250 MG tablet Take 250 mg by mouth daily.        Marland Kitchen zolpidem (AMBIEN) 10 MG tablet Take 10 mg by mouth at bedtime as needed for sleep.           XTG:GYIRS from the symptoms mentioned above,there are no other symptoms referable to all  systems reviewed.  Marland Kitchen antiseptic oral rinse  7 mL Mouth Rinse BID  . antiseptic oral rinse  7 mL Mouth Rinse q12n4p  . chlorhexidine  15 mL Mouth Rinse BID  . heparin  5,000 Units Subcutaneous 3 times per day  . pantoprazole (PROTONIX) IV  40 mg Intravenous Daily  . piperacillin-tazobactam (ZOSYN)  IV  2.25 g Intravenous Q8H      Physical Exam: Vital signs in last 24 hours: Temp:  [97.6 F (36.4 C)-100 F (37.8 C)] 98.3 F (36.8 C) (09/02 0740) Pulse Rate:  [71-110] 95 (09/02 0800) Resp:  [24-35] 24 (09/02 0800) BP: (88-159)/(37-99) 95/62 mmHg (09/02 0800) SpO2:  [97 %-100 %] 100 % (09/02 0800) Weight change:  Last BM Date:  (unknown)  Intake/Output from previous day: 09/01 0701 - 09/02 0700 In: 5672.5 [I.V.:4552.5; NG/GT:20; IV Piggyback:1100] Out: 850 [Urine:350; Emesis/NG output:100; Drains:300] Total I/O In: 150 [I.V.:150] Out: 350 [Urine:300; Drains:50]   Physical Exam: General- pt is awake,alert, oriented to time place and person Resp-  No acute REsp distress, CTA B/L NO Rhonchi CVS- S1S2 regular ij rate and rhythm GIT- BS+, soft, NT, ND EXT- NO LE Edema, Cyanosis CNS- CN 2-12 grossly intact. Moving all 4 extremities Psych- normal mood and affect    Lab Results: CBC  Recent Labs  04/06/14 0653 04/07/14 0506  WBC 15.7* 7.2  HGB 14.1 13.3  HCT 42.1 39.7  PLT 189 170    BMET  Recent Labs  04/06/14 0653 04/07/14 0506  NA 138 142  K 5.0 5.2  CL 102 115*  CO2 15* 13*  GLUCOSE 212* 82  BUN 26* 33*  CREATININE 3.29* 3.31*  CALCIUM 9.2 7.7*   Creat Trend 2015 3.29=>3.31 2014 1.04 2013 0.9--1.44 2012 1.25 2009 1.23    MICRO Recent Results (from the past 240 hour(s))  MRSA PCR SCREENING     Status: None   Collection Time    04/06/14  2:13 PM      Result Value Ref Range Status   MRSA by PCR NEGATIVE  NEGATIVE Final   Comment:            The GeneXpert MRSA Assay (FDA     approved for NASAL specimens     only), is one component of a     comprehensive MRSA colonization     surveillance program. It is not     intended to diagnose MRSA     infection nor to guide or     monitor treatment for     MRSA infections.      Lab Results  Component Value Date   CALCIUM 7.7* 04/07/2014   PHOS 4.7* 04/07/2014      Impression: 1)Renal AKI secondary to ATN/Prerenal               Most likely Oliguria ATN               ATN sec to Hypotension/ ARB as outpt/Hypovolemia                AKI on CKD               CKD stage 3.               CKD since 2009               CKD secondary to DM/Age asso decline                Progression of CKD marked with  AKI in 2013 and now in 2015                 2)CVS- Hemodynamically unstable    3)Anemia HGb at goal (9--11)   4)CKD Mineral-Bone Disorder PTH not avail . Secondary Hyperparathyroidism w/u pending . Phosphorus not at goal. But cannot give binders and will folow in this acute state  5)GI- admitted with perforated peptic ulcer Sx  following  6)Electrolytes Normokalemic   Potassium trending up sec to  AKI, CKd and Iatrogenic ( on LR)  NOrmonatremic   7)Acid base Co2 at goal     Plan:  Will suggest to change  LR to  LR alternating with Ns prevent Hyperkalemia Will ask for Urine na and creat Agree with strict I/O Agree with holding ARB Agree with Zosyn dosing Will try Diuretics later ( once Fluid status better ) to convert Oliguric ATn to non oluiguria No need of Renal replacement therapy for now      Randol Zumstein S 04/07/2014, 8:58 AM

## 2014-04-07 NOTE — Addendum Note (Signed)
Addendum created 04/07/14 1609 by Franco Nones, CRNA   Modules edited: Notes Section   Notes Section:  File: [id number]; File: [id number]

## 2014-04-07 NOTE — Progress Notes (Signed)
1 Day Post-Op  Subjective: Patient comfortable. States his abdominal pain is much less than it was prior to surgery.  Objective: Vital signs in last 24 hours: Temp:  [97.6 F (36.4 C)-100 F (37.8 C)] 98.3 F (36.8 C) (09/02 0740) Pulse Rate:  [71-110] 95 (09/02 0800) Resp:  [24-35] 24 (09/02 0800) BP: (88-159)/(37-99) 95/62 mmHg (09/02 0800) SpO2:  [97 %-100 %] 100 % (09/02 0800) Last BM Date:  (unknown)  Intake/Output from previous day: 09/01 0701 - 09/02 0700 In: 5672.5 [I.V.:4552.5; NG/GT:20; IV Piggyback:1100] Out: 850 [Urine:350; Emesis/NG output:100; Drains:300] Intake/Output this shift: Total I/O In: 150 [I.V.:150] Out: 350 [Urine:300; Drains:50]  General appearance: alert, cooperative and no distress Resp: clear to auscultation bilaterally Cardio: regular rate and rhythm, S1, S2 normal, no murmur, click, rub or gallop GI: Soft. Dressing dry and intact. JP drainage serosanguineous in nature.  Lab Results:   Recent Labs  04/06/14 0653 04/07/14 0506  WBC 15.7* 7.2  HGB 14.1 13.3  HCT 42.1 39.7  PLT 189 170   BMET  Recent Labs  04/06/14 0653 04/07/14 0506  NA 138 142  K 5.0 5.2  CL 102 115*  CO2 15* 13*  GLUCOSE 212* 82  BUN 26* 33*  CREATININE 3.29* 3.31*  CALCIUM 9.2 7.7*   PT/INR No results found for this basename: LABPROT, INR,  in the last 72 hours  Studies/Results: Ct Abdomen Pelvis Wo Contrast  04/06/2014   CLINICAL DATA:  Abdominal pain, vomiting  EXAM: CT ABDOMEN AND PELVIS WITHOUT CONTRAST  TECHNIQUE: Multidetector CT imaging of the abdomen and pelvis was performed following the standard protocol without IV contrast.  COMPARISON:  None.  FINDINGS: Lung bases are unremarkable. Sagittal images of the spine shows levoscoliosis lumbar spine. Extensive degenerative changes lumbar spine.  Oral contrast material was given to the patient. Unenhanced liver shows no biliary ductal dilatation. There is high density perihepatic ascites. High-density  ascites is noted in right paracolic gutter. High-density ascites noted left paracolic gutter and within pelvis. This may be due to contrast material, hemorrhagic products or proteinaceous products. There is free air in upper abdomen in left upper quadrant and posterior to the stomach. Findings are consistent with perforated viscus. There is abnormal thickening of duodenal wall in axial image 27. Findings are highly suspicious for perforated duodenum, stomach or colon. There is significant left hydronephrosis and significant left renal cortical thinning probable due to chronic obstructive uropathy. 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 There is a calcification in left upper abdomen measures 9 mm. Extensive atherosclerotic vascular calcifications. No aortic aneurysm. No small bowel obstruction. No colonic obstruction. Some stool noted within rectum. Prostate gland and seminal vesicles are unremarkable. No destructive bony lesions are noted within pelvis. Degenerative changes bilateral hip joints.  IMPRESSION: 1. There is high density ascites consistent with peritonitis. This may be due to oral contrast, hemorrhagic products or proteinaceous products. 2. There is free air in upper abdomen consistent with perforated viscus. Mild thickening of duodenal wall. Given high density ascites this may be due to duodenal perforation, gastric perforation or upper colonic perforation. 3. No small bowel obstruction. 4. There is probable chronic significant left hydronephrosis probable due to chronic UPJ obstruction. 5. Extensive degenerative changes lumbar spine. Critical Value/emergent results were called by telephone at the time of interpretation on 04/06/2014 at 9:19 am to Dr. Geoffery Lyons , who verbally acknowledged these results.   Electronically Signed   By: Natasha Mead M.D.   On: 04/06/2014 09:20   Dg  Chest Portable 1 View  04/06/2014   CLINICAL DATA:  Abdominal pain.  EXAM: PORTABLE CHEST - 1 VIEW  COMPARISON:  None.   FINDINGS: Lungs volumes are low with mild atelectasis. No pneumothorax or pleural effusion. Heart size is normal.  IMPRESSION: No acute disease in a low volume chest.   Electronically Signed   By: Drusilla Kanner M.D.   On: 04/06/2014 09:28   Dg Chest Port 1v Same Day  04/06/2014   CLINICAL DATA:  Line placement  EXAM: PORTABLE CHEST - 1 VIEW SAME DAY  COMPARISON:  Chest radiograph 04/06/2014  FINDINGS: NG tube tip projects inferior to the diaphragm. Interval insertion of left subclavian central venous catheter with tip projecting over the superior vena cava. Stable cardiac and mediastinal contours. Mild elevation right hemidiaphragm with right basilar opacities, favored to represent atelectasis. No pleural effusion or pneumothorax. Postsurgical change proximal right humerus. Free intraperitoneal air.  IMPRESSION: Left subclavian central venous catheter tip projects over the superior vena cava. No pneumothorax.  Free intraperitoneal air, concerning for perforated hollow viscus. This was reported on prior CT 04/06/2014.   Electronically Signed   By: Annia Belt M.D.   On: 04/06/2014 13:25    Anti-infectives: Anti-infectives   Start     Dose/Rate Route Frequency Ordered Stop   04/06/14 1800  piperacillin-tazobactam (ZOSYN) IVPB 2.25 g     2.25 g 100 mL/hr over 30 Minutes Intravenous Every 8 hours 04/06/14 1458     04/06/14 1600  piperacillin-tazobactam (ZOSYN) IVPB 3.375 g  Status:  Discontinued     3.375 g 12.5 mL/hr over 240 Minutes Intravenous Every 8 hours 04/06/14 1438 04/06/14 1453   04/06/14 0930  piperacillin-tazobactam (ZOSYN) IVPB 3.375 g     3.375 g 100 mL/hr over 30 Minutes Intravenous  Once 04/06/14 0923 04/06/14 1036      Assessment/Plan: s/p Procedure(s): EXPLORATORY LAPAROTOMY, GRAHAM PLICATION INSERTION CENTRAL LINE ADULT Impression: Status post repair of perforated peptic ulcer. Acute renal failure appears stable. Urine output starting to increase with fluid boluses. Blood  pressure has stabilized. We'll get nephrology involved due to renal failure and low bicarbonate. CVP remains low. Has received over 5.5 L of fluid over the past 24 hours. Pulmonary status stable.  LOS: 1 day    Zenda Herskowitz A 04/07/2014

## 2014-04-07 NOTE — Clinical Documentation Improvement (Signed)
Please clarify if you agree pt with left hydronephrosis or other diagnosis and document in pn or d/c summary  Possible Clinical Conditions? ____________________  ____________________ ____________________ _______Other Condition__________________ _______Cannot Clinically Determine   Supporting Information: Risk Factors: Perforated viscus, peritonitis, abd pain,   Signs & Symptoms   Diagnostics:  CT abd 04/06/14: There is significant left hydronephrosis and significant left renal cortical thinning probable due to chronic obstructive uropathy.   There is probable chronic significant left hydronephrosis probable due to chronic UPJ obstruction.  Treatment Monitoring  Thank You, Enis Slipper ,RN Clinical Documentation Specialist:  [phone]  [facility]- Health Information Management

## 2014-04-07 NOTE — Progress Notes (Signed)
UR chart review completed.  

## 2014-04-07 NOTE — Clinical Documentation Improvement (Signed)
Please clarify if peritonitis noted per H/P can be further specified as one of the diagnoses listed below and document in pn or d/c summary.   Possible Clinical Conditions? Acute Peritionitis  ____________________ ____________________ _______Other Condition__________________ _______Cannot Clinically Determine   Supporting Information: Risk Factors: Perforated viscus, peritonitis, abd pain,  Signs & Symptoms:  Diagnostics: Op note: A significant amount of gastric contents were noted in the abdominal cavity. 04/06/14  CT abd: There is high density ascites consistent with peritonitis. This may be due to oral contrast, hemorrhagic products or proteinaceous products. 04/06/14   Treatment fentaNYL  Thank You, Enis Slipper ,RN Clinical Documentation Specialist:  [phone]  [facility]- Health Information Management

## 2014-04-07 NOTE — Plan of Care (Signed)
Problem: Phase I Progression Outcomes Goal: Voiding-avoid urinary catheter unless indicated Outcome: Not Progressing Pt in acute renal failure. md ordered not to remove foley. Nephrology consult ordered.

## 2014-04-07 NOTE — Progress Notes (Signed)
Dr Lovell Sheehan called with low blood pressure and only 25 ml urine output. Orders received for routine monitoring of CVP. Call results of CVP for further orders.

## 2014-04-07 NOTE — Clinical Documentation Improvement (Signed)
Please clarify if you agree pt with high density ascites per ct abd 04/06/14 or other diagnosis and document in pn or d/c summary  Possible Clinical Conditions? ____________________  ____________________ ____________________ _______Other Condition__________________ _______Cannot Clinically Determine   Supporting Information: Risk Factors: Perforated viscus, peritonitis, abd pain,  Exploratory lap  Signs & Symptoms: CT abd and pelvisThere is high density perihepatic ascites.  Diagnostics: CT abd 04/06/14:There is high density ascites consistent with peritonitis   Treatment fentaNYL   Thank You, Enis Slipper ,RN Clinical Documentation Specialist:  [phone]  [facility]- Health Information Management

## 2014-04-08 LAB — PHOSPHORUS: PHOSPHORUS: 4.2 mg/dL (ref 2.3–4.6)

## 2014-04-08 LAB — BASIC METABOLIC PANEL
ANION GAP: 14 (ref 5–15)
BUN: 37 mg/dL — ABNORMAL HIGH (ref 6–23)
CALCIUM: 8.3 mg/dL — AB (ref 8.4–10.5)
CHLORIDE: 117 meq/L — AB (ref 96–112)
CO2: 15 mEq/L — ABNORMAL LOW (ref 19–32)
CREATININE: 2.66 mg/dL — AB (ref 0.50–1.35)
GFR calc non Af Amer: 21 mL/min — ABNORMAL LOW (ref >90)
GFR, EST AFRICAN AMERICAN: 25 mL/min — AB (ref >90)
Glucose, Bld: 77 mg/dL (ref 70–99)
Potassium: 4.1 mEq/L (ref 3.7–5.3)
SODIUM: 146 meq/L (ref 137–147)

## 2014-04-08 LAB — CBC
HCT: 34.5 % — ABNORMAL LOW (ref 39.0–52.0)
HEMOGLOBIN: 11.3 g/dL — AB (ref 13.0–17.0)
MCH: 28 pg (ref 26.0–34.0)
MCHC: 32.8 g/dL (ref 30.0–36.0)
MCV: 85.6 fL (ref 78.0–100.0)
Platelets: 144 10*3/uL — ABNORMAL LOW (ref 150–400)
RBC: 4.03 MIL/uL — ABNORMAL LOW (ref 4.22–5.81)
RDW: 16.9 % — AB (ref 11.5–15.5)
WBC: 7.5 10*3/uL (ref 4.0–10.5)

## 2014-04-08 LAB — MAGNESIUM: MAGNESIUM: 1.6 mg/dL (ref 1.5–2.5)

## 2014-04-08 NOTE — Care Management Note (Addendum)
    Page 1 of 1   04/13/2014     4:13:01 PM CARE MANAGEMENT NOTE 04/13/2014  Patient:  [name], [name]   Account Number:  [id number]  Date Initiated:  04/08/2014  Documentation initiated by:  Sharrie Rothman  Subjective/Objective Assessment:   Pt admitted from home s/p perforated viscus. Pt lives with his daughter prior to admission.     Action/Plan:   Unable to speak with pts daughter [name]. Voicemail left with daughter to return call to discuss discharge needs and disposition. ? need for SNF at discharge.   Anticipated DC Date:  04/13/2014   Anticipated DC Plan:  SKILLED NURSING FACILITY  In-house referral  Clinical Social Worker      DC Planning Services  CM consult      Choice offered to / List presented to:             Status of service:  Completed, signed off Medicare Important Message given?  YES (If response is "NO", the following Medicare IM given date fields will be blank) Date Medicare IM given:  04/13/2014 Medicare IM given by:  Anibal Henderson Date Additional Medicare IM given:   Additional Medicare IM given by:    Discharge Disposition:  SKILLED NURSING FACILITY  Per UR Regulation:  Reviewed for med. necessity/level of care/duration of stay  If discussed at Long Length of Stay Meetings, dates discussed:    Comments:  04/13/14 1600 Anibal Henderson RN/CM Pt had cardiac arrest, was in ICU on the vent, but weaned successfully, and is now ready for D/C, but not yet to home.Pt D/C to [facility] 04/08/14 1515 Arlyss Queen, RN BSN CM Spoke to pts daughter, [name], with whom pt lives with about discharge. Daughter would like to take pt home. Pt has 2 grandsons who live in the home and they are there to assist with care of the pt. Daughter would like full home health services; RN, PT, aide, and CSW at discharge. Pt has a cane and walker that pt was not using at this time. Will continue to follow for discharge planning needs.  04/08/14 1455 Arlyss Queen, RN BSN CM

## 2014-04-08 NOTE — Progress Notes (Signed)
Subjective: Interval History: has no complaint of difficulty in breathing or orthopnea. Presently he doesn't move his bowels. Patient offers no complaints..  Objective: Vital signs in last 24 hours: Temp:  [98 F (36.7 C)-99.2 F (37.3 C)] 98 F (36.7 C) (09/03 0750) Pulse Rate:  [59-100] 63 (09/03 0800) Resp:  [19-31] 23 (09/03 0800) BP: (112-154)/(56-92) 143/59 mmHg (09/03 0800) SpO2:  [90 %-100 %] 90 % (09/03 0800) Weight:  [58.3 kg (128 lb 8.5 oz)] 58.3 kg (128 lb 8.5 oz) (09/03 0500) Weight change:   Intake/Output from previous day: 09/02 0701 - 09/03 0700 In: 4037.5 [I.V.:3887.5; IV Piggyback:150] Out: 2235 [Urine:1225; Emesis/NG output:450; Drains:560] Intake/Output this shift: Total I/O In: 150 [I.V.:150] Out: 165 [Urine:150; Drains:15]  General appearance: alert, cooperative and no distress Resp: clear to auscultation bilaterally Cardio: regular rate and rhythm, S1, S2 normal, no murmur, click, rub or gallop GI: hypoactive Extremities: extremities normal, atraumatic, no cyanosis or edema  Lab Results:  Recent Labs  04/07/14 2104 04/08/14 0409  WBC 8.1 7.5  HGB 11.9* 11.3*  HCT 35.6* 34.5*  PLT 147* 144*   BMET:  Recent Labs  04/07/14 0506 04/08/14 0409  NA 142 146  K 5.2 4.1  CL 115* 117*  CO2 13* 15*  GLUCOSE 82 77  BUN 33* 37*  CREATININE 3.31* 2.66*  CALCIUM 7.7* 8.3*   No results found for this basename: PTH,  in the last 72 hours Iron Studies: No results found for this basename: IRON, TIBC, TRANSFERRIN, FERRITIN,  in the last 72 hours  Studies/Results: Dg Chest Port 1v Same Day  04/06/2014   CLINICAL DATA:  Line placement  EXAM: PORTABLE CHEST - 1 VIEW SAME DAY  COMPARISON:  Chest radiograph 04/06/2014  FINDINGS: NG tube tip projects inferior to the diaphragm. Interval insertion of left subclavian central venous catheter with tip projecting over the superior vena cava. Stable cardiac and mediastinal contours. Mild elevation right hemidiaphragm  with right basilar opacities, favored to represent atelectasis. No pleural effusion or pneumothorax. Postsurgical change proximal right humerus. Free intraperitoneal air.  IMPRESSION: Left subclavian central venous catheter tip projects over the superior vena cava. No pneumothorax.  Free intraperitoneal air, concerning for perforated hollow viscus. This was reported on prior CT 04/06/2014.   Electronically Signed   By: Annia Belt M.D.   On: 04/06/2014 13:25    I have reviewed the patient's current medications.  Assessment/Plan: Problem #1 acute kidney injury: His BUN is 37 creatinine is 2.66 presently his renal function seems to be improving. The etiology for his renal failure was thought to be secondary to ATN. Presently he is none oliguric. His potassium is good. Problem #2 status post surgery for perforated ulcer Problem #3  metabolic acidosis: CO2 is 15 low but improving. Presently his on lactated Ringer alternating with normal saline. Problem #4 metabolic bone disease: His calcium and phosphorus is range Problem #5 history of chronic kidney disease stage III. Problem #6 anemia: His hemoglobin slightly low but stable. Problem #7 hypertension: His blood pressure is reasonably controlled. Plan: We'll continue with present management but will decrease his IV fluid to 135 cc per hour. We'll check his basic metabolic panel in the morning.   LOS: 2 days   Julie-Anne Torain S 04/08/2014,10:47 AM

## 2014-04-08 NOTE — Progress Notes (Signed)
2 Days Post-Op  Subjective: Resting comfortably.  Objective: Vital signs in last 24 hours: Temp:  [98 F (36.7 C)-99.2 F (37.3 C)] 98 F (36.7 C) (09/03 0750) Pulse Rate:  [59-100] 63 (09/03 0800) Resp:  [19-33] 23 (09/03 0800) BP: (112-154)/(56-92) 143/59 mmHg (09/03 0800) SpO2:  [90 %-100 %] 90 % (09/03 0800) Weight:  [58.3 kg (128 lb 8.5 oz)] 58.3 kg (128 lb 8.5 oz) (09/03 0500) Last BM Date:  (unknown)  Intake/Output from previous day: 09/02 0701 - 09/03 0700 In: 4037.5 [I.V.:3887.5; IV Piggyback:150] Out: 2235 [Urine:1225; Emesis/NG output:450; Drains:560] Intake/Output this shift: Total I/O In: 150 [I.V.:150] Out: 165 [Urine:150; Drains:15]  General appearance: alert, cooperative and no distress Resp: clear to auscultation bilaterally Cardio: regular rate and rhythm, S1, S2 normal, no murmur, click, rub or gallop GI: Soft, some bloody drainage noted a JP drain, though this is decreasing. Incision healing well to  Lab Results:   Recent Labs  04/07/14 2104 04/08/14 0409  WBC 8.1 7.5  HGB 11.9* 11.3*  HCT 35.6* 34.5*  PLT 147* 144*   BMET  Recent Labs  04/07/14 0506 04/08/14 0409  NA 142 146  K 5.2 4.1  CL 115* 117*  CO2 13* 15*  GLUCOSE 82 77  BUN 33* 37*  CREATININE 3.31* 2.66*  CALCIUM 7.7* 8.3*   PT/INR No results found for this basename: LABPROT, INR,  in the last 72 hours  Studies/Results: Dg Chest Portable 1 View  04/06/2014   CLINICAL DATA:  Abdominal pain.  EXAM: PORTABLE CHEST - 1 VIEW  COMPARISON:  None.  FINDINGS: Lungs volumes are low with mild atelectasis. No pneumothorax or pleural effusion. Heart size is normal.  IMPRESSION: No acute disease in a low volume chest.   Electronically Signed   By: Drusilla Kanner M.D.   On: 04/06/2014 09:28   Dg Chest Port 1v Same Day  04/06/2014   CLINICAL DATA:  Line placement  EXAM: PORTABLE CHEST - 1 VIEW SAME DAY  COMPARISON:  Chest radiograph 04/06/2014  FINDINGS: NG tube tip projects inferior to  the diaphragm. Interval insertion of left subclavian central venous catheter with tip projecting over the superior vena cava. Stable cardiac and mediastinal contours. Mild elevation right hemidiaphragm with right basilar opacities, favored to represent atelectasis. No pleural effusion or pneumothorax. Postsurgical change proximal right humerus. Free intraperitoneal air.  IMPRESSION: Left subclavian central venous catheter tip projects over the superior vena cava. No pneumothorax.  Free intraperitoneal air, concerning for perforated hollow viscus. This was reported on prior CT 04/06/2014.   Electronically Signed   By: Annia Belt M.D.   On: 04/06/2014 13:25    Anti-infectives: Anti-infectives   Start     Dose/Rate Route Frequency Ordered Stop   04/06/14 1800  piperacillin-tazobactam (ZOSYN) IVPB 2.25 g     2.25 g 100 mL/hr over 30 Minutes Intravenous Every 8 hours 04/06/14 1458     04/06/14 1600  piperacillin-tazobactam (ZOSYN) IVPB 3.375 g  Status:  Discontinued     3.375 g 12.5 mL/hr over 240 Minutes Intravenous Every 8 hours 04/06/14 1438 04/06/14 1453   04/06/14 0930  piperacillin-tazobactam (ZOSYN) IVPB 3.375 g     3.375 g 100 mL/hr over 30 Minutes Intravenous  Once 04/06/14 0923 04/06/14 1036      Assessment/Plan: s/p Procedure(s): EXPLORATORY LAPAROTOMY, GRAHAM PLICATION INSERTION CENTRAL LINE ADULT Impression: Stable on postoperative day 2. Renal function is improving. Urine output is picking up. Had episode of bright red blood per JP, though this  is decreasing. Hemoglobin is remaining relatively stable. Plan: We'll get patient up in chair today. May get upper GI series to assess the plication. Continue close monitoring.  LOS: 2 days    [name] A 04/08/2014

## 2014-04-08 NOTE — Addendum Note (Signed)
Addendum created 04/08/14 1051 by Earleen Newport, CRNA   Modules edited: Notes Section   Notes Section:  File: [id number]

## 2014-04-08 NOTE — Anesthesia Postprocedure Evaluation (Signed)
  Anesthesia Post-op Note  Patient: [name]  Procedure(s) Performed: Procedure(s): EXPLORATORY LAPAROTOMY, GRAHAM PLICATION (N/A) INSERTION CENTRAL LINE ADULT (Left)  Patient Location: ICU  Anesthesia Type:General  Level of Consciousness: patient cooperative and confused  Airway and Oxygen Therapy: Patient Spontanous Breathing and Patient connected to nasal cannula oxygen  Post-op Pain: mild  Post-op Assessment: Post-op Vital signs reviewed, Patient's Cardiovascular Status Stable, Respiratory Function Stable, Patent Airway, No signs of Nausea or vomiting and Pain level controlled  Post-op Vital Signs: Reviewed and stable  Last Vitals:  Filed Vitals:   04/08/14 0800  BP: 143/59  Pulse: 63  Temp:   Resp: 23    Complications: No apparent anesthesia complications Patient was on pre-op Beta Blocker; apparently held due to unstable VSS; hemodynamics are stable at this time; addressed resuming beta blocker with RN

## 2014-04-09 ENCOUNTER — Inpatient Hospital Stay (HOSPITAL_COMMUNITY): Payer: Medicare Other

## 2014-04-09 DIAGNOSIS — I469 Cardiac arrest, cause unspecified: Secondary | ICD-10-CM

## 2014-04-09 DIAGNOSIS — R1013 Epigastric pain: Secondary | ICD-10-CM

## 2014-04-09 LAB — BLOOD GAS, ARTERIAL
Acid-base deficit: 14.2 mmol/L — ABNORMAL HIGH (ref 0.0–2.0)
BICARBONATE: 10.7 meq/L — AB (ref 20.0–24.0)
Drawn by: 234301
FIO2: 100 %
MECHVT: 500 mL
O2 Saturation: 99.3 %
PEEP: 5 cmH2O
PH ART: 7.326 — AB (ref 7.350–7.450)
Patient temperature: 37
RATE: 18 resp/min
TCO2: 9.9 mmol/L (ref 0–100)
pCO2 arterial: 21.2 mmHg — ABNORMAL LOW (ref 35.0–45.0)
pO2, Arterial: 270 mmHg — ABNORMAL HIGH (ref 80.0–100.0)

## 2014-04-09 LAB — CBC WITH DIFFERENTIAL/PLATELET
Basophils Absolute: 0 10*3/uL (ref 0.0–0.1)
Basophils Relative: 0 % (ref 0–1)
Eosinophils Absolute: 0 10*3/uL (ref 0.0–0.7)
Eosinophils Relative: 0 % (ref 0–5)
HEMATOCRIT: 38.6 % — AB (ref 39.0–52.0)
Hemoglobin: 12.6 g/dL — ABNORMAL LOW (ref 13.0–17.0)
LYMPHS ABS: 2.2 10*3/uL (ref 0.7–4.0)
LYMPHS PCT: 16 % (ref 12–46)
MCH: 28.5 pg (ref 26.0–34.0)
MCHC: 32.6 g/dL (ref 30.0–36.0)
MCV: 87.3 fL (ref 78.0–100.0)
MONO ABS: 0.8 10*3/uL (ref 0.1–1.0)
MONOS PCT: 6 % (ref 3–12)
Neutro Abs: 10.4 10*3/uL — ABNORMAL HIGH (ref 1.7–7.7)
Neutrophils Relative %: 78 % — ABNORMAL HIGH (ref 43–77)
Platelets: 178 10*3/uL (ref 150–400)
RBC: 4.42 MIL/uL (ref 4.22–5.81)
RDW: 17.2 % — ABNORMAL HIGH (ref 11.5–15.5)
WBC: 13.4 10*3/uL — AB (ref 4.0–10.5)

## 2014-04-09 LAB — MAGNESIUM: Magnesium: 1.7 mg/dL (ref 1.5–2.5)

## 2014-04-09 LAB — TROPONIN I
Troponin I: 2.81 ng/mL (ref <0.30)
Troponin I: 4.48 ng/mL (ref <0.30)
Troponin I: 8 ng/mL (ref <0.30)
Troponin I: 11.45 ng/mL (ref <0.30)

## 2014-04-09 LAB — CBC
HEMATOCRIT: 35.1 % — AB (ref 39.0–52.0)
HEMOGLOBIN: 11.6 g/dL — AB (ref 13.0–17.0)
MCH: 28.4 pg (ref 26.0–34.0)
MCHC: 33 g/dL (ref 30.0–36.0)
MCV: 86 fL (ref 78.0–100.0)
Platelets: 160 10*3/uL (ref 150–400)
RBC: 4.08 MIL/uL — ABNORMAL LOW (ref 4.22–5.81)
RDW: 17.1 % — ABNORMAL HIGH (ref 11.5–15.5)
WBC: 9 10*3/uL (ref 4.0–10.5)

## 2014-04-09 LAB — COMPREHENSIVE METABOLIC PANEL
ALT: 26 U/L (ref 0–53)
AST: 41 U/L — AB (ref 0–37)
Albumin: 2.2 g/dL — ABNORMAL LOW (ref 3.5–5.2)
Alkaline Phosphatase: 48 U/L (ref 39–117)
BILIRUBIN TOTAL: 0.6 mg/dL (ref 0.3–1.2)
BUN: 30 mg/dL — ABNORMAL HIGH (ref 6–23)
CHLORIDE: 118 meq/L — AB (ref 96–112)
CO2: 11 meq/L — AB (ref 19–32)
CREATININE: 1.8 mg/dL — AB (ref 0.50–1.35)
Calcium: 8.7 mg/dL (ref 8.4–10.5)
GFR calc Af Amer: 40 mL/min — ABNORMAL LOW (ref >90)
GFR, EST NON AFRICAN AMERICAN: 34 mL/min — AB (ref >90)
Glucose, Bld: 85 mg/dL (ref 70–99)
Potassium: 3.3 mEq/L — ABNORMAL LOW (ref 3.7–5.3)
Sodium: 150 mEq/L — ABNORMAL HIGH (ref 137–147)
Total Protein: 5.2 g/dL — ABNORMAL LOW (ref 6.0–8.3)

## 2014-04-09 LAB — BASIC METABOLIC PANEL
Anion gap: 18 — ABNORMAL HIGH (ref 5–15)
BUN: 31 mg/dL — AB (ref 6–23)
CALCIUM: 8.6 mg/dL (ref 8.4–10.5)
CO2: 13 mEq/L — ABNORMAL LOW (ref 19–32)
CREATININE: 1.89 mg/dL — AB (ref 0.50–1.35)
Chloride: 118 mEq/L — ABNORMAL HIGH (ref 96–112)
GFR calc Af Amer: 37 mL/min — ABNORMAL LOW (ref >90)
GFR, EST NON AFRICAN AMERICAN: 32 mL/min — AB (ref >90)
Glucose, Bld: 68 mg/dL — ABNORMAL LOW (ref 70–99)
Potassium: 3.6 mEq/L — ABNORMAL LOW (ref 3.7–5.3)
Sodium: 149 mEq/L — ABNORMAL HIGH (ref 137–147)

## 2014-04-09 LAB — PHOSPHORUS: Phosphorus: 3.3 mg/dL (ref 2.3–4.6)

## 2014-04-09 LAB — PROTIME-INR
INR: 1.24 (ref 0.00–1.49)
Prothrombin Time: 15.6 seconds — ABNORMAL HIGH (ref 11.6–15.2)

## 2014-04-09 LAB — H. PYLORI ANTIBODY, IGG: H PYLORI IGG: 0.55 {ISR}

## 2014-04-09 MED ORDER — KETOROLAC TROMETHAMINE 15 MG/ML IJ SOLN: 15.0000 mg | Freq: Four times a day (QID) | INTRAMUSCULAR | Status: DC | PRN

## 2014-04-09 MED ORDER — VANCOMYCIN HCL IN DEXTROSE 750-5 MG/150ML-% IV SOLN
750.0000 mg | Freq: Once | INTRAVENOUS | Status: AC
  Administered 2014-04-09: 750 mg via INTRAVENOUS
  Filled 2014-04-09: qty 150

## 2014-04-09 MED ORDER — DOPAMINE-DEXTROSE 3.2-5 MG/ML-% IV SOLN
INTRAVENOUS | Status: AC
Start: 2014-04-09 — End: 2014-04-09
  Filled 2014-04-09: qty 250

## 2014-04-09 MED ORDER — ASPIRIN 300 MG RE SUPP
300.0000 mg | Freq: Once | RECTAL | Status: AC
  Administered 2014-04-09: 300 mg via RECTAL
  Filled 2014-04-09: qty 1

## 2014-04-09 MED ORDER — FUROSEMIDE 10 MG/ML IJ SOLN
40.0000 mg | Freq: Once | INTRAMUSCULAR | Status: AC
  Administered 2014-04-09: 40 mg via INTRAVENOUS
  Filled 2014-04-09: qty 4

## 2014-04-09 MED ORDER — STERILE WATER FOR INJECTION IV SOLN
INTRAVENOUS | Status: DC
  Administered 2014-04-09: 23:00:00 via INTRAVENOUS
  Filled 2014-04-09 (×11): qty 9.7

## 2014-04-09 MED ORDER — STERILE WATER FOR INJECTION IV SOLN
INTRAVENOUS | Status: DC
  Administered 2014-04-09 (×2): via INTRAVENOUS
  Filled 2014-04-09 (×11): qty 9.7

## 2014-04-09 MED ORDER — FENTANYL CITRATE 0.05 MG/ML IJ SOLN
50.0000 ug | INTRAMUSCULAR | Status: AC | PRN
  Administered 2014-04-09 – 2014-04-10 (×3): 50 ug via INTRAVENOUS
  Filled 2014-04-09 (×4): qty 2

## 2014-04-09 MED ORDER — AMIODARONE HCL IN DEXTROSE 360-4.14 MG/200ML-% IV SOLN
60.0000 mg/h | INTRAVENOUS | Status: AC
  Administered 2014-04-09: 60 mg/h via INTRAVENOUS
  Filled 2014-04-09: qty 200

## 2014-04-09 MED ORDER — POTASSIUM CHLORIDE 10 MEQ/100ML IV SOLN
10.0000 meq | INTRAVENOUS | Status: AC
  Administered 2014-04-09 (×4): 10 meq via INTRAVENOUS
  Filled 2014-04-09: qty 100

## 2014-04-09 MED ORDER — SODIUM CHLORIDE 0.9 % IV SOLN
500.0000 mg | INTRAVENOUS | Status: DC
  Administered 2014-04-10: 500 mg via INTRAVENOUS
  Filled 2014-04-09 (×3): qty 500

## 2014-04-09 MED ORDER — AMIODARONE LOAD VIA INFUSION
150.0000 mg | Freq: Once | INTRAVENOUS | Status: AC
  Administered 2014-04-09: 150 mg via INTRAVENOUS
  Filled 2014-04-09: qty 83.34

## 2014-04-09 MED ORDER — STERILE WATER FOR INJECTION IV SOLN
INTRAVENOUS | Status: DC
  Filled 2014-04-09 (×7): qty 9.7

## 2014-04-09 MED ORDER — PIPERACILLIN-TAZOBACTAM 3.375 G IVPB
3.3750 g | Freq: Three times a day (TID) | INTRAVENOUS | Status: DC
  Administered 2014-04-09 – 2014-04-11 (×8): 3.375 g via INTRAVENOUS
  Filled 2014-04-09 (×10): qty 50

## 2014-04-09 MED ORDER — AMIODARONE HCL IN DEXTROSE 360-4.14 MG/200ML-% IV SOLN
30.0000 mg/h | INTRAVENOUS | Status: DC
  Administered 2014-04-09 – 2014-04-12 (×6): 30 mg/h via INTRAVENOUS
  Filled 2014-04-09 (×6): qty 200

## 2014-04-09 MED ORDER — FUROSEMIDE 10 MG/ML IJ SOLN
60.0000 mg | Freq: Two times a day (BID) | INTRAMUSCULAR | Status: DC
  Administered 2014-04-09 – 2014-04-10 (×2): 60 mg via INTRAVENOUS
  Filled 2014-04-09 (×2): qty 6

## 2014-04-09 MED ORDER — METOPROLOL TARTRATE 1 MG/ML IV SOLN
INTRAVENOUS | Status: AC
  Filled 2014-04-09: qty 5

## 2014-04-09 MED ORDER — FENTANYL CITRATE 0.05 MG/ML IJ SOLN
50.0000 ug | INTRAMUSCULAR | Status: DC | PRN
  Administered 2014-04-09 – 2014-04-10 (×2): 50 ug via INTRAVENOUS
  Filled 2014-04-09 (×2): qty 2

## 2014-04-09 MED ORDER — METOPROLOL TARTRATE 1 MG/ML IV SOLN: 2.5000 mg | Freq: Four times a day (QID) | INTRAVENOUS | Status: DC

## 2014-04-09 NOTE — Consult Note (Signed)
Consult requested by:Dr. Samtani Consult requested for respiratory failure:  HPI: This is a 80 year old with a history of multiple medical problems who had ventricular tachycardia this morning. CODE BLUE was called and he was resuscitated and is now on a ventilator. He is still on 100% oxygen. He is moving around a little bit. He had surgery for perforated peptic ulcer he on 04/06/2014.  Past Medical History  Diagnosis Date  . Coronary atherosclerosis of native coronary artery     Multivessel  . Essential hypertension, benign   . Mixed hyperlipidemia   . Erectile dysfunction   . Osteoarthritis   . Myocardial infarction     IMI 1987 and PLMI 1997  . Rheumatoid arthritis, adult      Family History  Problem Relation Age of Onset  . Coronary artery disease Father     Died age 49 with MI  . Diabetes type II Sister      History   Social History  . Marital Status: Married    Spouse Name: N/A    Number of Children: N/A  . Years of Education: N/A   Occupational History  . retired     former Tax adviser; former Office manager   Social History Main Topics  . Smoking status: Former Smoker -- 0.25 packs/day for 60 years    Types: Cigarettes    Quit date: 07/14/2011  . Smokeless tobacco: Never Used     Comment: stopped 05-29-13  . Alcohol Use: No     Comment: one beeer every 6 months  . Drug Use: No  . Sexual Activity: None   Other Topics Concern  . None   Social History Narrative  . None     ROS: Unobtainable    Objective: Vital signs in last 24 hours: Temp:  [97.5 F (36.4 C)-98.7 F (37.1 C)] 98.7 F (37.1 C) (09/04 0400) Pulse Rate:  [29-160] 132 (09/04 0955) Resp:  [16-33] 33 (09/04 0955) BP: (123-187)/(49-126) 145/72 mmHg (09/04 0955) SpO2:  [71 %-100 %] 99 % (09/04 0955) FiO2 (%):  [100 %] 100 % (09/04 0954) Weight:  [59.1 kg (130 lb 4.7 oz)] 59.1 kg (130 lb 4.7 oz) (09/04 0630) Weight change: 0.8 kg (1 lb 12.2 oz) Last BM Date:   (unknown)  Intake/Output from previous day: 09/03 0701 - 09/04 0700 In: 4820 [I.V.:3555; NG/GT:350; IV Piggyback:150] Out: 1385 [Urine:700; Emesis/NG output:500; Drains:185]  PHYSICAL EXAM He is intubated and on the ventilator. He is moving a little bit. His oxygen saturation is in the 80s on 100% O2. He has changes in his joints of rheumatoid arthritis. He does not have any edema. His chest is relatively clear. His heart is irregularly irregular. He has a postsurgical abdomen does have some bowel sounds. Central nervous system examination cannot be really assessed  Lab Results: Basic Metabolic Panel:  Recent Labs  55/73/22 0409 04/09/14 0427 04/09/14 0947  NA 146 149* 150*  K 4.1 3.6* 3.3*  CL 117* 118* 118*  CO2 15* 13* 11*  GLUCOSE 77 68* 85  BUN 37* 31* 30*  CREATININE 2.66* 1.89* 1.80*  CALCIUM 8.3* 8.6 8.7  MG 1.6 1.7  --   PHOS 4.2 3.3  --    Liver Function Tests:  Recent Labs  04/09/14 0947  AST 41*  ALT 26  ALKPHOS 48  BILITOT 0.6  PROT 5.2*  ALBUMIN 2.2*   No results found for this basename: LIPASE, AMYLASE,  in the last 72 hours No results found for this  basename: AMMONIA,  in the last 72 hours CBC:  Recent Labs  04/09/14 0427 04/09/14 0947  WBC 9.0 13.4*  NEUTROABS  --  10.4*  HGB 11.6* 12.6*  HCT 35.1* 38.6*  MCV 86.0 87.3  PLT 160 178   Cardiac Enzymes:  Recent Labs  04/09/14 0947  TROPONINI 2.81*   BNP: No results found for this basename: PROBNP,  in the last 72 hours D-Dimer: No results found for this basename: DDIMER,  in the last 72 hours CBG: No results found for this basename: GLUCAP,  in the last 72 hours Hemoglobin A1C: No results found for this basename: HGBA1C,  in the last 72 hours Fasting Lipid Panel: No results found for this basename: CHOL, HDL, LDLCALC, TRIG, CHOLHDL, LDLDIRECT,  in the last 72 hours Thyroid Function Tests: No results found for this basename: TSH, T4TOTAL, FREET4, T3FREE, THYROIDAB,  in the last 72  hours Anemia Panel: No results found for this basename: VITAMINB12, FOLATE, FERRITIN, TIBC, IRON, RETICCTPCT,  in the last 72 hours Coagulation:  Recent Labs  04/09/14 0947  LABPROT 15.6*  INR 1.24   Urine Drug Screen: Drugs of Abuse     Component Value Date/Time   LABOPIA NONE DETECTED 02/07/2012 2115   COCAINSCRNUR NONE DETECTED 02/07/2012 2115   LABBENZ NONE DETECTED 02/07/2012 2115   AMPHETMU NONE DETECTED 02/07/2012 2115   THCU NONE DETECTED 02/07/2012 2115   LABBARB NONE DETECTED 02/07/2012 2115    Alcohol Level: No results found for this basename: ETH,  in the last 72 hours Urinalysis: No results found for this basename: COLORURINE, APPERANCEUR, LABSPEC, PHURINE, GLUCOSEU, HGBUR, BILIRUBINUR, KETONESUR, PROTEINUR, UROBILINOGEN, NITRITE, LEUKOCYTESUR,  in the last 72 hours Misc. Labs:   ABGS:  Recent Labs  04/09/14 1220  PHART 7.326*  PO2ART 270.0*  TCO2 9.9  HCO3 10.7*     MICROBIOLOGY: Recent Results (from the past 240 hour(s))  MRSA PCR SCREENING     Status: None   Collection Time    04/06/14  2:13 PM      Result Value Ref Range Status   MRSA by PCR NEGATIVE  NEGATIVE Final   Comment:            The GeneXpert MRSA Assay (FDA     approved for NASAL specimens     only), is one component of a     comprehensive MRSA colonization     surveillance program. It is not     intended to diagnose MRSA     infection nor to guide or     monitor treatment for     MRSA infections.    Studies/Results: Dg Chest Port 1 View  04/09/2014   CLINICAL DATA:  Cardiac arrest  EXAM: PORTABLE CHEST - 1 VIEW  COMPARISON:  Chest radiograph 04/06/2014  FINDINGS: Left subclavian central venous catheter tip projects at the expected location of the central left brachiocephalic vein. Enteric tube courses inferior to the diaphragm. The endotracheal tube appears to terminate within the distal trachea. Stable cardiac and mediastinal contours. Interval development of heterogeneous opacities right  lung base. Probable skin fold projecting over the right lateral hemi thorax. Suggestion of deep sulcus on the left. No large consolidative opacities within left hemi thorax.  IMPRESSION: 1. Suggestion of deep sulcus on the left which is favored to be secondary to patient positioning. Additionally lucency around the aortic arch may be artifactual. Recommend attention on repeat evaluation to exclude the possibility of pneumothorax. 2. Endotracheal tube tip terminates 1.8 cm  superior to the carina. 3. New heterogeneous opacities right lung base may represent combination of pleural fluid and/or atelectasis. 4. Lucency within the peripheral right lower hemi thorax is favored to represent skin fold. These results will be called to the ordering clinician or representative by the Radiologist Assistant, and communication documented in the PACS or zVision Dashboard.   Electronically Signed   By: Annia Belt M.D.   On: 04/09/2014 09:56   Dg Chest Port 1v Same Day  04/09/2014   CLINICAL DATA:  Right rib fractures.  Possible pneumothorax.  EXAM: PORTABLE CHEST - 1 VIEW SAME DAY  COMPARISON:  04/09/2014.  FINDINGS: Support devices are in stable position. Right basilar airspace opacity and probable layering effusion, stable. Left basilar atelectasis or infiltrate also noted. No visible pneumothorax.  IMPRESSION: Bibasilar opacities, right greater than left. Small layering right effusion. No visible pneumothorax.   Electronically Signed   By: Charlett Nose M.D.   On: 04/09/2014 10:39    Medications:  Prior to Admission:  Prescriptions prior to admission  Medication Sig Dispense Refill  . atenolol (TENORMIN) 25 MG tablet Take 25 mg by mouth daily.       . AVODART 0.5 MG capsule Take 0.5 mg by mouth daily.       . citalopram (CELEXA) 10 MG tablet Take 20 mg by mouth daily.       Marland Kitchen DIOVAN 80 MG tablet Take 80 mg by mouth daily.       Marland Kitchen ezetimibe (ZETIA) 10 MG tablet Take 10 mg by mouth daily.        . furosemide (LASIX)  20 MG tablet Take 1 tablet (20 mg total) by mouth daily.  30 tablet  6  . nitroGLYCERIN (NITROSTAT) 0.4 MG SL tablet Place 1 tablet (0.4 mg total) under the tongue every 5 (five) minutes as needed for chest pain.  25 tablet  6  . oxyCODONE (ROXICODONE) 15 MG immediate release tablet Take 15 mg by mouth every 4 (four) hours as needed for pain.      . potassium chloride (K-DUR) 10 MEQ tablet Take 10 mEq by mouth 2 (two) times daily.      . risperiDONE (RISPERDAL) 0.5 MG tablet Take 0.5 mg by mouth 2 (two) times daily.      . simvastatin (ZOCOR) 20 MG tablet Take 20 mg by mouth at bedtime.       . tamsulosin (FLOMAX) 0.4 MG CAPS capsule Take 0.4 mg by mouth daily after supper.       . Thiamine HCl (VITAMIN B-1) 250 MG tablet Take 250 mg by mouth daily.        Marland Kitchen zolpidem (AMBIEN) 10 MG tablet Take 10 mg by mouth at bedtime as needed for sleep.       Scheduled: . antiseptic oral rinse  7 mL Mouth Rinse BID  . antiseptic oral rinse  7 mL Mouth Rinse q12n4p  . chlorhexidine  15 mL Mouth Rinse BID  . DOPamine      . furosemide  40 mg Intravenous Once  . metoprolol      . metoprolol  2.5 mg Intravenous 4 times per day  . pantoprazole (PROTONIX) IV  40 mg Intravenous Daily  . piperacillin-tazobactam (ZOSYN)  IV  3.375 g Intravenous Q8H  . vancomycin  750 mg Intravenous Once   Followed by  . [START ON 04/10/2014] vancomycin  500 mg Intravenous Q24H   Continuous: .  sodium bicarbonate infusion 1/4 NS 1000 mL 135 mL/hr  at 04/09/14 1100   QVZ:DGLOVFIEP, morphine injection, ondansetron (ZOFRAN) IV  Assesment: He had cardiac arrest and is now on mechanical ventilation for respiratory failure. He has known coronary artery occlusive disease had a recent perforated peptic ulcer and has rheumatoid arthritis. He is not known to have significant lung disease. Principal Problem:   Cardiac arrest Active Problems:   Coronary atherosclerosis of native coronary artery   Rheumatoid arthritis, adult   Upper GI  bleed   Perforated peptic ulcer    Plan: He will continue with current treatments. Apparently Initially family had wanted terminal weaning but since he's becoming more responsive they're discussing what they want to do now. He's not a candidate for standard weaning because he still on 100% oxygen and has fairly poor oxygen saturations    LOS: 3 days   Onie Kasparek L 04/09/2014, 12:47 PM

## 2014-04-09 NOTE — Code Documentation (Signed)
PT WENT INTO V-TACH. GASPING RESPIRATIONS. CPR AND MASK VENTILATION STARTED.CODE BLUE CALLED. SEE SEE CODE BLUE SHEET.

## 2014-04-09 NOTE — Progress Notes (Signed)
Present with family for emotional and spiritual support.  

## 2014-04-09 NOTE — Progress Notes (Signed)
Patient's troponin 11.45. Receiving fentanyl for pain. Frequent PACs and PVcs on heart monitor. Dr Claiborne Billings notified by phone. Daughter at bedside, and aware of changes.

## 2014-04-09 NOTE — H&P (Addendum)
[facility] History and Physical  KONG PACKETT KWI:[id number] DOB: 13-Sep-1932 DOA: 04/06/2014  Referring physician: Dr. Lovell Sheehan PCP: Samuel Jester, DO  Specialists: Gen surgery            Pulmonology  Chief Complaint: Kirby Funk and cardiac arrest  HPI:  80 y/o ?, known prior history MI 1987, 1997-status post CABG with residual CAD, CKD since 2009, hypertension, hyperlipidemia, osteoarthritis, rheumatoid arthritis--prior complicated admission 02/02/13 for Mallory-Weiss tear, hemorrhagic shock from anemia blood loss, non-ST MI/Vtach 4 hypokalemia and acute respiratory failure requiring intubation admitted from home on 04/06/49 with generalized abdominal pain found to be hypotensive-CT scan abdomen and pelvis revealed concern for peritonitis and free air in the upper abdomen, left hydronephrosis due to chronic UPJ obstruction. He was admitted emergently and taken to OR 04/06/14 where he had an exploratory laparotomy with perforated peptic ulcer which was repaired by Dr. Lovell Sheehan. Since admission because of suspected peritonitis he appeared to have developed AKI [multifactorial secondary to ARB] and hypertension but was alert conversant after surgery  He had some mild nausea and an NG tube was placed On the morning of 04/09/14 he went into an episode of unresponsiveness with V. tach with cold blue and was unresponsive and was coded as per ACLS guidelines as per Dr. Ronnald Ramp.  He had ROSC subsequently placed on ventilator. I was present for some of the events and patient was admitted to the ICU  Review of Systems: unobtainable  Past Medical History  Diagnosis Date  . Coronary atherosclerosis of native coronary artery     Multivessel  . Essential hypertension, benign   . Mixed hyperlipidemia   . Erectile dysfunction   . Osteoarthritis   . Myocardial infarction     IMI 1987 and PLMI 1997  . Rheumatoid arthritis, adult    Past Surgical History  Procedure Laterality Date  . Coronary artery bypass  graft  1987  . Carpal tunnel release    . Rotator cuff repair    . Neck surgery    . Esophagogastroduodenoscopy N/A 01/23/2013    Procedure: ESOPHAGOGASTRODUODENOSCOPY (EGD);  Surgeon: Shirley Friar, MD;  Location: [facility] ENDOSCOPY;  Service: Endoscopy;  Laterality: N/A;  . Esophagogastroduodenoscopy N/A 01/23/2013    Procedure: ESOPHAGOGASTRODUODENOSCOPY (EGD);  Surgeon: Shirley Friar, MD;  Location: [facility] ENDOSCOPY;  Service: Endoscopy;  Laterality: N/A;  . Laparotomy N/A 04/06/2014    Procedure: EXPLORATORY LAPAROTOMY, Lucious Groves;  Surgeon: Dalia Heading, MD;  Location: [facility] ORS;  Service: General;  Laterality: N/A;  . Central venous catheter insertion Left 04/06/2014    Procedure: INSERTION CENTRAL LINE ADULT;  Surgeon: Dalia Heading, MD;  Location: [facility] ORS;  Service: General;  Laterality: Left;   Social History:  History   Social History Narrative  . No narrative on file    No Known Allergies  Family History  Problem Relation Age of Onset  . Coronary artery disease Father     Died age 72 with MI  . Diabetes type II Sister     Prior to Admission medications   Medication Sig Start Date End Date Taking? Authorizing Provider  atenolol (TENORMIN) 25 MG tablet Take 25 mg by mouth daily.    Yes Historical Provider, MD  AVODART 0.5 MG capsule Take 0.5 mg by mouth daily.  06/19/13  Yes Historical Provider, MD  citalopram (CELEXA) 10 MG tablet Take 20 mg by mouth daily.  02/05/12  Yes Christiane Ha, MD  DIOVAN 80 MG tablet Take 80 mg by mouth  daily.  02/16/13  Yes Historical Provider, MD  ezetimibe (ZETIA) 10 MG tablet Take 10 mg by mouth daily.     Yes Historical Provider, MD  furosemide (LASIX) 20 MG tablet Take 1 tablet (20 mg total) by mouth daily. 01/05/13  Yes Jonelle Sidle, MD  nitroGLYCERIN (NITROSTAT) 0.4 MG SL tablet Place 1 tablet (0.4 mg total) under the tongue every 5 (five) minutes as needed for chest pain. 06/29/13  Yes Jonelle Sidle, MD  oxyCODONE  (ROXICODONE) 15 MG immediate release tablet Take 15 mg by mouth every 4 (four) hours as needed for pain.   Yes Historical Provider, MD  potassium chloride (K-DUR) 10 MEQ tablet Take 10 mEq by mouth 2 (two) times daily.   Yes Historical Provider, MD  risperiDONE (RISPERDAL) 0.5 MG tablet Take 0.5 mg by mouth 2 (two) times daily. 02/05/12  Yes Christiane Ha, MD  simvastatin (ZOCOR) 20 MG tablet Take 20 mg by mouth at bedtime.    Yes Historical Provider, MD  tamsulosin (FLOMAX) 0.4 MG CAPS capsule Take 0.4 mg by mouth daily after supper.  06/19/13  Yes Historical Provider, MD  Thiamine HCl (VITAMIN B-1) 250 MG tablet Take 250 mg by mouth daily.     Yes Historical Provider, MD  zolpidem (AMBIEN) 10 MG tablet Take 10 mg by mouth at bedtime as needed for sleep.   Yes Historical Provider, MD   Physical Exam: Filed Vitals:   04/09/14 0600 04/09/14 0630 04/09/14 0700 04/09/14 0800  BP: 131/64 145/80 154/76 134/70  Pulse: 29 67 68 43  Temp:      TempSrc:      Resp: 26 23 22 26   Height:      Weight:  59.1 kg (130 lb 4.7 oz)    SpO2: 80% 100% 97% 100%       General:  Obtunded on ventilator  Eyes: pupils responsive to light, not fixed ~3 mm  ENT: Intubates.    Neck: soft, neck veins slightly distended  Cardiovascular: s1 s2 tachycardic, RRR  Respiratory: rhales, coarse lung sounds  Abdomen:  Soft, drain in situ, NG output ~500,   Skin:  No lower extremity edema  Musculoskeletal:  Range of motion intact  Psychiatric:  Euthymic  Neurologic:  Unable to obtain however response to  when she was intubated patient had a gag reflex that was noted  Labs on Admission:  Basic Metabolic Panel:  Recent Labs Lab 04/06/14 0653 04/07/14 0506 04/08/14 0409 04/09/14 0427  NA 138 142 146 149*  K 5.0 5.2 4.1 3.6*  CL 102 115* 117* 118*  CO2 15* 13* 15* 13*  GLUCOSE 212* 82 77 68*  BUN 26* 33* 37* 31*  CREATININE 3.29* 3.31* 2.66* 1.89*  CALCIUM 9.2 7.7* 8.3* 8.6  MG  --  1.7 1.6 1.7   PHOS  --  4.7* 4.2 3.3   Liver Function Tests:  Recent Labs Lab 04/06/14 0653  AST 21  ALT 13  ALKPHOS 61  BILITOT 0.6  PROT 6.4  ALBUMIN 3.8    Recent Labs Lab 04/06/14 0653  LIPASE 81*   No results found for this basename: AMMONIA,  in the last 168 hours CBC:  Recent Labs Lab 04/06/14 0653 04/07/14 0506 04/07/14 2104 04/08/14 0409 04/09/14 0427  WBC 15.7* 7.2 8.1 7.5 9.0  NEUTROABS 13.2*  --   --   --   --   HGB 14.1 13.3 11.9* 11.3* 11.6*  HCT 42.1 39.7 35.6* 34.5* 35.1*  MCV 85.2  86.3 85.4 85.6 86.0  PLT 189 170 147* 144* 160   Cardiac Enzymes:  Recent Labs Lab 04/06/14 0719  TROPONINI <0.30    BNP (last 3 results) No results found for this basename: PROBNP,  in the last 8760 hours CBG: No results found for this basename: GLUCAP,  in the last 168 hours  Radiological Exams on Admission: No results found.  EKG: Independently reviewed.  Pending  Assessment/Plan    Cardiac arrest-unclear inciting event-potential metabolic and infectious[ lactic acid was 2.9 on admission][ vs. acute respiratory component.  Patient has strong history of heart disease/CAD however is also received 4 resuscitation from potential peritonitis/sepsis over 3.5 L of IV fluids.  We will repeat labs including a troponin and magnesium.  We will  get an EKG.  He currently is stable on the ventilator at present time.  We will repeat chest x-ray stat to confirm presence or absence of pneumothorax on x-ray that was done this morning. He has sinus tachycardia likely from receiving 2 doses of epinephrine but EKG shows Afib with episodic Vtach-I await troponin and have asked Cardiology to consult HIs EKG shows Runs of Vtach alternating now with new onset He had been off of his atenolol 25 daily, Diovan 80 daily since admission.  He does not require pressors currently I will empirically give him one dose of rectal aspirin  Addend-spoke with Dr. Marchelle Gearing CCM who recommends start cold  saline at 100cc.  Active Problems:    Non Oliguric Acute kidney injury with metabolic acidosishypernatreami, anion gap=18 this am.  We repeat labs. He was getting bicarbonate replacement /LR-further management as per Nephrology to adjust the same    Hypoglycemia-monitor CBGs every 4 hours as n.p.o. and on   Coronary atherosclerosis of native coronary artery-we will consider cardiac evaluation later on this admission deendant on EKG and results   Rheumatoid arthritis, adult-currently stable   Upper GI bleed-see above discussion   Perforated peptic ulcer-further management per surgeon Dr. Lovell Sheehan  I called and spoke to  his daughter Angelica Chessman at [phone] and she states that patient is no CODE BLUE and he would not have wanted to be resuscitated or have further aggressive medical management but requests that we keep him intubated until she can get her from [place] and is considering terminal wean once patient gets her period We will respect her wishes and proceed as such. We will keep medications and all other supportive management going for right now until that time I expect a very poor prognosis ultimately  This patient has multiple life threatening medical conditions and required 68 minutes of dedicated critical care time and consult from multiple sub specialists as well as multiple life sustaining and necessary invasive interventions to resuscitate him.  Rhetta Mura [facility] Pager [phone]  If 7PM-7AM, please contact night-coverage [email or web address] Password [facility] 04/09/2014, 9:32 AM

## 2014-04-09 NOTE — Progress Notes (Addendum)
Call from hospitalist Dr Mahala Menghini at [facility] to [facility] pager at [facility]  Multiple Medical Problemswith perf peptic ucler with plap 04/06/14 and 04/09/2014 V tach arrest with Short duration NOS CPR/ shock/Epi x 2 with nromal BP ROSC and possible coma per Dr Mahala Menghini  [facility] Hawking starting cold saline per my rec.  Move from [facility] to 2H; eval for [place] once arrives at [facility] Dr Mahala Menghini will clarify goals of care / code status with family - dtr driving from [facility] (apparently patient had DNR reversed for emergency p- lap)   Dr. Kalman Shan, M.D., [facility].C.P Pulmonary and Critical Care Medicine Staff Physician [place] [facility] [place] [facility] Pager: [phone], If no answer or between  15:00h - 7:00h: call [phone]  04/09/2014 10:32 AM  .................... Update 10:38 AM  - Dr Mahala Menghini called to inform patient family wants terminal wean - so cancel transfer - care link informed  Dr. Kalman Shan, M.D., [facility].C.P Pulmonary and Critical Care Medicine Staff Physician [place] [facility] [place] [facility] Critical [facility] Pager: [phone], If no answer or between  15:00h - 7:00h: call [phone]  04/09/2014 10:38 AM

## 2014-04-09 NOTE — Progress Notes (Signed)
CRITICAL VALUE ALERT  Critical value received:  Troponin 8  Date of notification:  Was not called. Discovered in Epic.  Time of notification:  Resulted at 1703. Read bu RN at [phone]  Critical value read back:n/a  Nurse who received alert:  Lovena Neighbours RN  MD notified (1st page):  Dr Claiborne Billings   Time of first page:  1910  Notified Dr Claiborne Billings by phone. Dr asked me to notify Cardiology. [facility] on call for Cardiology also notified by phone of elevated troponin. No further intervention ordered at this time. Per Dr Verna Czech note, Cardiology, transfer and interventions were discussed with family, and they do not want this at this time.

## 2014-04-09 NOTE — Significant Event (Signed)
Labs reviewed   Primary metabolic acidosis likely from Spesis, with increased anion gap, with superimposed respiratory alkalosis  (expected Pco2 = 23 - 27)  expected pH = 7.34 expected CO2 = 22 expected HCO3- = 10   I discussed with Dr. Hinda Lenis a plan to formulate some way of managing both his possible global  HF [echo pending] as well as his met acidosis and hypernatremia   Will increase rate of IV bicarb gtt, as increaing concentration of fluid would cause increae in tonicity and worsen Hypernatremia-will also give along with IVF bid IV lasix 60.    Verneita Griffes, MD [facility] [phone]

## 2014-04-09 NOTE — Progress Notes (Signed)
Events of CODE BLUE reviewed. Appreciate hospitalists input and care. Appears to be primary cardiac event.

## 2014-04-09 NOTE — Progress Notes (Signed)
Subjective: Interval History: Patient offers no complaint. His abdominal pain is better but does not pass any gas .Denies any difficulty in breathing.  Objective: Vital signs in last 24 hours: Temp:  [97.5 F (36.4 C)-98.7 F (37.1 C)] 98.7 F (37.1 C) (09/04 0400) Pulse Rate:  [29-100] 43 (09/04 0800) Resp:  [17-29] 26 (09/04 0800) BP: (123-158)/(49-94) 134/70 mmHg (09/04 0800) SpO2:  [80 %-100 %] 100 % (09/04 0800) Weight:  [59.1 kg (130 lb 4.7 oz)] 59.1 kg (130 lb 4.7 oz) (09/04 0630) Weight change: 0.8 kg (1 lb 12.2 oz)  Intake/Output from previous day: 09/03 0701 - 09/04 0700 In: 4685 [I.V.:3420; NG/GT:350; IV Piggyback:150] Out: 1385 [Urine:700; Emesis/NG output:500; Drains:185] Intake/Output this shift:    General appearance: alert, cooperative and no distress Resp: clear to auscultation bilaterally Cardio: regular rate and rhythm, S1, S2 normal, no murmur, click, rub or gallop GI: hypoactive Extremities: extremities normal, atraumatic, no cyanosis or edema  Lab Results:  Recent Labs  04/08/14 0409 04/09/14 0427  WBC 7.5 9.0  HGB 11.3* 11.6*  HCT 34.5* 35.1*  PLT 144* 160   BMET:   Recent Labs  04/08/14 0409 04/09/14 0427  NA 146 149*  K 4.1 3.6*  CL 117* 118*  CO2 15* 13*  GLUCOSE 77 68*  BUN 37* 31*  CREATININE 2.66* 1.89*  CALCIUM 8.3* 8.6   No results found for this basename: PTH,  in the last 72 hours Iron Studies: No results found for this basename: IRON, TIBC, TRANSFERRIN, FERRITIN,  in the last 72 hours  Studies/Results: No results found.  I have reviewed the patient's current medications.  Assessment/Plan: Problem #1 acute kidney injury: His BUN is 31 and his  creatinine is 1.89 presently his renal function seems to be improving. He remains none oliguric Problem #2 status post surgery for perforated ulcer Problem #3  metabolic acidosis: CO2 is 13 low and declining Problem #4 metabolic bone disease: His calcium and phosphorus is  range Problem #5 history of chronic kidney disease stage III. Problem #6 anemia: His hemoglobin slightly low but stable. Problem #7 hypertension: His blood pressure is reasonably controlled. Problem#8 Hypernatremia : Sodium is increasing Plan: We'll d/c normal saline and lactated  ringers solution . Start on 1/4 saline with 50 meq of sodium bicarbonate at 135 cc/hr We'll check his basic metabolic panel in the morning.   LOS: 3 days   Daira Hine S 04/09/2014,8:40 AM

## 2014-04-09 NOTE — Progress Notes (Signed)
ANTIBIOTIC CONSULT NOTE  Pharmacy Consult for Zosyn Indication: intra-abdominal infection  No Known Allergies  Patient Measurements: Height: 5' 6.5" (168.9 cm) Weight: 130 lb 4.7 oz (59.1 kg) IBW/kg (Calculated) : 64.95  Vital Signs: Temp: 98.7 F (37.1 C) (09/04 0400) Temp src: Axillary (09/04 0400) BP: 145/80 mmHg (09/04 0630) Pulse Rate: 67 (09/04 0630) Intake/Output from previous day: 09/03 0701 - 09/04 0700 In: 4685 [I.V.:3420; NG/GT:350; IV Piggyback:150] Out: 1385 [Urine:700; Emesis/NG output:500; Drains:185] Intake/Output from this shift:    Labs:  Recent Labs  04/07/14 0506 04/07/14 1018 04/07/14 2104 04/08/14 0409 04/09/14 0427  WBC 7.2  --  8.1 7.5 9.0  HGB 13.3  --  11.9* 11.3* 11.6*  PLT 170  --  147* 144* 160  LABCREA  --  66.09  --   --   --   CREATININE 3.31*  --   --  2.66* 1.89*   Estimated Creatinine Clearance: 26.5 ml/min (by C-G formula based on Cr of 1.89). No results found for this basename: VANCOTROUGH, Leodis Binet, VANCORANDOM, GENTTROUGH, GENTPEAK, GENTRANDOM, TOBRATROUGH, TOBRAPEAK, TOBRARND, AMIKACINPEAK, AMIKACINTROU, AMIKACIN,  in the last 72 hours   Microbiology: Recent Results (from the past 720 hour(s))  MRSA PCR SCREENING     Status: None   Collection Time    04/06/14  2:13 PM      Result Value Ref Range Status   MRSA by PCR NEGATIVE  NEGATIVE Final   Comment:            The GeneXpert MRSA Assay (FDA     approved for NASAL specimens     only), is one component of a     comprehensive MRSA colonization     surveillance program. It is not     intended to diagnose MRSA     infection nor to guide or     monitor treatment for     MRSA infections.    Medical History: Past Medical History  Diagnosis Date  . Coronary atherosclerosis of native coronary artery     Multivessel  . Essential hypertension, benign   . Mixed hyperlipidemia   . Erectile dysfunction   . Osteoarthritis   . Myocardial infarction     IMI 1987 and PLMI  1997  . Rheumatoid arthritis, adult     Anti-infectives   Start     Dose/Rate Route Frequency Ordered Stop   04/06/14 1800  piperacillin-tazobactam (ZOSYN) IVPB 2.25 g     2.25 g 100 mL/hr over 30 Minutes Intravenous Every 8 hours 04/06/14 1458     04/06/14 1600  piperacillin-tazobactam (ZOSYN) IVPB 3.375 g  Status:  Discontinued     3.375 g 12.5 mL/hr over 240 Minutes Intravenous Every 8 hours 04/06/14 1438 04/06/14 1453   04/06/14 0930  piperacillin-tazobactam (ZOSYN) IVPB 3.375 g     3.375 g 100 mL/hr over 30 Minutes Intravenous  Once 04/06/14 0258 04/06/14 1036      Assessment: 81yo male admitted with c/o severe abdominal pain found to have peritonitis.  He is s/p repair of perforated peptic ulcer on 04/06/14.   He had acute renal failure on admission which is improving.  Estimated CrCl now >21ml/min.  Zosyn 9/1>>  Goal of Therapy:  Eradicate infection.  Plan:  Increase Zosyn 3.375gm IV Q8h to be infused over 4hrs Monitor labs, renal fxn, progress, and cultures  Camira Geidel, [facility] 04/09/2014,8:27 AM

## 2014-04-09 NOTE — Significant Event (Addendum)
Patient re-assessed and re-examined Now making some meaningful movements with hands and legs and was given ativan for some agitation on ventilator.  Family bedside BP 145/72  Pulse 132  Temp(Src) 98.7 F (37.1 C) (Axillary)  Resp 33  Ht 5' 6.5" (1.689 m)  Wt 59.1 kg (130 lb 4.7 oz)  BMI 20.72 kg/m2  SpO2 99% HR has come down to some extent-PVC's have resolved and this retrospectively could have been 2/2 to Prssors given during ROSC I will get a blood gas and will cycle troponin further-although this was likely 2/2 to CPR. We will call carduiology dependant on if there is a significant change in status-we will obtain an echocardiogram in the interim We will rpt EKG in am.  If needed we will then discuss further care with family and dependant on clinical status-either decline or improvement in condition, will discuss with family goals of care as well as either further aggressive medical therapy or DNR state.   Pleas Koch, MD [facility] [phone]

## 2014-04-09 NOTE — Progress Notes (Signed)
ANTIBIOTIC CONSULT NOTE  Pharmacy Consult for Zosyn & Vancomycin Indication: intra-abdominal infection, possible aspiration PNA, sepsis  No Known Allergies  Patient Measurements: Height: 5' 6.5" (168.9 cm) Weight: 130 lb 4.7 oz (59.1 kg) IBW/kg (Calculated) : 64.95  Vital Signs: Temp: 98.7 F (37.1 C) (09/04 0400) Temp src: Axillary (09/04 0400) BP: 110/58 mmHg (09/04 1245) Pulse Rate: 135 (09/04 1245) Intake/Output from previous day: 09/03 0701 - 09/04 0700 In: 4955 [I.V.:3690; NG/GT:350; IV Piggyback:150] Out: 1385 [Urine:700; Emesis/NG output:500; Drains:185] Intake/Output from this shift: Total I/O In: 590 [I.V.:540; IV Piggyback:50] Out: 220 [Urine:200; Drains:20]  Labs:  Recent Labs  04/07/14 0506 04/07/14 1018  04/08/14 0409 04/09/14 0427 04/09/14 0947  WBC 7.2  --   < > 7.5 9.0 13.4*  HGB 13.3  --   < > 11.3* 11.6* 12.6*  PLT 170  --   < > 144* 160 178  LABCREA  --  66.09  --   --   --   --   CREATININE 3.31*  --   --  2.66* 1.89* 1.80*  < > = values in this interval not displayed. Estimated Creatinine Clearance: 27.8 ml/min (by C-G formula based on Cr of 1.8). No results found for this basename: VANCOTROUGH, Leodis Binet, VANCORANDOM, GENTTROUGH, GENTPEAK, GENTRANDOM, TOBRATROUGH, TOBRAPEAK, TOBRARND, AMIKACINPEAK, AMIKACINTROU, AMIKACIN,  in the last 72 hours   Microbiology: Recent Results (from the past 720 hour(s))  MRSA PCR SCREENING     Status: None   Collection Time    04/06/14  2:13 PM      Result Value Ref Range Status   MRSA by PCR NEGATIVE  NEGATIVE Final   Comment:            The GeneXpert MRSA Assay (FDA     approved for NASAL specimens     only), is one component of a     comprehensive MRSA colonization     surveillance program. It is not     intended to diagnose MRSA     infection nor to guide or     monitor treatment for     MRSA infections.    Medical History: Past Medical History  Diagnosis Date  . Coronary atherosclerosis of  native coronary artery     Multivessel  . Essential hypertension, benign   . Mixed hyperlipidemia   . Erectile dysfunction   . Osteoarthritis   . Myocardial infarction     IMI 1987 and PLMI 1997  . Rheumatoid arthritis, adult     Anti-infectives   Start     Dose/Rate Route Frequency Ordered Stop   04/10/14 1230  vancomycin (VANCOCIN) 500 mg in sodium chloride 0.9 % 100 mL IVPB     500 mg 100 mL/hr over 60 Minutes Intravenous Every 24 hours 04/09/14 1217     04/09/14 1230  vancomycin (VANCOCIN) IVPB 750 mg/150 ml premix     750 mg 150 mL/hr over 60 Minutes Intravenous  Once 04/09/14 1217     04/09/14 1000  piperacillin-tazobactam (ZOSYN) IVPB 3.375 g     3.375 g 12.5 mL/hr over 240 Minutes Intravenous Every 8 hours 04/09/14 0831     04/06/14 1800  piperacillin-tazobactam (ZOSYN) IVPB 2.25 g  Status:  Discontinued     2.25 g 100 mL/hr over 30 Minutes Intravenous Every 8 hours 04/06/14 1458 04/09/14 0831   04/06/14 1600  piperacillin-tazobactam (ZOSYN) IVPB 3.375 g  Status:  Discontinued     3.375 g 12.5 mL/hr over 240 Minutes Intravenous Every 8  hours 04/06/14 1438 04/06/14 1453   04/06/14 0930  piperacillin-tazobactam (ZOSYN) IVPB 3.375 g     3.375 g 100 mL/hr over 30 Minutes Intravenous  Once 04/06/14 4696 04/06/14 1036      Assessment: 81yo male admitted with c/o severe abdominal pain found to have peritonitis.  He is s/p repair of perforated peptic ulcer on 04/06/14.   He had episode of unresponsiveness with Vtach today (04/09/14).  Code BLUE was called & was intubated.  WBC is now elevated.  CXR shows possible infiltrate.  Cx data pending. He had acute renal failure on admission which is improving.  Estimated CrCl now >77ml/min.  Zosyn 9/1>>  Goal of Therapy:  Eradicate infection.  Plan:  Add Vancomycin 750mg  IV x1 now then 500mg  IV q24h Check Vancomycin trough at steady state Continue Zosyn 3.375gm IV Q8h to be infused over 4hrs Monitor labs, renal fxn, progress, and  cultures  Yama Nielson, 04/09/2014,1:11 PM

## 2014-04-09 NOTE — Progress Notes (Signed)
  Echocardiogram 2D Echocardiogram has been performed.  Arthea Nobel 04/09/2014, 4:22 PM

## 2014-04-09 NOTE — Consult Note (Signed)
Primary cardiologist: Dr Simona Huh MD Consulting cardiologist: Dr Dina Rich MD  Clinical Summary [name] is a 80 y.o.male hx of CAD with prior CABG in 1987 (from notes prior MI in 1987 and 1997 however do not see cath reports), HTN, HL, admitted with abdominal pain. CT scan showed peritonitis and pneumoperitoneum. Taken to surgery 04/06/14 for exploratory laparotomy, found to have anterior perforation of the first portion of the duodenum/perforated peptic ulcer, this was surgically repaired. AKI this admit with Cr 3.29 that has been trending down, currently 1.80.     Mg 1.7, K 3.6, Cr 1.89, BUN 31, anion gap 18, Hgb 11.6, Plt 160, CO2 13 AG 18. Trop 4.48  Lactic acid was 2.9 on admit ABG 7.32/21/270 after code/intubation CXR bibasilar opacities R>L EKG low voltage, afib with aberrant conduction  Telemetry strips at 9:16 57 sec shows SR acutely degrading into monomorphic VT. He was defibrilalted x 1, received epi x3 and intubated. Coded total of 8 minutes  Patient experienced VT arrest this AM, reported short duration of CPR with successful defibrillation. After defib developed afib with aberrancy, now sinus tachycardia. Repeat EKG difficult interpretation due to low voltage, latest at 230pm shows sinus tach with occas PVCs, anteroseptal Q-waves without acute ischemic changes.    From notes patient was initially DNR upon admission, however this was changed upon admission. From notes remote critical care assessment recommended [facility] transfer with consideration for therapeutic hypothermia, however family did not want transfer.   No Known Allergies  Medications Scheduled Medications: . antiseptic oral rinse  7 mL Mouth Rinse BID  . antiseptic oral rinse  7 mL Mouth Rinse q12n4p  . chlorhexidine  15 mL Mouth Rinse BID  . DOPamine      . metoprolol      . metoprolol  2.5 mg Intravenous 4 times per day  . pantoprazole (PROTONIX) IV  40 mg Intravenous Daily  .  piperacillin-tazobactam (ZOSYN)  IV  3.375 g Intravenous Q8H  . vancomycin  750 mg Intravenous Once   Followed by  . [START ON 04/10/2014] vancomycin  500 mg Intravenous Q24H     Infusions: .  sodium bicarbonate infusion 1/4 NS 1000 mL 135 mL/hr at 04/09/14 1200     PRN Medications:  fentaNYL, fentaNYL, LORazepam, morphine injection, ondansetron (ZOFRAN) IV   Past Medical History  Diagnosis Date  . Coronary atherosclerosis of native coronary artery     Multivessel  . Essential hypertension, benign   . Mixed hyperlipidemia   . Erectile dysfunction   . Osteoarthritis   . Myocardial infarction     IMI 1987 and PLMI 1997  . Rheumatoid arthritis, adult     Past Surgical History  Procedure Laterality Date  . Coronary artery bypass graft  1987  . Carpal tunnel release    . Rotator cuff repair    . Neck surgery    . Esophagogastroduodenoscopy N/A 01/23/2013    Procedure: ESOPHAGOGASTRODUODENOSCOPY (EGD);  Surgeon: Shirley Friar, MD;  Location[facility] ENDOSCOPY;  Service: Endoscopy;  Laterality: N/A;  . Esophagogastroduodenoscopy N/A 01/23/2013    Procedure: ESOPHAGOGASTRODUODENOSCOPY (EGD);  Surgeon: Shirley Friar, MD;  Location: [facility] ENDOSCOPY;  Service: Endoscopy;  Laterality: N/A;  . Laparotomy N/A 04/06/2014    Procedure: EXPLORATORY LAPAROTOMY, Lucious Groves;  Surgeon: Dalia Heading, MD;  Location: [facility] ORS;  Service: General;  Laterality: N/A;  . Central venous catheter insertion Left 04/06/2014    Procedure: INSERTION CENTRAL LINE ADULT;  Surgeon: Dalia Heading,  MD;  Location: [facility] ORS;  Service: General;  Laterality: Left;    Family History  Problem Relation Age of Onset  . Coronary artery disease Father     Died age 49 with MI  . Diabetes type II Sister     Social History [name] reports that he quit smoking about 2 years ago. His smoking use included Cigarettes. He has a 15 pack-year smoking history. He has never used smokeless tobacco. [name] reports  that he does not drink alcohol.  Review of Systems Unable to collect ROS, patient intubated and sedated.      Physical Examination Blood pressure 114/61, pulse 25, temperature 98.7 F (37.1 C), temperature source Axillary, resp. rate 25, height 5' 6.5" (1.689 m), weight 130 lb 4.7 oz (59.1 kg), SpO2 69.00%.  Intake/Output Summary (Last 24 hours) at 04/09/14 1354 Last data filed at 04/09/14 1200  Gross per 24 hour  Intake   4370 ml  Output   1340 ml  Net   3030 ml    Cardiovascular: RRR, no m/r/g, no JVD  Respiratory: clear anteriorally  GI: abdomen ND  MSK: LEs are warm, no edema  Neuro: sedated  Psych: unable to address, sedated   Lab Results  Basic Metabolic Panel:  Recent Labs Lab 04/06/14 0653 04/07/14 0506 04/08/14 0409 04/09/14 0427 04/09/14 0947  NA 138 142 146 149* 150*  K 5.0 5.2 4.1 3.6* 3.3*  CL 102 115* 117* 118* 118*  CO2 15* 13* 15* 13* 11*  GLUCOSE 212* 82 77 68* 85  BUN 26* 33* 37* 31* 30*  CREATININE 3.29* 3.31* 2.66* 1.89* 1.80*  CALCIUM 9.2 7.7* 8.3* 8.6 8.7  MG  --  1.7 1.6 1.7  --   PHOS  --  4.7* 4.2 3.3  --     Liver Function Tests:  Recent Labs Lab 04/06/14 0653 04/09/14 0947  AST 21 41*  ALT 13 26  ALKPHOS 61 48  BILITOT 0.6 0.6  PROT 6.4 5.2*  ALBUMIN 3.8 2.2*    CBC:  Recent Labs Lab 04/06/14 0653 04/07/14 0506 04/07/14 2104 04/08/14 0409 04/09/14 0427 04/09/14 0947  WBC 15.7* 7.2 8.1 7.5 9.0 13.4*  NEUTROABS 13.2*  --   --   --   --  10.4*  HGB 14.1 13.3 11.9* 11.3* 11.6* 12.6*  HCT 42.1 39.7 35.6* 34.5* 35.1* 38.6*  MCV 85.2 86.3 85.4 85.6 86.0 87.3  PLT 189 170 147* 144* 160 178    Cardiac Enzymes:  Recent Labs Lab 04/06/14 0719 04/09/14 0947 04/09/14 1220  TROPONINI <0.30 2.81* 4.48*    BNP: No components found with this basename: POCBNP,    ECG   Imaging 03/2013 Echo Study Conclusions  - Left ventricle: The cavity size was normal. There was mild concentric hypertrophy. Systolic  function was normal. The estimated ejection fraction was in the range of 50% to 55%. There is akinesis of the basal-midinferolateral myocardium. There is akinesis of the distalinferolateral and apical myocardium. Doppler parameters are consistent with abnormal left ventricular relaxation (grade 1 diastolic dysfunction). - Aortic valve: Mildly to moderately calcified annulus. Trileaflet; mildly thickened leaflets. No significant regurgitation. - Mitral valve: Calcified annulus. Mild regurgitation. - Left atrium: The atrium was mildly dilated. - Right atrium: Central venous pressure: 6mm Hg (est). - Tricuspid valve: Trivial regurgitation. - Pulmonary arteries: Systolic pressure could not be accurately estimated. - Pericardium, extracardiac: There was no pericardial effusion. Impressions:  - Unable to compare with remote study. Mild LVH with LVEF 50-55%,  wall motion abnormalties as described consistent with ischemic heart disease. Grade 1 diastolic dysfunction, mild left atrial enlargement. MAC with mild mitral regurgitation.    Impression/Recommendations  1. VT arrest - unclear primary etiology, patient with multiple metabolic derangements including severe metabolic acidosis and intraabdominal infection with sepsis - known CAD hx with prior CABG puts at risk for primary cardiac event. His EKG post event shows no acute ischemic changes, evidence of old anteroseptal MI making acute MI less likely. Prelim echo read shows global hypokinesis as opposed to focal wall motion abnormality, likely due to global hypoperfusion in setting of cardiac arrest - he is currently hemodynamically stable off pressors, would also suspect if acute MI with VT arrest he would have developed persistent cardiogenic shock - discussed management options with family in detail Potential cath would be complicated by his AKI and recent abdominal surgery. They are not in favor at this time, nor are they in favor of  transfer to [facility] at this time.   - continue medical therapy, will start amiodarone for secondary prevention. Primary team to continue working on metabolic derangements. Consider repeat lactic acid given his persistent metabolic acidosis - vent support per pulmonary - will f/u official report of echo. Would start ASA and high dose statin. - keep K at 4 and Mg at 2, will write for K IV   Dina Rich, M.D., F.A.C.C.

## 2014-04-10 ENCOUNTER — Inpatient Hospital Stay (HOSPITAL_COMMUNITY): Payer: Medicare Other

## 2014-04-10 LAB — BLOOD GAS, ARTERIAL
ACID-BASE DEFICIT: 6.4 mmol/L — AB (ref 0.0–2.0)
BICARBONATE: 17.2 meq/L — AB (ref 20.0–24.0)
Drawn by: 105551
FIO2: 0.4 %
O2 Saturation: 98.3 %
PCO2 ART: 27.2 mmHg — AB (ref 35.0–45.0)
PEEP: 5 cmH2O
Patient temperature: 37
RATE: 18 resp/min
TCO2: 15.2 mmol/L (ref 0–100)
VT: 500 mL
pH, Arterial: 7.418 (ref 7.350–7.450)
pO2, Arterial: 121 mmHg — ABNORMAL HIGH (ref 80.0–100.0)

## 2014-04-10 LAB — MAGNESIUM: Magnesium: 1.7 mg/dL (ref 1.5–2.5)

## 2014-04-10 LAB — BASIC METABOLIC PANEL
ANION GAP: 17 — AB (ref 5–15)
BUN: 28 mg/dL — ABNORMAL HIGH (ref 6–23)
CHLORIDE: 109 meq/L (ref 96–112)
CO2: 19 meq/L (ref 19–32)
Calcium: 8.3 mg/dL — ABNORMAL LOW (ref 8.4–10.5)
Creatinine, Ser: 1.63 mg/dL — ABNORMAL HIGH (ref 0.50–1.35)
GFR calc Af Amer: 45 mL/min — ABNORMAL LOW (ref >90)
GFR calc non Af Amer: 38 mL/min — ABNORMAL LOW (ref >90)
Glucose, Bld: 136 mg/dL — ABNORMAL HIGH (ref 70–99)
Potassium: 2.7 mEq/L — CL (ref 3.7–5.3)
Sodium: 145 mEq/L (ref 137–147)

## 2014-04-10 LAB — LACTIC ACID, PLASMA: LACTIC ACID, VENOUS: 1.4 mmol/L (ref 0.5–2.2)

## 2014-04-10 LAB — TROPONIN I: Troponin I: 9.25 ng/mL (ref <0.30)

## 2014-04-10 MED ORDER — FENTANYL CITRATE 0.05 MG/ML IJ SOLN
INTRAMUSCULAR | Status: AC
  Filled 2014-04-10: qty 50

## 2014-04-10 MED ORDER — FUROSEMIDE 10 MG/ML IJ SOLN: 40.0000 mg | Freq: Two times a day (BID) | INTRAMUSCULAR | Status: DC

## 2014-04-10 MED ORDER — FENTANYL BOLUS VIA INFUSION
25.0000 ug | INTRAVENOUS | Status: DC | PRN
  Filled 2014-04-10: qty 50

## 2014-04-10 MED ORDER — SODIUM CHLORIDE 0.9 % IV SOLN
0.0000 ug/h | INTRAVENOUS | Status: DC
  Administered 2014-04-10: 25 ug/h via INTRAVENOUS
  Filled 2014-04-10 (×2): qty 50

## 2014-04-10 MED ORDER — POTASSIUM CHLORIDE 2 MEQ/ML IV SOLN
INTRAVENOUS | Status: DC
  Administered 2014-04-10 – 2014-04-12 (×4): via INTRAVENOUS
  Filled 2014-04-10 (×7): qty 1000

## 2014-04-10 MED ORDER — POTASSIUM CHLORIDE CRYS ER 20 MEQ PO TBCR: 40.0000 meq | EXTENDED_RELEASE_TABLET | Freq: Every day | ORAL | Status: DC

## 2014-04-10 MED ORDER — METOPROLOL TARTRATE 1 MG/ML IV SOLN: 2.5000 mg | Freq: Four times a day (QID) | INTRAVENOUS | Status: DC | PRN

## 2014-04-10 MED ORDER — POTASSIUM CHLORIDE 10 MEQ/100ML IV SOLN
10.0000 meq | INTRAVENOUS | Status: AC
  Administered 2014-04-10 (×4): 10 meq via INTRAVENOUS
  Filled 2014-04-10 (×4): qty 100

## 2014-04-10 MED ORDER — FUROSEMIDE 10 MG/ML IJ SOLN
20.0000 mg | Freq: Two times a day (BID) | INTRAMUSCULAR | Status: DC
  Administered 2014-04-10 – 2014-04-11 (×2): 20 mg via INTRAVENOUS
  Filled 2014-04-10 (×2): qty 2

## 2014-04-10 NOTE — Progress Notes (Addendum)
Pt heart rhythm changed to ventricular rhythm for about 20 seconds and then returned to NSR/AVblock. Pt responded to touch during rhythm change and did not seem in distress. MD is aware. Will continue to monitor.

## 2014-04-10 NOTE — Progress Notes (Signed)
Subjective: He remains intubated on the ventilator. He is on multiple continuous infusions including bicarbonate amiodarone fentanyl. He is now down to 40% oxygen with a respiratory rate on the ventilator of 18. He does become agitated when his sedation is reduced. He has positive troponin  Objective: Vital signs in last 24 hours: Temp:  [97.2 F (36.2 C)-97.6 F (36.4 C)] 97.2 F (36.2 C) (09/05 0008) Pulse Rate:  [25-160] 69 (09/05 0815) Resp:  [16-36] 19 (09/05 0815) BP: (78-187)/(43-126) 136/67 mmHg (09/05 0815) SpO2:  [52 %-100 %] 100 % (09/05 0815) FiO2 (%):  [40 %-100 %] 40 % (09/05 0803) Weight change:  Last BM Date:  (unknown')  Intake/Output from previous day: 09/04 0701 - 09/05 0700 In: 3939 [I.V.:3289; IV Piggyback:650] Out: 3382 [Urine:4300; Emesis/NG output:25; Drains:40]  PHYSICAL EXAM General appearance: Intubated sedated on mechanical ventilation Resp: rhonchi bilaterally Cardio: His heart seems regular at this time. No gallop. GI: He's had recent abdominal surgery. Extremities: extremities normal, atraumatic, no cyanosis or edema  Lab Results:  Results for orders placed during the hospital encounter of 04/06/14 (from the past 48 hour(s))  CBC     Status: Abnormal   Collection Time    04/09/14  4:27 AM      Result Value Ref Range   WBC 9.0  4.0 - 10.5 K/uL   RBC 4.08 (*) 4.22 - 5.81 MIL/uL   Hemoglobin 11.6 (*) 13.0 - 17.0 g/dL   HCT 35.1 (*) 39.0 - 52.0 %   MCV 86.0  78.0 - 100.0 fL   MCH 28.4  26.0 - 34.0 pg   MCHC 33.0  30.0 - 36.0 g/dL   RDW 17.1 (*) 11.5 - 15.5 %   Platelets 160  150 - 400 K/uL  BASIC METABOLIC PANEL     Status: Abnormal   Collection Time    04/09/14  4:27 AM      Result Value Ref Range   Sodium 149 (*) 137 - 147 mEq/L   Potassium 3.6 (*) 3.7 - 5.3 mEq/L   Chloride 118 (*) 96 - 112 mEq/L   CO2 13 (*) 19 - 32 mEq/L   Glucose, Bld 68 (*) 70 - 99 mg/dL   BUN 31 (*) 6 - 23 mg/dL   Creatinine, Ser 1.89 (*) 0.50 - 1.35 mg/dL    Calcium 8.6  8.4 - 10.5 mg/dL   GFR calc non Af Amer 32 (*) >90 mL/min   GFR calc Af Amer 37 (*) >90 mL/min   Comment: (NOTE)     The eGFR has been calculated using the CKD EPI equation.     This calculation has not been validated in all clinical situations.     eGFR's persistently <90 mL/min signify possible Chronic Kidney     Disease.   Anion gap 18 (*) 5 - 15  MAGNESIUM     Status: None   Collection Time    04/09/14  4:27 AM      Result Value Ref Range   Magnesium 1.7  1.5 - 2.5 mg/dL  PHOSPHORUS     Status: None   Collection Time    04/09/14  4:27 AM      Result Value Ref Range   Phosphorus 3.3  2.3 - 4.6 mg/dL  COMPREHENSIVE METABOLIC PANEL     Status: Abnormal   Collection Time    04/09/14  9:47 AM      Result Value Ref Range   Sodium 150 (*) 137 - 147 mEq/L  Potassium 3.3 (*) 3.7 - 5.3 mEq/L   Chloride 118 (*) 96 - 112 mEq/L   CO2 11 (*) 19 - 32 mEq/L   Glucose, Bld 85  70 - 99 mg/dL   BUN 30 (*) 6 - 23 mg/dL   Creatinine, Ser 1.80 (*) 0.50 - 1.35 mg/dL   Calcium 8.7  8.4 - 10.5 mg/dL   Total Protein 5.2 (*) 6.0 - 8.3 g/dL   Albumin 2.2 (*) 3.5 - 5.2 g/dL   AST 41 (*) 0 - 37 U/L   ALT 26  0 - 53 U/L   Alkaline Phosphatase 48  39 - 117 U/L   Total Bilirubin 0.6  0.3 - 1.2 mg/dL   GFR calc non Af Amer 34 (*) >90 mL/min   GFR calc Af Amer 40 (*) >90 mL/min   Comment: (NOTE)     The eGFR has been calculated using the CKD EPI equation.     This calculation has not been validated in all clinical situations.     eGFR's persistently <90 mL/min signify possible Chronic Kidney     Disease.  PROTIME-INR     Status: Abnormal   Collection Time    04/09/14  9:47 AM      Result Value Ref Range   Prothrombin Time 15.6 (*) 11.6 - 15.2 seconds   INR 1.24  0.00 - 1.49  TROPONIN I     Status: Abnormal   Collection Time    04/09/14  9:47 AM      Result Value Ref Range   Troponin I 2.81 (*) <0.30 ng/mL   Comment: CRITICAL RESULT CALLED TO, READ BACK BY AND VERIFIED WITH:      SHENOWITZ,L AT 10:40AM ON 04/09/14 BY FESTERMAN,C                Due to the release kinetics of cTnI,     a negative result within the first hours     of the onset of symptoms does not rule out     myocardial infarction with certainty.     If myocardial infarction is still suspected,     repeat the test at appropriate intervals.  CBC WITH DIFFERENTIAL     Status: Abnormal   Collection Time    04/09/14  9:47 AM      Result Value Ref Range   WBC 13.4 (*) 4.0 - 10.5 K/uL   RBC 4.42  4.22 - 5.81 MIL/uL   Hemoglobin 12.6 (*) 13.0 - 17.0 g/dL   HCT 38.6 (*) 39.0 - 52.0 %   MCV 87.3  78.0 - 100.0 fL   MCH 28.5  26.0 - 34.0 pg   MCHC 32.6  30.0 - 36.0 g/dL   RDW 17.2 (*) 11.5 - 15.5 %   Platelets 178  150 - 400 K/uL   Neutrophils Relative % 78 (*) 43 - 77 %   Neutro Abs 10.4 (*) 1.7 - 7.7 K/uL   Lymphocytes Relative 16  12 - 46 %   Lymphs Abs 2.2  0.7 - 4.0 K/uL   Monocytes Relative 6  3 - 12 %   Monocytes Absolute 0.8  0.1 - 1.0 K/uL   Eosinophils Relative 0  0 - 5 %   Eosinophils Absolute 0.0  0.0 - 0.7 K/uL   Basophils Relative 0  0 - 1 %   Basophils Absolute 0.0  0.0 - 0.1 K/uL  TROPONIN I     Status: Abnormal   Collection Time  04/09/14 12:20 PM      Result Value Ref Range   Troponin I 4.48 (*) <0.30 ng/mL   Comment:            Due to the release kinetics of cTnI,     a negative result within the first hours     of the onset of symptoms does not rule out     myocardial infarction with certainty.     If myocardial infarction is still suspected,     repeat the test at appropriate intervals.     CRITICAL RESULT CALLED TO, READ BACK BY AND VERIFIED WITH:     PHILLIPS,C AT 1345 BY GODFREY,O ON 04/09/14  BLOOD GAS, ARTERIAL     Status: Abnormal   Collection Time    04/09/14 12:20 PM      Result Value Ref Range   FIO2 100.00     Delivery systems VENTILATOR     Mode PRESSURE REGULATED VOLUME CONTROL     VT 500     Rate 18     Peep/cpap 5.0     pH, Arterial 7.326 (*) 7.350  - 7.450   pCO2 arterial 21.2 (*) 35.0 - 45.0 mmHg   pO2, Arterial 270.0 (*) 80.0 - 100.0 mmHg   Bicarbonate 10.7 (*) 20.0 - 24.0 mEq/L   TCO2 9.9  0 - 100 mmol/L   Acid-base deficit 14.2 (*) 0.0 - 2.0 mmol/L   O2 Saturation 99.3     Patient temperature 37.0     Collection site LEFT BRACHIAL     Drawn by [id number]     Sample type ARTERIAL     Allens test (pass/fail) PASS  PASS  TROPONIN I     Status: Abnormal   Collection Time    04/09/14  4:03 PM      Result Value Ref Range   Troponin I 8.00 (*) <0.30 ng/mL   Comment:            Due to the release kinetics of cTnI,     a negative result within the first hours     of the onset of symptoms does not rule out     myocardial infarction with certainty.     If myocardial infarction is still suspected,     repeat the test at appropriate intervals.     CRITICAL RESULT CALLED TO, READ BACK BY AND VERIFIED WITH:     PHILLIPS,C AT 1702 BY GODFREY,O ON 04/09/14  TROPONIN I     Status: Abnormal   Collection Time    04/09/14 10:12 PM      Result Value Ref Range   Troponin I 11.45 (*) <0.30 ng/mL   Comment:            Due to the release kinetics of cTnI,     a negative result within the first hours     of the onset of symptoms does not rule out     myocardial infarction with certainty.     If myocardial infarction is still suspected,     repeat the test at appropriate intervals.     CRITICAL VALUE NOTED.  VALUE IS CONSISTENT WITH PREVIOUSLY REPORTED AND CALLED VALUE.  TROPONIN I     Status: Abnormal   Collection Time    04/10/14  4:36 AM      Result Value Ref Range   Troponin I 9.25 (*) <0.30 ng/mL   Comment: CRITICAL VALUE NOTED.  VALUE IS CONSISTENT WITH PREVIOUSLY REPORTED  AND CALLED VALUE.                Due to the release kinetics of cTnI,     a negative result within the first hours     of the onset of symptoms does not rule out     myocardial infarction with certainty.     If myocardial infarction is still suspected,     repeat  the test at appropriate intervals.  BASIC METABOLIC PANEL     Status: Abnormal   Collection Time    04/10/14  4:36 AM      Result Value Ref Range   Sodium 145  137 - 147 mEq/L   Potassium 2.7 (*) 3.7 - 5.3 mEq/L   Comment: DELTA CHECK NOTED     CRITICAL RESULT CALLED TO, READ BACK BY AND VERIFIED WITH:     STONE A. AT [id number] ON [id number] BY THOMPSON S.   Chloride 109  96 - 112 mEq/L   Comment: DELTA CHECK NOTED     RESULT REPEATED AND VERIFIED   CO2 19  19 - 32 mEq/L   Glucose, Bld 136 (*) 70 - 99 mg/dL   BUN 28 (*) 6 - 23 mg/dL   Creatinine, Ser 1.63 (*) 0.50 - 1.35 mg/dL   Calcium 8.3 (*) 8.4 - 10.5 mg/dL   GFR calc non Af Amer 38 (*) >90 mL/min   GFR calc Af Amer 45 (*) >90 mL/min   Comment: (NOTE)     The eGFR has been calculated using the CKD EPI equation.     This calculation has not been validated in all clinical situations.     eGFR's persistently <90 mL/min signify possible Chronic Kidney     Disease.   Anion gap 17 (*) 5 - 15  BLOOD GAS, ARTERIAL     Status: Abnormal   Collection Time    04/10/14  5:02 AM      Result Value Ref Range   FIO2 0.40     Delivery systems VENTILATOR     Mode PRESSURE REGULATED VOLUME CONTROL     VT 500     Rate 18     Peep/cpap 5.0     pH, Arterial 7.418  7.350 - 7.450   pCO2 arterial 27.2 (*) 35.0 - 45.0 mmHg   pO2, Arterial 121.0 (*) 80.0 - 100.0 mmHg   Bicarbonate 17.2 (*) 20.0 - 24.0 mEq/L   TCO2 15.2  0 - 100 mmol/L   Acid-base deficit 6.4 (*) 0.0 - 2.0 mmol/L   O2 Saturation 98.3     Patient temperature 37.0     Collection site BRACHIAL ARTERY     Drawn by [id number]     Sample type ARTERIAL     Allens test (pass/fail) NOT INDICATED (*) PASS  MAGNESIUM     Status: None   Collection Time    04/10/14  6:22 AM      Result Value Ref Range   Magnesium 1.7  1.5 - 2.5 mg/dL    ABGS  Recent Labs  04/10/14 0502  PHART 7.418  PO2ART 121.0*  TCO2 15.2  HCO3 17.2*   CULTURES Recent Results (from the past 240 hour(s))  MRSA PCR  SCREENING     Status: None   Collection Time    04/06/14  2:13 PM      Result Value Ref Range Status   MRSA by PCR NEGATIVE  NEGATIVE Final   Comment:  The GeneXpert MRSA Assay (FDA     approved for NASAL specimens     only), is one component of a     comprehensive MRSA colonization     surveillance program. It is not     intended to diagnose MRSA     infection nor to guide or     monitor treatment for     MRSA infections.   Studies/Results: Dg Chest Port 1 View  04/09/2014   CLINICAL DATA:  Cardiac arrest  EXAM: PORTABLE CHEST - 1 VIEW  COMPARISON:  Chest radiograph 04/06/2014  FINDINGS: Left subclavian central venous catheter tip projects at the expected location of the central left brachiocephalic vein. Enteric tube courses inferior to the diaphragm. The endotracheal tube appears to terminate within the distal trachea. Stable cardiac and mediastinal contours. Interval development of heterogeneous opacities right lung base. Probable skin fold projecting over the right lateral hemi thorax. Suggestion of deep sulcus on the left. No large consolidative opacities within left hemi thorax.  IMPRESSION: 1. Suggestion of deep sulcus on the left which is favored to be secondary to patient positioning. Additionally lucency around the aortic arch may be artifactual. Recommend attention on repeat evaluation to exclude the possibility of pneumothorax. 2. Endotracheal tube tip terminates 1.8 cm superior to the carina. 3. New heterogeneous opacities right lung base may represent combination of pleural fluid and/or atelectasis. 4. Lucency within the peripheral right lower hemi thorax is favored to represent skin fold. These results will be called to the ordering clinician or representative by the Radiologist Assistant, and communication documented in the PACS or zVision Dashboard.   Electronically Signed   By: Lovey Newcomer M.D.   On: 04/09/2014 09:56   Dg Chest Port 1v Same Day  04/09/2014   CLINICAL  DATA:  Right rib fractures.  Possible pneumothorax.  EXAM: PORTABLE CHEST - 1 VIEW SAME DAY  COMPARISON:  04/09/2014.  FINDINGS: Support devices are in stable position. Right basilar airspace opacity and probable layering effusion, stable. Left basilar atelectasis or infiltrate also noted. No visible pneumothorax.  IMPRESSION: Bibasilar opacities, right greater than left. Small layering right effusion. No visible pneumothorax.   Electronically Signed   By: Rolm Baptise M.D.   On: 04/09/2014 10:39    Medications:  Prior to Admission:  Prescriptions prior to admission  Medication Sig Dispense Refill  . atenolol (TENORMIN) 25 MG tablet Take 25 mg by mouth daily.       . AVODART 0.5 MG capsule Take 0.5 mg by mouth daily.       . citalopram (CELEXA) 10 MG tablet Take 20 mg by mouth daily.       Marland Kitchen DIOVAN 80 MG tablet Take 80 mg by mouth daily.       Marland Kitchen ezetimibe (ZETIA) 10 MG tablet Take 10 mg by mouth daily.        . furosemide (LASIX) 20 MG tablet Take 1 tablet (20 mg total) by mouth daily.  30 tablet  6  . nitroGLYCERIN (NITROSTAT) 0.4 MG SL tablet Place 1 tablet (0.4 mg total) under the tongue every 5 (five) minutes as needed for chest pain.  25 tablet  6  . oxyCODONE (ROXICODONE) 15 MG immediate release tablet Take 15 mg by mouth every 4 (four) hours as needed for pain.      . potassium chloride (K-DUR) 10 MEQ tablet Take 10 mEq by mouth 2 (two) times daily.      . risperiDONE (RISPERDAL) 0.5 MG tablet Take  0.5 mg by mouth 2 (two) times daily.      . simvastatin (ZOCOR) 20 MG tablet Take 20 mg by mouth at bedtime.       . tamsulosin (FLOMAX) 0.4 MG CAPS capsule Take 0.4 mg by mouth daily after supper.       . Thiamine HCl (VITAMIN B-1) 250 MG tablet Take 250 mg by mouth daily.        Marland Kitchen zolpidem (AMBIEN) 10 MG tablet Take 10 mg by mouth at bedtime as needed for sleep.       Scheduled: . antiseptic oral rinse  7 mL Mouth Rinse BID  . antiseptic oral rinse  7 mL Mouth Rinse q12n4p  .  chlorhexidine  15 mL Mouth Rinse BID  . furosemide  60 mg Intravenous BID  . metoprolol  2.5 mg Intravenous 4 times per day  . pantoprazole (PROTONIX) IV  40 mg Intravenous Daily  . piperacillin-tazobactam (ZOSYN)  IV  3.375 g Intravenous Q8H  . potassium chloride  10 mEq Intravenous Q1 Hr x 4  . vancomycin  500 mg Intravenous Q24H   Continuous: . amiodarone 30 mg/hr (04/10/14 0605)  . fentaNYL infusion INTRAVENOUS 25 mcg/hr (04/10/14 0720)  .  sodium bicarbonate infusion 1/4 NS 1000 mL 150 mL/hr at 04/10/14 0600   TYO:MAYOKHTX, fentaNYL, LORazepam, morphine injection, ondansetron (ZOFRAN) IV  Assesment: He was admitted with perforated peptic ulcer and upper GI bleed from that. He has multiple other medical problems and suffered cardiac arrest with ventricular tachycardia. He is intubated and on the ventilator. He is on amiodarone drip fentanyl drip he is on appropriate antibiotics. Principal Problem:   Cardiac arrest Active Problems:   Coronary atherosclerosis of native coronary artery   Rheumatoid arthritis, adult   Upper GI bleed   Perforated peptic ulcer    Plan: I think it's unlikely he's ready to come off of the ventilator. I will be out of town for 2 days.    LOS: 4 days   Matthews Franks L 04/10/2014, 8:21 AM

## 2014-04-10 NOTE — Progress Notes (Addendum)
Subjective: Interval History: Patient intubated but arousable  Objective: Vital signs in last 24 hours: Temp:  [97.2 F (36.2 C)-97.6 F (36.4 C)] 97.2 F (36.2 C) (09/05 0008) Pulse Rate:  [25-160] 69 (09/05 0815) Resp:  [16-36] 19 (09/05 0815) BP: (78-187)/(43-116) 136/67 mmHg (09/05 0815) SpO2:  [52 %-100 %] 100 % (09/05 0815) FiO2 (%):  [40 %-100 %] 40 % (09/05 0803) Weight change:   Intake/Output from previous day: 09/04 0701 - 09/05 0700 In: 4127.4 [I.V.:3477.4; IV Piggyback:650] Out: 4365 [Urine:4300; Emesis/NG output:25; Drains:40] Intake/Output this shift: Total I/O In: 370.7 [I.V.:170.7; IV Piggyback:200] Out: 60 [Drains:60]  Patient somulent  And intubated Chest inspiratory rhonchi bilaterally and anteriorly Abdomen hypoactive No edema  Lab Results:  Recent Labs  04/09/14 0427 04/09/14 0947  WBC 9.0 13.4*  HGB 11.6* 12.6*  HCT 35.1* 38.6*  PLT 160 178   BMET:   Recent Labs  04/09/14 0947 04/10/14 0436  NA 150* 145  K 3.3* 2.7*  CL 118* 109  CO2 11* 19  GLUCOSE 85 136*  BUN 30* 28*  CREATININE 1.80* 1.63*  CALCIUM 8.7 8.3*   No results found for this basename: PTH,  in the last 72 hours Iron Studies: No results found for this basename: IRON, TIBC, TRANSFERRIN, FERRITIN,  in the last 72 hours  Studies/Results: Dg Chest Port 1 View  04/09/2014   CLINICAL DATA:  Cardiac arrest  EXAM: PORTABLE CHEST - 1 VIEW  COMPARISON:  Chest radiograph 04/06/2014  FINDINGS: Left subclavian central venous catheter tip projects at the expected location of the central left brachiocephalic vein. Enteric tube courses inferior to the diaphragm. The endotracheal tube appears to terminate within the distal trachea. Stable cardiac and mediastinal contours. Interval development of heterogeneous opacities right lung base. Probable skin fold projecting over the right lateral hemi thorax. Suggestion of deep sulcus on the left. No large consolidative opacities within left hemi  thorax.  IMPRESSION: 1. Suggestion of deep sulcus on the left which is favored to be secondary to patient positioning. Additionally lucency around the aortic arch may be artifactual. Recommend attention on repeat evaluation to exclude the possibility of pneumothorax. 2. Endotracheal tube tip terminates 1.8 cm superior to the carina. 3. New heterogeneous opacities right lung base may represent combination of pleural fluid and/or atelectasis. 4. Lucency within the peripheral right lower hemi thorax is favored to represent skin fold. These results will be called to the ordering clinician or representative by the Radiologist Assistant, and communication documented in the PACS or zVision Dashboard.   Electronically Signed   By: Annia Belt M.D.   On: 04/09/2014 09:56   Dg Chest Port 1v Same Day  04/09/2014   CLINICAL DATA:  Right rib fractures.  Possible pneumothorax.  EXAM: PORTABLE CHEST - 1 VIEW SAME DAY  COMPARISON:  04/09/2014.  FINDINGS: Support devices are in stable position. Right basilar airspace opacity and probable layering effusion, stable. Left basilar atelectasis or infiltrate also noted. No visible pneumothorax.  IMPRESSION: Bibasilar opacities, right greater than left. Small layering right effusion. No visible pneumothorax.   Electronically Signed   By: Charlett Nose M.D.   On: 04/09/2014 10:39    I have reviewed the patient's current medications.  Assessment/Plan: Problem #1 acute kidney injury: His BUN is 28 and his  creatinine is 1.63 his renal function continue to improve in spite of additional insult from his cardiac arrest yesterday Problem #2 status post surgery for perforated ulcer Problem #3  metabolic acidosis: his CO2 is  28 and PH 7.41 with PCO2 of 27.2 . Patient metabolic acidosis has corected Problem #4 metabolic bone disease: His calcium and phosphorus is range Problem #5 history of chronic kidney disease stage III. Problem #6 anemia: His hemoglobin slightly low but  stable. Problem #7 hypertension: His blood pressure is reasonably controlled. Problem#8 Hypernatremia : Sodium has corrected. Problem #9Hypokalemia: His potassium is low posibley from lasix Plan: We'll d/c ivf with sodium bicarbonate I agree with kcl 10 meq iv run x 3 Start on 1/2 ns with 40 meq kcl at 100 cc/hr Decrease lasix to 20 mg iv bid We'll check his basic metabolic panel in the morning.   LOS: 4 days   [name] S 04/10/2014,8:55 AM

## 2014-04-10 NOTE — Progress Notes (Addendum)
Note: This document was prepared with digital dictation and possible smart phrase technology. Any transcriptional errors that result from this process are unintentional.   [name] MLY:[id number] DOB: 09-25-1932 DOA: 04/06/2014 PCP: Octavio Graves, DO  Brief narrative: 80 y/o ?, known prior history MI 1987, 1997-status post CABG with residual CAD, CKD since 2009, hypertension, hyperlipidemia, osteoarthritis, rheumatoid arthritis--prior complicated admission 03/17/74 for Mallory-Weiss tear, hemorrhagic shock from anemia blood loss, non-ST MI/Vtach 4 hypokalemia and acute respiratory failure requiring intubation admitted from home on 9/1/515 with generalized abdominal pain found to be hypotensive-CT scan abdomen and pelvis revealed concern for peritonitis and free air in the upper abdomen, left hydronephrosis due to chronic UPJ obstruction. Patient had emergent exploratory laparoscopy 04/06/14, placed on broad-spectrum antibiotic for suspected peritonitis, subsequently had V. tach code blue 04/09/14, intubated for airway protection [facility] assumed care from Dr. Arnoldo Morale medical issues Cardiology, pulmonary were consulted Family refused transport as patient initially was a no code blue however they were okay with perioperative management   Past medical history-As per Problem list Chart reviewed as below- Reviewed  Consultants:  Pulmonology  Cardiology  Nephrology  Procedures:  Intubation day 04/10/14  Triple lumen 04/06/14  NG tube 16 French 04/06/14  Foley catheter 04/06/49  Antibiotics:  Zosyn 04/06/09  Vancomycin 04/09/14  Vent Mode:  [-] PRVC FiO2 (%):  [40 %-100 %] 40 % Set Rate:  [18 bmp] 18 bmp Vt Set:  [500 mL] 500 mL PEEP:  [5 [place] Pressure:  [12 cmH20-14 cmH20] 14 cmH20    Subjective  More alert, agitated pulling at tubes Nursing reports a good night, Patient able to follow commands appropriately Motions that he does not have any pain   Objective     Interim History: None  Telemetry: Sinus, PVCs   Objective: Filed Vitals:   04/10/14 0645 04/10/14 0700 04/10/14 0715 04/10/14 0803  BP: 90/55 102/56 87/48   Pulse: 57 59 57   Temp:      TempSrc:      Resp: _0 Height:      Weight:      SpO2: 95% 99% 100% 93%    Intake/Output Summary (Last 24 hours) at 04/10/14 0817 Last data filed at 04/10/14 0622  Gross per 24 hour  Intake 3668.99 ml  Output   4145 ml  Net -476.01 ml    Exam:  General: Alert intubated GCS 11T. Cardiovascular: S1-S2 slightly tach cardiac regular Respiratory: Clear anteriorly Abdomen: Soft nontender NG tube as well as area around ostomy seems iontact Skin no lower extremity edema Neuro grossly intact. Extraocular movements intact power 5/5 and moving limbs on command.  Data Reviewed: Basic Metabolic Panel:  Recent Labs Lab 04/07/14 0506 04/08/14 0409 04/09/14 0427 04/09/14 0947 04/10/14 0436 04/10/14 0622  NA 142 146 149* 150* 145  --   K 5.2 4.1 3.6* 3.3* 2.7*  --   CL 115* 117* 118* 118* 109  --   CO2 13* 15* 13* 11* 19  --   GLUCOSE 82 77 68* 85 136*  --   BUN 33* 37* 31* 30* 28*  --   CREATININE 3.31* 2.66* 1.89* 1.80* 1.63*  --   CALCIUM 7.7* 8.3* 8.6 8.7 8.3*  --   MG 1.7 1.6 1.7  --   --  1.7  PHOS 4.7* 4.2 3.3  --   --   --    Liver Function Tests:  Recent Labs Lab 04/06/14 0653 04/09/14 1700  AST 21 41*  ALT 13 26  ALKPHOS 61 48  BILITOT 0.6 0.6  PROT 6.4 5.2*  ALBUMIN 3.8 2.2*    Recent Labs Lab 04/06/14 0653  LIPASE 81*   No results found for this basename: AMMONIA,  in the last 168 hours CBC:  Recent Labs Lab 04/06/14 0653 04/07/14 0506 04/07/14 2104 04/08/14 0409 04/09/14 0427 04/09/14 0947  WBC 15.7* 7.2 8.1 7.5 9.0 13.4*  NEUTROABS 13.2*  --   --   --   --  10.4*  HGB 14.1 13.3 11.9* 11.3* 11.6* 12.6*  HCT 42.1 39.7 35.6* 34.5* 35.1* 38.6*  MCV 85.2 86.3 85.4 85.6 86.0 87.3  PLT 189 170 147* 144* 160 178   Cardiac  Enzymes:  Recent Labs Lab 04/09/14 0947 04/09/14 1220 04/09/14 1603 04/09/14 2212 04/10/14 0436  TROPONINI 2.81* 4.48* 8.00* 11.45* 9.25*   BNP: No components found with this basename: POCBNP,  CBG: No results found for this basename: GLUCAP,  in the last 168 hours  Recent Results (from the past 240 hour(s))  MRSA PCR SCREENING     Status: None   Collection Time    04/06/14  2:13 PM      Result Value Ref Range Status   MRSA by PCR NEGATIVE  NEGATIVE Final   Comment:            The GeneXpert MRSA Assay (FDA     approved for NASAL specimens     only), is one component of a     comprehensive MRSA colonization     surveillance program. It is not     intended to diagnose MRSA     infection nor to guide or     monitor treatment for     MRSA infections.     Studies:              All Imaging reviewed and is as per above notation   Scheduled Meds: . antiseptic oral rinse  7 mL Mouth Rinse BID  . antiseptic oral rinse  7 mL Mouth Rinse q12n4p  . chlorhexidine  15 mL Mouth Rinse BID  . furosemide  60 mg Intravenous BID  . metoprolol  2.5 mg Intravenous 4 times per day  . pantoprazole (PROTONIX) IV  40 mg Intravenous Daily  . piperacillin-tazobactam (ZOSYN)  IV  3.375 g Intravenous Q8H  . potassium chloride  10 mEq Intravenous Q1 Hr x 4  . vancomycin  500 mg Intravenous Q24H   Continuous Infusions: . amiodarone 30 mg/hr (04/10/14 0605)  . fentaNYL infusion INTRAVENOUS 25 mcg/hr (04/10/14 0720)  .  sodium bicarbonate infusion 1/4 NS 1000 mL 150 mL/hr at 04/10/14 0600     Assessment/Plan:  1. Vtach Cardiac arrest-2/2 to sepsis-ROSC after 3mn with 2 epinephrine, Cardioversion.  Still some cardiac irritability with Episodic Vtach but currently on Amiodarone 30 mcg/hr.  We will keep him on this.  Troponin's are elevated likely 2/2 to recent Vtach arrest.   Echo 04/10/14 shows global hypokinesis.  His kidney function is improving-defer to Cardiology regarding need for Cardiac  catheterization.  Daily Magensium 2. Acute respiratory failure 2/2 to #1-on Vent-Has been weaned to 40% Fio2 but not ready to wean off ventilator yet per my discussion with Pulmonology.  We will re-check a blood gas in the am and review 3. Non-oliguric AKI [inital Creat on admit 3.29]c Met Acidosis with with AG=17 [2/2 to Peritonitis /sepsis and renal failure] and compensated resp alkalosis and hypernatremia-etiology sepsis as well as  free water deficit-Currently on Biarb Gtt.  Blood gas from this am shows improvement of acidosis as well as hypernatremia.  His creatinine is improved-I have decreased Bicarb to 75 cc/hr , as he is +10 liters for this admission.  Follow am ABG 4. Hypokalemia-we will replace IV with 4 runs of K IV.  This may be related to him getting lasix which i cut back to 40 IV bid on 04/10/14 5. S/p Surgery Perforated Viscus 04/06/14-Per gen surgery.  Consider TPN in next 1-2 days if still on Vent and NPO. 6. H/o CAD-MI '87, '97-currently stable.  Rectal asa added 04/10/14 7. HLD-statin on hold 8. Rheumatoid Arthritis on hold   Code Status: Was DNR, but is currently FULL code status on Ventilator-will discuss further with family POC today Family Communication: spoke with sister at bedside Disposition Plan: ICU-Consider transfer over next 24 hours to [facility] as is on Ventilator-lack of CCM and Cardiology support here over long weekend.  Appreciate Nephrology input   Verneita Griffes, MD  [facility] Pager [phone] 04/10/2014, 8:17 AM    LOS: 4 days

## 2014-04-10 NOTE — Progress Notes (Signed)
4 Days Post-Op  Subjective: Intubated and sedated.  Objective: Vital signs in last 24 hours: Temp:  [97.2 F (36.2 C)-97.7 F (36.5 C)] 97.7 F (36.5 C) (09/05 0800) Pulse Rate:  [25-134] 95 (09/05 1115) Resp:  [16-30] 19 (09/05 1115) BP: (78-153)/(43-101) 152/101 mmHg (09/05 1115) SpO2:  [68 %-100 %] 100 % (09/05 1115) FiO2 (%):  [40 %-80 %] 40 % (09/05 1105) Last BM Date:  (unknown )  Intake/Output from previous day: 09/04 0701 - 09/05 0700 In: 4127.4 [I.V.:3477.4; IV Piggyback:650] Out: 4365 [Urine:4300; Emesis/NG output:25; Drains:40] Intake/Output this shift: Total I/O In: 904.9 [I.V.:554.9; IV Piggyback:350] Out: 60 [Drains:60]  General appearance: Intubated GI: soft, non-tender; bowel sounds normal; no masses,  no organomegaly and Minimal serosanguineous drainage from JP drain. Incision healing well.  Lab Results:   Recent Labs  04/09/14 0427 04/09/14 0947  WBC 9.0 13.4*  HGB 11.6* 12.6*  HCT 35.1* 38.6*  PLT 160 178   BMET  Recent Labs  04/09/14 0947 04/10/14 0436  NA 150* 145  K 3.3* 2.7*  CL 118* 109  CO2 11* 19  GLUCOSE 85 136*  BUN 30* 28*  CREATININE 1.80* 1.63*  CALCIUM 8.7 8.3*   PT/INR  Recent Labs  04/09/14 0947  LABPROT 15.6*  INR 1.24    Studies/Results: Dg Chest Port 1 View  04/09/2014   CLINICAL DATA:  Cardiac arrest  EXAM: PORTABLE CHEST - 1 VIEW  COMPARISON:  Chest radiograph 04/06/2014  FINDINGS: Left subclavian central venous catheter tip projects at the expected location of the central left brachiocephalic vein. Enteric tube courses inferior to the diaphragm. The endotracheal tube appears to terminate within the distal trachea. Stable cardiac and mediastinal contours. Interval development of heterogeneous opacities right lung base. Probable skin fold projecting over the right lateral hemi thorax. Suggestion of deep sulcus on the left. No large consolidative opacities within left hemi thorax.  IMPRESSION: 1. Suggestion of deep  sulcus on the left which is favored to be secondary to patient positioning. Additionally lucency around the aortic arch may be artifactual. Recommend attention on repeat evaluation to exclude the possibility of pneumothorax. 2. Endotracheal tube tip terminates 1.8 cm superior to the carina. 3. New heterogeneous opacities right lung base may represent combination of pleural fluid and/or atelectasis. 4. Lucency within the peripheral right lower hemi thorax is favored to represent skin fold. These results will be called to the ordering clinician or representative by the Radiologist Assistant, and communication documented in the PACS or zVision Dashboard.   Electronically Signed   By: Annia Belt M.D.   On: 04/09/2014 09:56   Dg Chest Port 1v Same Day  04/09/2014   CLINICAL DATA:  Right rib fractures.  Possible pneumothorax.  EXAM: PORTABLE CHEST - 1 VIEW SAME DAY  COMPARISON:  04/09/2014.  FINDINGS: Support devices are in stable position. Right basilar airspace opacity and probable layering effusion, stable. Left basilar atelectasis or infiltrate also noted. No visible pneumothorax.  IMPRESSION: Bibasilar opacities, right greater than left. Small layering right effusion. No visible pneumothorax.   Electronically Signed   By: Charlett Nose M.D.   On: 04/09/2014 10:39    Anti-infectives: Anti-infectives   Start     Dose/Rate Route Frequency Ordered Stop   04/10/14 1230  vancomycin (VANCOCIN) 500 mg in sodium chloride 0.9 % 100 mL IVPB     500 mg 100 mL/hr over 60 Minutes Intravenous Every 24 hours 04/09/14 1217     04/09/14 1230  vancomycin (VANCOCIN) IVPB 750  mg/150 ml premix     750 mg 150 mL/hr over 60 Minutes Intravenous  Once 04/09/14 1217 04/09/14 1408   04/09/14 1000  piperacillin-tazobactam (ZOSYN) IVPB 3.375 g     3.375 g 12.5 mL/hr over 240 Minutes Intravenous Every 8 hours 04/09/14 0831     04/06/14 1800  piperacillin-tazobactam (ZOSYN) IVPB 2.25 g  Status:  Discontinued     2.25 g 100 mL/hr  over 30 Minutes Intravenous Every 8 hours 04/06/14 1458 04/09/14 0831   04/06/14 1600  piperacillin-tazobactam (ZOSYN) IVPB 3.375 g  Status:  Discontinued     3.375 g 12.5 mL/hr over 240 Minutes Intravenous Every 8 hours 04/06/14 1438 04/06/14 1453   04/06/14 0930  piperacillin-tazobactam (ZOSYN) IVPB 3.375 g     3.375 g 100 mL/hr over 30 Minutes Intravenous  Once 04/06/14 0923 04/06/14 1036      Assessment/Plan: s/p Procedure(s): EXPLORATORY LAPAROTOMY, GRAHAM PLICATION INSERTION CENTRAL LINE ADULT Impression: Status post MI, cardiac arrest. Renal function improving. If tube feeds to be started, would suggest an upper GI series via the NG tube to make sure the plication is successful and no continuing duodenal ulcer leak is noted.  LOS: 4 days    [name] A 04/10/2014

## 2014-04-11 ENCOUNTER — Inpatient Hospital Stay (HOSPITAL_COMMUNITY): Payer: Medicare Other

## 2014-04-11 LAB — CBC WITH DIFFERENTIAL/PLATELET
BASOS ABS: 0 10*3/uL (ref 0.0–0.1)
Basophils Relative: 0 % (ref 0–1)
Eosinophils Absolute: 0.1 10*3/uL (ref 0.0–0.7)
Eosinophils Relative: 1 % (ref 0–5)
HCT: 32.6 % — ABNORMAL LOW (ref 39.0–52.0)
Hemoglobin: 11.2 g/dL — ABNORMAL LOW (ref 13.0–17.0)
LYMPHS ABS: 0.7 10*3/uL (ref 0.7–4.0)
LYMPHS PCT: 8 % — AB (ref 12–46)
MCH: 28.2 pg (ref 26.0–34.0)
MCHC: 34.4 g/dL (ref 30.0–36.0)
MCV: 82.1 fL (ref 78.0–100.0)
Monocytes Absolute: 0.6 10*3/uL (ref 0.1–1.0)
Monocytes Relative: 7 % (ref 3–12)
NEUTROS ABS: 6.9 10*3/uL (ref 1.7–7.7)
Neutrophils Relative %: 84 % — ABNORMAL HIGH (ref 43–77)
PLATELETS: 157 10*3/uL (ref 150–400)
RBC: 3.97 MIL/uL — ABNORMAL LOW (ref 4.22–5.81)
RDW: 16.6 % — AB (ref 11.5–15.5)
WBC: 8.3 10*3/uL (ref 4.0–10.5)

## 2014-04-11 LAB — BLOOD GAS, ARTERIAL
ACID-BASE DEFICIT: 3.1 mmol/L — AB (ref 0.0–2.0)
Acid-base deficit: 2.6 mmol/L — ABNORMAL HIGH (ref 0.0–2.0)
BICARBONATE: 20.1 meq/L (ref 20.0–24.0)
Bicarbonate: 20.2 mEq/L (ref 20.0–24.0)
DRAWN BY: 23534
Drawn by: 105551
FIO2: 0.4 %
FIO2: 0.4 %
MODE: POSITIVE
O2 Content: 40 L/min
O2 Saturation: 98.3 %
O2 Saturation: 97.8 %
PATIENT TEMPERATURE: 37
PEEP/CPAP: 5 cmH2O
PEEP: 5 cmH2O
PH ART: 7.455 — AB (ref 7.350–7.450)
Patient temperature: 37
Pressure support: 10 cmH2O
RATE: 18 resp/min
TCO2: 17.4 mmol/L (ref 0–100)
TCO2: 18.1 mmol/L (ref 0–100)
VT: 500 mL
pCO2 arterial: 25.3 mmHg — ABNORMAL LOW (ref 35.0–45.0)
pCO2 arterial: 29.2 mmHg — ABNORMAL LOW (ref 35.0–45.0)
pH, Arterial: 7.511 — ABNORMAL HIGH (ref 7.350–7.450)
pO2, Arterial: 115 mmHg — ABNORMAL HIGH (ref 80.0–100.0)
pO2, Arterial: 108 mmHg — ABNORMAL HIGH (ref 80.0–100.0)

## 2014-04-11 LAB — COMPREHENSIVE METABOLIC PANEL
ALT: 13 U/L (ref 0–53)
ANION GAP: 13 (ref 5–15)
AST: 18 U/L (ref 0–37)
Albumin: 1.9 g/dL — ABNORMAL LOW (ref 3.5–5.2)
Alkaline Phosphatase: 46 U/L (ref 39–117)
BUN: 23 mg/dL (ref 6–23)
CO2: 22 meq/L (ref 19–32)
CREATININE: 1.39 mg/dL — AB (ref 0.50–1.35)
Calcium: 7.7 mg/dL — ABNORMAL LOW (ref 8.4–10.5)
Chloride: 108 mEq/L (ref 96–112)
GFR calc Af Amer: 54 mL/min — ABNORMAL LOW (ref >90)
GFR, EST NON AFRICAN AMERICAN: 47 mL/min — AB (ref >90)
Glucose, Bld: 106 mg/dL — ABNORMAL HIGH (ref 70–99)
Potassium: 3.2 mEq/L — ABNORMAL LOW (ref 3.7–5.3)
Sodium: 143 mEq/L (ref 137–147)
Total Bilirubin: 0.4 mg/dL (ref 0.3–1.2)
Total Protein: 4.5 g/dL — ABNORMAL LOW (ref 6.0–8.3)

## 2014-04-11 LAB — MAGNESIUM: MAGNESIUM: 1.3 mg/dL — AB (ref 1.5–2.5)

## 2014-04-11 LAB — PHOSPHORUS: Phosphorus: 1.7 mg/dL — ABNORMAL LOW (ref 2.3–4.6)

## 2014-04-11 LAB — POTASSIUM: Potassium: 3.2 mEq/L — ABNORMAL LOW (ref 3.7–5.3)

## 2014-04-11 MED ORDER — POTASSIUM CHLORIDE 20 MEQ/15ML (10%) PO LIQD
20.0000 meq | Freq: Two times a day (BID) | ORAL | Status: AC
  Administered 2014-04-12 (×2): 20 meq via ORAL
  Filled 2014-04-11 (×2): qty 30

## 2014-04-11 MED ORDER — VANCOMYCIN HCL IN DEXTROSE 750-5 MG/150ML-% IV SOLN
750.0000 mg | INTRAVENOUS | Status: DC
  Administered 2014-04-11: 750 mg via INTRAVENOUS
  Filled 2014-04-11 (×2): qty 150

## 2014-04-11 MED ORDER — CETYLPYRIDINIUM CHLORIDE 0.05 % MT LIQD
7.0000 mL | Freq: Two times a day (BID) | OROMUCOSAL | Status: DC
  Administered 2014-04-11 – 2014-04-13 (×4): 7 mL via OROMUCOSAL

## 2014-04-11 NOTE — Progress Notes (Signed)
Pt found with central line pulled out. Applied pressure to site with Vaseline impregnated gauze. Pt denies chest pain, SOB, or any discomfort. VS are stable and O2 sats are in the upper 90s-100% on 4L [place]. Placed pt in trendelenburg position on his left side.   MD was notified and asked that new access be obtained. After several attempts access was obtained in pt's right foot.   Amiodarone drip was reinstated and pharmacy was consulted to ensure compatability of all IV medications. Zosyn is not compatible with amiodarone. Surgeon who ordered zosyn was notified. He ordered to d/c zosyn, but to keep pt on IV vancomycin (which is compatible with amiodarone). Potassium chloride and 1/2 NS are not clearly compatible with amiodarone per pharmacy. Midlevel on call was notified and he will write an order to check pt's serum potassium, if normal will keep pt off of maintanence fluids as he is able to take sips of clear liquids over night until central line access can be obtained. If serum potassium is low replacement potassium will be ordered.   Will continue to monitor.

## 2014-04-11 NOTE — Progress Notes (Signed)
Subjective: Interval History: Patient arousable but remained intubated  Objective: Vital signs in last 24 hours: Temp:  [97 F (36.1 C)-98.3 F (36.8 C)] 97.5 F (36.4 C) (09/06 0400) Pulse Rate:  [45-141] 81 (09/06 0715) Resp:  [14-26] 14 (09/06 0715) BP: (54-152)/(39-101) 111/56 mmHg (09/06 0715) SpO2:  [68 %-100 %] 100 % (09/06 0715) FiO2 (%):  [40 %] 40 % (09/06 0548) Weight:  [57 kg (125 lb 10.6 oz)] 57 kg (125 lb 10.6 oz) (09/06 0500) Weight change:   Intake/Output from previous day: 09/05 0701 - 09/06 0700 In: 3262.5 [I.V.:2862.5; IV Piggyback:400] Out: 2770 [Urine:2600; Drains:170] Intake/Output this shift:   Generally: Patient arousable ,intubated and none comunicative Chest clear Heart RRR no murmur Extremities no edema   Lab Results:  Recent Labs  04/09/14 0947 04/11/14 0511  WBC 13.4* 8.3  HGB 12.6* 11.2*  HCT 38.6* 32.6*  PLT 178 157   BMET:   Recent Labs  04/10/14 0436 04/11/14 0511  NA 145 143  K 2.7* 3.2*  CL 109 108  CO2 19 22  GLUCOSE 136* 106*  BUN 28* 23  CREATININE 1.63* 1.39*  CALCIUM 8.3* 7.7*   No results found for this basename: PTH,  in the last 72 hours Iron Studies: No results found for this basename: IRON, TIBC, TRANSFERRIN, FERRITIN,  in the last 72 hours  Studies/Results: Dg Chest Port 1 View  04/11/2014   CLINICAL DATA:  Right rib fractures. Cardiac arrest on Friday. Recent abdominal surgery. Perforation.  EXAM: PORTABLE CHEST - 1 VIEW  COMPARISON:  04/09/2014  FINDINGS: Appliances appear in unchanged in position. Normal heart size. Pulmonary vascularity appears prominent possibly due date AP supine technique. Infiltration or atelectasis in the lung bases. Costophrenic angles are not included within the field of view.  IMPRESSION: Infiltration or atelectasis in the lung bases similar to previous study.   Electronically Signed   By: Burman Nieves M.D.   On: 04/11/2014 06:54   Dg Chest Port 1 View  04/09/2014   CLINICAL  DATA:  Cardiac arrest  EXAM: PORTABLE CHEST - 1 VIEW  COMPARISON:  Chest radiograph 04/06/2014  FINDINGS: Left subclavian central venous catheter tip projects at the expected location of the central left brachiocephalic vein. Enteric tube courses inferior to the diaphragm. The endotracheal tube appears to terminate within the distal trachea. Stable cardiac and mediastinal contours. Interval development of heterogeneous opacities right lung base. Probable skin fold projecting over the right lateral hemi thorax. Suggestion of deep sulcus on the left. No large consolidative opacities within left hemi thorax.  IMPRESSION: 1. Suggestion of deep sulcus on the left which is favored to be secondary to patient positioning. Additionally lucency around the aortic arch may be artifactual. Recommend attention on repeat evaluation to exclude the possibility of pneumothorax. 2. Endotracheal tube tip terminates 1.8 cm superior to the carina. 3. New heterogeneous opacities right lung base may represent combination of pleural fluid and/or atelectasis. 4. Lucency within the peripheral right lower hemi thorax is favored to represent skin fold. These results will be called to the ordering clinician or representative by the Radiologist Assistant, and communication documented in the PACS or zVision Dashboard.   Electronically Signed   By: Annia Belt M.D.   On: 04/09/2014 09:56   Dg Chest Port 1v Same Day  04/09/2014   CLINICAL DATA:  Right rib fractures.  Possible pneumothorax.  EXAM: PORTABLE CHEST - 1 VIEW SAME DAY  COMPARISON:  04/09/2014.  FINDINGS: Support devices are in stable  position. Right basilar airspace opacity and probable layering effusion, stable. Left basilar atelectasis or infiltrate also noted. No visible pneumothorax.  IMPRESSION: Bibasilar opacities, right greater than left. Small layering right effusion. No visible pneumothorax.   Electronically Signed   By: Charlett Nose M.D.   On: 04/09/2014 10:39    I have  reviewed the patient'[name] current medications.  Assessment/Plan: Problem #1 acute kidney injury: His renal function is progessively improving and is returning to his base line Problem #2 status post surgery for perforated ulcer Problem #3  Acid base balance: Presently patient is developing Respiratory Alkalosis. The rate has been dicreased Problem #4 metabolic bone disease: His calcium and phosphorus is range Problem #5 history of chronic kidney disease stage III. Problem #6 anemia: His hemoglobin is good and better Problem #7 hypotension: Problem#8 Hypernatremia : Sodium has corrected. Problem #9Hypokalemia: His is on potassium supplement and presently low but improving Problem#10 [name]/P V.tack/MI Plan: We'll d/c ivf lasix Decrease IVF to 75 cc/hr We'll check his basic metabolic panel in the morning.   LOS: 5 days   [name] 04/11/2014,8:02 AM

## 2014-04-11 NOTE — Progress Notes (Deleted)
Note: This document was prepared with digital dictation and possible smart phrase technology. Any transcriptional errors that result from this process are unintentional.   CLAUS SILVESTRO DGU:[id number] DOB: 26-Jun-1933 DOA: 04/06/2014 PCP: Octavio Graves, DO  Brief narrative: 80 y/o ?, known prior history MI 1987, 1997-status post CABG with residual CAD, CKD since 2009, hypertension, hyperlipidemia, osteoarthritis, rheumatoid arthritis--prior complicated admission 9/56/38 for Mallory-Weiss tear, hemorrhagic shock from anemia blood loss, non-ST MI/Vtach 4 hypokalemia and acute respiratory failure requiring intubation admitted from home on 9/1/515 with generalized abdominal pain found to be hypotensive-CT scan abdomen and pelvis revealed concern for peritonitis and free air in the upper abdomen, left hydronephrosis due to chronic UPJ obstruction. Patient had emergent exploratory laparoscopy 04/06/14, placed on broad-spectrum antibiotic for suspected peritonitis, subsequently had V. tach code blue 04/09/14, intubated for airway protection [facility] assumed care from Dr. Arnoldo Morale medical issues Cardiology, pulmonary were consulted Family refused transport as patient initially was a no code blue however they were okay with perioperative management, but if he were to be extubated and condition deteriorated, do not want him re-intubated or aggressively resuscitated.  Past medical history-As per Problem list Chart reviewed as below- Reviewed  Consultants:  Pulmonology  Cardiology  Nephrology  Procedures:  Intubation day 04/10/14  Triple lumen 04/06/14  NG tube 16 French 04/06/14  Foley catheter 04/06/49  Antibiotics:  Zosyn 04/06/09  Vancomycin 04/09/14  Vent Mode:  [-] PRVC FiO2 (%):  [40 %] 40 % Set Rate:  [14 bmp-18 bmp] 14 bmp Vt Set:  [50 mL-500 mL] 500 mL PEEP:  [5 [place] Pressure:  [10 cmH20-14 cmH20] 11 cmH20    Subjective  More alert, pulling at ETT [facility] to verbalize a  little says  He feels "fine" denies pain or discomfort anywhere Denies other c/o but looks uncomfortable    Objective    Interim History: None  Telemetry: Sinus, PVCs   Objective: Filed Vitals:   04/11/14 0800 04/11/14 0807 04/11/14 0815 04/11/14 0830  BP: 118/59  125/56 108/59  Pulse: 59 84 65 60  Temp: 98.9 F (37.2 C)     TempSrc: Axillary     Resp: $Remo'14 14 16 14  'rPyOX$ Height:      Weight:      SpO2: 100% 100% 100% 100%    Intake/Output Summary (Last 24 hours) at 04/11/14 0908 Last data filed at 04/11/14 0825  Gross per 24 hour  Intake 2962.32 ml  Output   2750 ml  Net 212.32 ml    Exam:  General: Alert intubated GCS 11T. Cardiovascular: S1-S2 slightly tach cardiac regular Respiratory: Clear anteriorly Abdomen: Soft nontender NG tube as well as area around ostomy seems intact.  Abdomen soft and nontender-drainage~170 Skin no lower extremity edema Neuro grossly intact. Extraocular movements intact power 5/5 and moving limbs on command.  Data Reviewed: Basic Metabolic Panel:  Recent Labs Lab 04/07/14 0506 04/08/14 0409 04/09/14 0427 04/09/14 0947 04/10/14 0436 04/10/14 0622 04/11/14 0511  NA 142 146 149* 150* 145  --  143  K 5.2 4.1 3.6* 3.3* 2.7*  --  3.2*  CL 115* 117* 118* 118* 109  --  108  CO2 13* 15* 13* 11* 19  --  22  GLUCOSE 82 77 68* 85 136*  --  106*  BUN 33* 37* 31* 30* 28*  --  23  CREATININE 3.31* 2.66* 1.89* 1.80* 1.63*  --  1.39*  CALCIUM 7.7* 8.3* 8.6 8.7 8.3*  --  7.7*  MG  1.7 1.6 1.7  --   --  1.7 1.3*  PHOS 4.7* 4.2 3.3  --   --   --  1.7*   Liver Function Tests:  Recent Labs Lab 04/06/14 0653 04/09/14 0947 04/11/14 0511  AST 21 41* 18  ALT $Re'13 26 13  'CvG$ ALKPHOS 61 48 46  BILITOT 0.6 0.6 0.4  PROT 6.4 5.2* 4.5*  ALBUMIN 3.8 2.2* 1.9*    Recent Labs Lab 04/06/14 0653  LIPASE 81*   No results found for this basename: AMMONIA,  in the last 168 hours CBC:  Recent Labs Lab 04/06/14 0653  04/07/14 2104 04/08/14 0409  04/09/14 0427 04/09/14 0947 04/11/14 0511  WBC 15.7*  < > 8.1 7.5 9.0 13.4* 8.3  NEUTROABS 13.2*  --   --   --   --  10.4* 6.9  HGB 14.1  < > 11.9* 11.3* 11.6* 12.6* 11.2*  HCT 42.1  < > 35.6* 34.5* 35.1* 38.6* 32.6*  MCV 85.2  < > 85.4 85.6 86.0 87.3 82.1  PLT 189  < > 147* 144* 160 178 157  < > = values in this interval not displayed. Cardiac Enzymes:  Recent Labs Lab 04/09/14 0947 04/09/14 1220 04/09/14 1603 04/09/14 2212 04/10/14 0436  TROPONINI 2.81* 4.48* 8.00* 11.45* 9.25*   BNP: No components found with this basename: POCBNP,  CBG: No results found for this basename: GLUCAP,  in the last 168 hours  Recent Results (from the past 240 hour(s))  MRSA PCR SCREENING     Status: None   Collection Time    04/06/14  2:13 PM      Result Value Ref Range Status   MRSA by PCR NEGATIVE  NEGATIVE Final   Comment:            The GeneXpert MRSA Assay (FDA     approved for NASAL specimens     only), is one component of a     comprehensive MRSA colonization     surveillance program. It is not     intended to diagnose MRSA     infection nor to guide or     monitor treatment for     MRSA infections.     Studies:              All Imaging reviewed and is as per above notation   Scheduled Meds: . antiseptic oral rinse  7 mL Mouth Rinse BID  . antiseptic oral rinse  7 mL Mouth Rinse q12n4p  . chlorhexidine  15 mL Mouth Rinse BID  . pantoprazole (PROTONIX) IV  40 mg Intravenous Daily  . piperacillin-tazobactam (ZOSYN)  IV  3.375 g Intravenous Q8H  . vancomycin  500 mg Intravenous Q24H   Continuous Infusions: . amiodarone 30 mg/hr (04/11/14 0800)  . fentaNYL infusion INTRAVENOUS 50 mcg/hr (04/11/14 0800)  . sodium chloride 0.45 % with kcl 75 mL/hr at 04/11/14 4696     Assessment/Plan:  1. Vtach Cardiac arrest-2/2 to sepsis-ROSC after 20min with 2 epinephrine, Cardioversion.  Still some cardiac irritability with Episodic Vtach but currently on Amiodarone 30 mcg/hr.  We  will keep him on this.  Troponin's are elevated likely 2/2 to recent Vtach arrest.   Echo 04/10/14 shows global hypokinesis.  Daily Magensium.  Will ask Cardiology when available regarding Utility of Cath 2. Acute respiratory failure 2/2 to #1-on Vent-Has been weaned to 40% Fio2 and Rate has been weaned as patient is alkalemic.  Attempt wean and later extubation if possible early  this am 3. Non-oliguric AKI [inital Creat on admit 3.29]c Met Acidosis with with AG=17 [2/2 to Peritonitis /sepsis and renal failure] and compensated resp alkalosis and hypernatremia-etiology sepsis as well as free water deficit. Blood gas from this am shows improvement of acidosis.  Hypernatremia has normalized.  His creatinine is improved.   Bicarb  NS on 04/10/14--continue 75 cc/hr,he is +10 liters for this admission  Nephrology has d/c lasix on 9/6.   4. Hypokalemia-we will replace IV with 4 runs of K IV.  This may be related to him getting lasix which i cut back to 40 IV bid on 04/10/14 5. S/p Surgery Perforated Viscus 04/06/14-Per gen surgery.  Consider GI studies prior to feeding 6. Severe malnutrition-Consider TPN in next 1-2 days if still on Vent and NPO. 7. H/o CAD-MI '87, '97-currently stable.  Rectal asa added 04/10/14 8. HLD-statin on hold 9. Rheumatoid Arthritis on hold   Code Status: DNR not to be re-intubated if extubated and fails Family Communication: spoke with sister at bedside Disposition Plan: [facility] input   Verneita Griffes, MD  [facility] Pager [phone] 04/11/2014, 9:08 AM    LOS: 5 days

## 2014-04-11 NOTE — Progress Notes (Signed)
Note: This document was prepared with digital dictation and possible smart phrase technology. Any transcriptional errors that result from this process are unintentional.   [name] NLZ:[id number] DOB: 05-Mar-1933 DOA: 04/06/2014 PCP: Octavio Graves, DO  Brief narrative: 80 y/o ?, known prior history MI 1987, 1997-status post CABG with residual CAD, CKD since 2009, hypertension, hyperlipidemia, osteoarthritis, rheumatoid arthritis--prior complicated admission 04/08/39 for Mallory-Weiss tear, hemorrhagic shock from anemia blood loss, non-ST MI/Vtach 4 hypokalemia and acute respiratory failure requiring intubation admitted from home on 9/1/515 with generalized abdominal pain found to be hypotensive-CT scan abdomen and pelvis revealed concern for peritonitis and free air in the upper abdomen, left hydronephrosis due to chronic UPJ obstruction. Patient had emergent exploratory laparoscopy 04/06/14, placed on broad-spectrum antibiotic for suspected peritonitis, subsequently had V. tach code blue 04/09/14, intubated for airway protection [facility] assumed care from Dr. Arnoldo Morale medical issues Cardiology, pulmonary were consulted Family refused transport as patient initially was a no code blue however they were okay with perioperative management, but if he were to be extubated and condition deteriorated, do not want him re-intubated or aggressively resuscitated.  Past medical history-As per Problem list Chart reviewed as below- Reviewed  Consultants:  Pulmonology  Cardiology  Nephrology  Procedures:  Intubation day 04/10/14  Triple lumen 04/06/14  NG tube 16 French 04/06/14  Foley catheter 04/06/49  Antibiotics:  Zosyn 04/06/09  Vancomycin 04/09/14  Vent Mode:  [-] PSV;CPAP FiO2 (%):  [40 %] 40 % Set Rate:  [14 bmp-18 bmp] 14 bmp Vt Set:  [50 mL-500 mL] 500 mL PEEP:  [5 cmH20] 5 cmH20 Pressure Support:  [10 [place] Pressure:  [10 cmH20-14 cmH20] 14 cmH20    Subjective  More  alert, pulling at ETT [facility] to verbalize a little says  He feels "fine" denies pain or discomfort anywhere Denies other c/o but looks uncomfortable    Objective    Interim History: None  Telemetry: Sinus, PVCs   Objective: Filed Vitals:   04/11/14 0900 04/11/14 0915 04/11/14 0929 04/11/14 0930  BP: 116/62 96/55  114/57  Pulse: 67 55 65 66  Temp:      TempSrc:      Resp: $Remo'18 14 12 17  'rluXg$ Height:      Weight:      SpO2: 100% 96% 100% 100%    Intake/Output Summary (Last 24 hours) at 04/11/14 0950 Last data filed at 04/11/14 0825  Gross per 24 hour  Intake 2862.32 ml  Output   2750 ml  Net 112.32 ml    Exam:  General: Alert intubated GCS 11T. Cardiovascular: S1-S2 slightly tach cardiac regular Respiratory: Clear anteriorly Abdomen: Soft nontender NG tube as well as area around ostomy seems intact.  Abdomen soft and nontender-drainage~170 Skin no lower extremity edema Neuro grossly intact. Extraocular movements intact power 5/5 and moving limbs on command.  Data Reviewed: Basic Metabolic Panel:  Recent Labs Lab 04/07/14 0506 04/08/14 0409 04/09/14 0427 04/09/14 0947 04/10/14 0436 04/10/14 0622 04/11/14 0511  NA 142 146 149* 150* 145  --  143  K 5.2 4.1 3.6* 3.3* 2.7*  --  3.2*  CL 115* 117* 118* 118* 109  --  108  CO2 13* 15* 13* 11* 19  --  22  GLUCOSE 82 77 68* 85 136*  --  106*  BUN 33* 37* 31* 30* 28*  --  23  CREATININE 3.31* 2.66* 1.89* 1.80* 1.63*  --  1.39*  CALCIUM 7.7* 8.3* 8.6 8.7 8.3*  --  7.7*  MG 1.7 1.6 1.7  --   --  1.7 1.3*  PHOS 4.7* 4.2 3.3  --   --   --  1.7*   Liver Function Tests:  Recent Labs Lab 04/06/14 0653 04/09/14 0947 04/11/14 0511  AST 21 41* 18  ALT $Re'13 26 13  'hYI$ ALKPHOS 61 48 46  BILITOT 0.6 0.6 0.4  PROT 6.4 5.2* 4.5*  ALBUMIN 3.8 2.2* 1.9*    Recent Labs Lab 04/06/14 0653  LIPASE 81*   No results found for this basename: AMMONIA,  in the last 168 hours CBC:  Recent Labs Lab 04/06/14 0653  04/07/14 2104  04/08/14 0409 04/09/14 0427 04/09/14 0947 04/11/14 0511  WBC 15.7*  < > 8.1 7.5 9.0 13.4* 8.3  NEUTROABS 13.2*  --   --   --   --  10.4* 6.9  HGB 14.1  < > 11.9* 11.3* 11.6* 12.6* 11.2*  HCT 42.1  < > 35.6* 34.5* 35.1* 38.6* 32.6*  MCV 85.2  < > 85.4 85.6 86.0 87.3 82.1  PLT 189  < > 147* 144* 160 178 157  < > = values in this interval not displayed. Cardiac Enzymes:  Recent Labs Lab 04/09/14 0947 04/09/14 1220 04/09/14 1603 04/09/14 2212 04/10/14 0436  TROPONINI 2.81* 4.48* 8.00* 11.45* 9.25*   BNP: No components found with this basename: POCBNP,  CBG: No results found for this basename: GLUCAP,  in the last 168 hours  Recent Results (from the past 240 hour(s))  MRSA PCR SCREENING     Status: None   Collection Time    04/06/14  2:13 PM      Result Value Ref Range Status   MRSA by PCR NEGATIVE  NEGATIVE Final   Comment:            The GeneXpert MRSA Assay (FDA     approved for NASAL specimens     only), is one component of a     comprehensive MRSA colonization     surveillance program. It is not     intended to diagnose MRSA     infection nor to guide or     monitor treatment for     MRSA infections.     Studies:              All Imaging reviewed and is as per above notation   Scheduled Meds: . antiseptic oral rinse  7 mL Mouth Rinse BID  . antiseptic oral rinse  7 mL Mouth Rinse q12n4p  . chlorhexidine  15 mL Mouth Rinse BID  . pantoprazole (PROTONIX) IV  40 mg Intravenous Daily  . piperacillin-tazobactam (ZOSYN)  IV  3.375 g Intravenous Q8H  . vancomycin  750 mg Intravenous Q24H   Continuous Infusions: . amiodarone 30 mg/hr (04/11/14 0800)  . fentaNYL infusion INTRAVENOUS 50 mcg/hr (04/11/14 0800)  . sodium chloride 0.45 % with kcl 75 mL/hr at 04/11/14 7588     Assessment/Plan:  1. Vtach Cardiac arrest-2/2 to sepsis-ROSC after 64min with 2 epinephrine, Cardioversion.  Still some cardiac irritability with Episodic Vtach but currently on Amiodarone 30  mcg/hr.  We will keep him on this.  Troponin's are elevated likely 2/2 to recent Vtach arrest.   Echo 04/10/14 shows global hypokinesis.  Daily Magensium.  Will ask Cardiology when available regarding Utility of Cath 2. Acute respiratory failure 2/2 to #1-on Vent-Has been weaned to 40% Fio2 and Rate has been weaned as patient is alkalemic.  Attempt wean and later extubation  if possible early this am 3. Non-oliguric AKI [inital Creat on admit 3.29]c Met Acidosis with with AG=17 [2/2 to Peritonitis /sepsis and renal failure] and compensated resp alkalosis and hypernatremia-etiology sepsis as well as free water deficit. Blood gas from this am shows improvement of acidosis.  Hypernatremia has normalized.  His creatinine is improved.   Bicarb  NS on 04/10/14--continue 75 cc/hr,he is +10 liters for this admission  Nephrology has d/c lasix on 9/6.   4. Hypokalemia-we will replace IV with 4 runs of K IV.  This may be related to him getting lasix which i cut back to 40 IV bid on 04/10/14 5. S/p Surgery Perforated Viscus 04/06/14-Per gen surgery.  Consider GI studies prior to feeding 6. Severe malnutrition-Consider TPN in next 1-2 days if still on Vent and NPO. 7. H/o CAD-MI '87, '97-currently stable.  Rectal asa added 04/10/14 8. HLD-statin on hold 9. Rheumatoid Arthritis on hold  This patient has multiple life threatening medical conditions and required 40 minutes of dedicated critical care time and consult from multiple sub specialists as well as multiple life sustaining and necessary invasive interventions.   Code Status: DNR not to be re-intubated if extubated and fails Family Communication: spoke with sister at bedside Disposition Plan: [facility] input   Verneita Griffes, MD  [facility] Pager [phone] 04/11/2014, 9:50 AM    LOS: 5 days

## 2014-04-11 NOTE — Progress Notes (Addendum)
During extubation, OG tube was removed. I paged Dr. Lovell Sheehan and he is agreeable to leaving OG/NG tube out since pt also had a BM today. MD also wants to give pt small sips of clears tonight, but may advance diet in the morning as to prevent aspiration.

## 2014-04-11 NOTE — Progress Notes (Addendum)
Pt extubated this AM at 1115 by MD and RT. Pt tolerated extubation well. Pt is lethargic, but responsive. Orientation is still questionable at this point as pt responds to his name but is not speaking much. Oxygen saturations remains at 100%. Pt is on oxygen via [place]. Will continue to monitor.

## 2014-04-11 NOTE — Progress Notes (Signed)
PT rate has been decreased from 18 to 14 for high PH 7.5 change after morning ABG.

## 2014-04-11 NOTE — Procedures (Signed)
Extubation Procedure Note  Patient Details:   Name: [name] DOB: Sep 05, 1932 MRN: [id number]   Airway Documentation:     Evaluation  O2 sats: stable throughout Complications: No apparent complications Patient did tolerate procedure well. Bilateral Breath Sounds: Diminished Suctioning: Oral;Airway Yes  [name] [name] 04/11/2014, 5:31 PM

## 2014-04-11 NOTE — Progress Notes (Signed)
5 Days Post-Op  Subjective: Intubated.  Objective: Vital signs in last 24 hours: Temp:  [97 F (36.1 C)-98.9 F (37.2 C)] 98.9 F (37.2 C) (09/06 0800) Pulse Rate:  [45-141] 60 (09/06 0830) Resp:  [14-26] 14 (09/06 0830) BP: (54-152)/(38-101) 108/59 mmHg (09/06 0830) SpO2:  [84 %-100 %] 100 % (09/06 0830) FiO2 (%):  [40 %] 40 % (09/06 0807) Weight:  [57 kg (125 lb 10.6 oz)] 57 kg (125 lb 10.6 oz) (09/06 0500) Last BM Date:  (unknown )  Intake/Output from previous day: 09/05 0701 - 09/06 0700 In: 3384.2 [I.V.:2984.2; IV Piggyback:400] Out: 2770 [Urine:2600; Drains:170] Intake/Output this shift: Total I/O In: 119.2 [I.V.:119.2] Out: 40 [Drains:40]  GI: Soft, flat. JP drainage serous in nature. Incision healing well.  Lab Results:   Recent Labs  04/09/14 0947 04/11/14 0511  WBC 13.4* 8.3  HGB 12.6* 11.2*  HCT 38.6* 32.6*  PLT 178 157   BMET  Recent Labs  04/10/14 0436 04/11/14 0511  NA 145 143  K 2.7* 3.2*  CL 109 108  CO2 19 22  GLUCOSE 136* 106*  BUN 28* 23  CREATININE 1.63* 1.39*  CALCIUM 8.3* 7.7*   PT/INR  Recent Labs  04/09/14 0947  LABPROT 15.6*  INR 1.24    Studies/Results: Dg Chest Port 1 View  04/11/2014   CLINICAL DATA:  Right rib fractures. Cardiac arrest on Friday. Recent abdominal surgery. Perforation.  EXAM: PORTABLE CHEST - 1 VIEW  COMPARISON:  04/09/2014  FINDINGS: Appliances appear in unchanged in position. Normal heart size. Pulmonary vascularity appears prominent possibly due date AP supine technique. Infiltration or atelectasis in the lung bases. Costophrenic angles are not included within the field of view.  IMPRESSION: Infiltration or atelectasis in the lung bases similar to previous study.   Electronically Signed   By: Burman Nieves M.D.   On: 04/11/2014 06:54   Dg Chest Port 1 View  04/09/2014   CLINICAL DATA:  Cardiac arrest  EXAM: PORTABLE CHEST - 1 VIEW  COMPARISON:  Chest radiograph 04/06/2014  FINDINGS: Left subclavian  central venous catheter tip projects at the expected location of the central left brachiocephalic vein. Enteric tube courses inferior to the diaphragm. The endotracheal tube appears to terminate within the distal trachea. Stable cardiac and mediastinal contours. Interval development of heterogeneous opacities right lung base. Probable skin fold projecting over the right lateral hemi thorax. Suggestion of deep sulcus on the left. No large consolidative opacities within left hemi thorax.  IMPRESSION: 1. Suggestion of deep sulcus on the left which is favored to be secondary to patient positioning. Additionally lucency around the aortic arch may be artifactual. Recommend attention on repeat evaluation to exclude the possibility of pneumothorax. 2. Endotracheal tube tip terminates 1.8 cm superior to the carina. 3. New heterogeneous opacities right lung base may represent combination of pleural fluid and/or atelectasis. 4. Lucency within the peripheral right lower hemi thorax is favored to represent skin fold. These results will be called to the ordering clinician or representative by the Radiologist Assistant, and communication documented in the PACS or zVision Dashboard.   Electronically Signed   By: Annia Belt M.D.   On: 04/09/2014 09:56   Dg Chest Port 1v Same Day  04/09/2014   CLINICAL DATA:  Right rib fractures.  Possible pneumothorax.  EXAM: PORTABLE CHEST - 1 VIEW SAME DAY  COMPARISON:  04/09/2014.  FINDINGS: Support devices are in stable position. Right basilar airspace opacity and probable layering effusion, stable. Left basilar atelectasis or  infiltrate also noted. No visible pneumothorax.  IMPRESSION: Bibasilar opacities, right greater than left. Small layering right effusion. No visible pneumothorax.   Electronically Signed   By: Charlett Nose M.D.   On: 04/09/2014 10:39    Anti-infectives: Anti-infectives   Start     Dose/Rate Route Frequency Ordered Stop   04/10/14 1230  vancomycin (VANCOCIN) 500 mg  in sodium chloride 0.9 % 100 mL IVPB     500 mg 100 mL/hr over 60 Minutes Intravenous Every 24 hours 04/09/14 1217     04/09/14 1230  vancomycin (VANCOCIN) IVPB 750 mg/150 ml premix     750 mg 150 mL/hr over 60 Minutes Intravenous  Once 04/09/14 1217 04/09/14 1408   04/09/14 1000  piperacillin-tazobactam (ZOSYN) IVPB 3.375 g     3.375 g 12.5 mL/hr over 240 Minutes Intravenous Every 8 hours 04/09/14 0831     04/06/14 1800  piperacillin-tazobactam (ZOSYN) IVPB 2.25 g  Status:  Discontinued     2.25 g 100 mL/hr over 30 Minutes Intravenous Every 8 hours 04/06/14 1458 04/09/14 0831   04/06/14 1600  piperacillin-tazobactam (ZOSYN) IVPB 3.375 g  Status:  Discontinued     3.375 g 12.5 mL/hr over 240 Minutes Intravenous Every 8 hours 04/06/14 1438 04/06/14 1453   04/06/14 0930  piperacillin-tazobactam (ZOSYN) IVPB 3.375 g     3.375 g 100 mL/hr over 30 Minutes Intravenous  Once 04/06/14 0923 04/06/14 1036      Assessment/Plan: s/p Procedure(s): EXPLORATORY LAPAROTOMY, GRAHAM PLICATION INSERTION CENTRAL LINE ADULT Impression: Patient is on weaning protocol. Family have stated that they do not want him reintubated after extubation. Would leave an NG tube in for now.  LOS: 5 days    Tarrah Furuta A 04/11/2014

## 2014-04-11 NOTE — Progress Notes (Signed)
ANTIBIOTIC CONSULT NOTE  Pharmacy Consult for Zosyn & Vancomycin Indication: intra-abdominal infection, possible aspiration PNA, sepsis  No Known Allergies  Patient Measurements: Height: 5' 6.5" (168.9 cm) Weight: 125 lb 10.6 oz (57 kg) IBW/kg (Calculated) : 64.95  Vital Signs: Temp: 98.9 F (37.2 C) (09/06 0800) Temp src: Axillary (09/06 0800) BP: 114/57 mmHg (09/06 0930) Pulse Rate: 66 (09/06 0930) Intake/Output from previous day: 09/05 0701 - 09/06 0700 In: 3384.2 [I.V.:2984.2; IV Piggyback:400] Out: 2770 [Urine:2600; Drains:170] Intake/Output from this shift: Total I/O In: 119.2 [I.V.:119.2] Out: 40 [Drains:40]  Labs:  Recent Labs  04/09/14 0427 04/09/14 0947 04/10/14 0436 04/11/14 0511  WBC 9.0 13.4*  --  8.3  HGB 11.6* 12.6*  --  11.2*  PLT 160 178  --  157  CREATININE 1.89* 1.80* 1.63* 1.39*   Estimated Creatinine Clearance: 34.7 ml/min (by C-G formula based on Cr of 1.39). No results found for this basename: VANCOTROUGH, Leodis Binet, VANCORANDOM, GENTTROUGH, GENTPEAK, GENTRANDOM, TOBRATROUGH, TOBRAPEAK, TOBRARND, AMIKACINPEAK, AMIKACINTROU, AMIKACIN,  in the last 72 hours   Microbiology: Recent Results (from the past 720 hour(s))  MRSA PCR SCREENING     Status: None   Collection Time    04/06/14  2:13 PM      Result Value Ref Range Status   MRSA by PCR NEGATIVE  NEGATIVE Final   Comment:            The GeneXpert MRSA Assay (FDA     approved for NASAL specimens     only), is one component of a     comprehensive MRSA colonization     surveillance program. It is not     intended to diagnose MRSA     infection nor to guide or     monitor treatment for     MRSA infections.    Medical History: Past Medical History  Diagnosis Date  . Coronary atherosclerosis of native coronary artery     Multivessel  . Essential hypertension, benign   . Mixed hyperlipidemia   . Erectile dysfunction   . Osteoarthritis   . Myocardial infarction     IMI 1987 and  PLMI 1997  . Rheumatoid arthritis, adult     Anti-infectives   Start     Dose/Rate Route Frequency Ordered Stop   04/11/14 1200  vancomycin (VANCOCIN) IVPB 750 mg/150 ml premix     750 mg 150 mL/hr over 60 Minutes Intravenous Every 24 hours 04/11/14 0946     04/10/14 1230  vancomycin (VANCOCIN) 500 mg in sodium chloride 0.9 % 100 mL IVPB  Status:  Discontinued     500 mg 100 mL/hr over 60 Minutes Intravenous Every 24 hours 04/09/14 1217 04/11/14 0946   04/09/14 1230  vancomycin (VANCOCIN) IVPB 750 mg/150 ml premix     750 mg 150 mL/hr over 60 Minutes Intravenous  Once 04/09/14 1217 04/09/14 1408   04/09/14 1000  piperacillin-tazobactam (ZOSYN) IVPB 3.375 g     3.375 g 12.5 mL/hr over 240 Minutes Intravenous Every 8 hours 04/09/14 0831     04/06/14 1800  piperacillin-tazobactam (ZOSYN) IVPB 2.25 g  Status:  Discontinued     2.25 g 100 mL/hr over 30 Minutes Intravenous Every 8 hours 04/06/14 1458 04/09/14 0831   04/06/14 1600  piperacillin-tazobactam (ZOSYN) IVPB 3.375 g  Status:  Discontinued     3.375 g 12.5 mL/hr over 240 Minutes Intravenous Every 8 hours 04/06/14 1438 04/06/14 1453   04/06/14 0930  piperacillin-tazobactam (ZOSYN) IVPB 3.375 g  3.375 g 100 mL/hr over 30 Minutes Intravenous  Once 04/06/14 8588 04/06/14 1036      Assessment: 80yo male admitted with c/o severe abdominal pain found to have peritonitis.  He is s/p repair of perforated peptic ulcer on 04/06/14.   He had episode of unresponsiveness with Vtach today (04/09/14).  Code BLUE was called & was intubated.  CXR shows possible infiltrate.   He had acute renal failure on admission which is improving.  Estimated CrCl 34.7 min today  Zosyn 9/1>> Vancomycin 9/4 >>  Goal of Therapy:  Eradicate infection. Vancomycin trough 15-20  Plan:  Increase Vancomycin to 750 mg IV every 24 hours due to improving renal function Check Vancomycin trough at steady state Continue Zosyn 3.375gm IV Q8h to be infused over  4hrs Monitor labs, renal fxn, progress, and cultures  Raquel James, Nissi Doffing Bennett 04/11/2014,9:53 AM

## 2014-04-12 LAB — BASIC METABOLIC PANEL
Anion gap: 13 (ref 5–15)
BUN: 17 mg/dL (ref 6–23)
CALCIUM: 8 mg/dL — AB (ref 8.4–10.5)
CO2: 23 mEq/L (ref 19–32)
CREATININE: 1.22 mg/dL (ref 0.50–1.35)
Chloride: 108 mEq/L (ref 96–112)
GFR calc Af Amer: 63 mL/min — ABNORMAL LOW (ref >90)
GFR, EST NON AFRICAN AMERICAN: 55 mL/min — AB (ref >90)
GLUCOSE: 89 mg/dL (ref 70–99)
Potassium: 3.6 mEq/L — ABNORMAL LOW (ref 3.7–5.3)
SODIUM: 144 meq/L (ref 137–147)

## 2014-04-12 LAB — CLOSTRIDIUM DIFFICILE BY PCR: Toxigenic C. Difficile by PCR: NEGATIVE

## 2014-04-12 MED ORDER — ENSURE COMPLETE PO LIQD
237.0000 mL | Freq: Two times a day (BID) | ORAL | Status: DC
  Administered 2014-04-12 – 2014-04-13 (×3): 237 mL via ORAL

## 2014-04-12 MED ORDER — PANTOPRAZOLE SODIUM 40 MG PO TBEC
40.0000 mg | DELAYED_RELEASE_TABLET | Freq: Every day | ORAL | Status: DC
  Administered 2014-04-12 – 2014-04-13 (×2): 40 mg via ORAL
  Filled 2014-04-12 (×2): qty 1

## 2014-04-12 MED ORDER — AMIODARONE HCL 200 MG PO TABS
400.0000 mg | ORAL_TABLET | Freq: Two times a day (BID) | ORAL | Status: DC
  Administered 2014-04-12 – 2014-04-13 (×3): 400 mg via ORAL
  Filled 2014-04-12 (×3): qty 2

## 2014-04-12 MED ORDER — METOPROLOL TARTRATE 25 MG PO TABS
12.5000 mg | ORAL_TABLET | Freq: Two times a day (BID) | ORAL | Status: DC
  Administered 2014-04-12 – 2014-04-13 (×3): 12.5 mg via ORAL
  Filled 2014-04-12 (×3): qty 1

## 2014-04-12 MED ORDER — POTASSIUM CHLORIDE 20 MEQ/15ML (10%) PO LIQD
40.0000 meq | Freq: Two times a day (BID) | ORAL | Status: AC
  Administered 2014-04-12 (×2): 40 meq via ORAL
  Filled 2014-04-12 (×2): qty 30

## 2014-04-12 NOTE — Evaluation (Signed)
Physical Therapy Evaluation Patient Details Name: [name] MRN: [id number] DOB: 05/30/1933 Today's Date: 04/12/2014   History of Present Illness  Pt is a 53 year ol male who underwent laparotomy with repair of a gastric ulcer on 04-06-14.  On 04-09-14 he had an MI and needed CPR with ventilator assist.  He was extubated on 04-11-14.  Pt is currently medically stable and MD would like to increase activity level.  Clinical Impression   Pt is alert and very cooperative.  He appears to be very frail with poor muscle bulk, demonstrating generalized weakness.  He is quite limited functionally and needs mod to max assist to transfer OOB.  His knees tend to buckle upon standing and he is unable to ambulate because of this quadriceps weakness.  I am concerned that his family will not be able to manage him at home and am recommending SNF prior to his return home.  Pt is in agreement..    Follow Up Recommendations SNF    Equipment Recommendations  None recommended by PT    Recommendations for Other Services OT consult     Precautions / Restrictions Precautions Precautions: Fall Precaution Comments: pt has a JP drain and healing abdominal incision...he also has a flexi-seal due to diarrhea Restrictions Weight Bearing Restrictions: No      Mobility  Bed Mobility Overal bed mobility: Needs Assistance Bed Mobility: Supine to Sit     Supine to sit: Mod assist        Transfers Overall transfer level: Needs assistance Equipment used: Rolling walker (2 wheeled) Transfers: Sit to/from [employer] Transfers Sit to Stand: Max assist Stand pivot transfers: Max assist       General transfer comment: pt used a walker to pivot from bed to chair, needing max assist to maintain stance....Marland Kitchenknees tended to buckle underneath him.  Ambulation/Gait                Stairs            Wheelchair Mobility    Modified Rankin (Stroke Patients Only)       Balance Overall balance  assessment: Needs assistance Sitting-balance support: Feet supported;No upper extremity supported Sitting balance-Leahy Scale: Good     Standing balance support: Bilateral upper extremity supported Standing balance-Leahy Scale: Poor                               Pertinent Vitals/Pain Pain Assessment: No/denies pain    Home Living Family/patient expects to be discharged to:: Private residence Living Arrangements: Children Available Help at Discharge: Family;Available 24 hours/day Type of Home: House Home Access: Level entry     Home Layout: Two level;Full bath on main level;Able to live on main level with bedroom/bathroom Home Equipment: Walker - 4 wheels;Cane - single point;Shower seat;Grab bars - tub/shower;Wheelchair - IT trainer      Prior Function Level of Independence: Independent with assistive device(s)         Comments: per pt report used cane or walker as needed     Hand Dominance   Dominant Hand: Right    Extremity/Trunk Assessment   Upper Extremity Assessment: Defer to OT evaluation           Lower Extremity Assessment: Generalized weakness      Cervical / Trunk Assessment: Kyphotic  Communication   Communication: No difficulties (appears to have no teeth)  Cognition Arousal/Alertness: Awake/alert Behavior During Therapy: WFL for tasks assessed/performed  Overall Cognitive Status: Within Functional Limits for tasks assessed                      General Comments      Exercises General Exercises - Lower Extremity Ankle Circles/Pumps: AROM;Both;10 reps;Supine Quad Sets: AROM;Both;10 reps;Supine Short Arc Quad: Both;10 reps;Supine;AAROM Heel Slides: AAROM;Both;10 reps;Supine      Assessment/Plan    PT Assessment Patient needs continued PT services  PT Diagnosis Difficulty walking;Generalized weakness   PT Problem List Decreased strength;Decreased activity tolerance;Decreased mobility;Cardiopulmonary  status limiting activity  PT Treatment Interventions DME instruction;Gait training;Functional mobility training;Therapeutic exercise   PT Goals (Current goals can be found in the Care Plan section) Acute Rehab PT Goals Patient Stated Goal: none stated PT Goal Formulation: With patient Time For Goal Achievement: 04/23/14 Potential to Achieve Goals: Fair    Frequency Min 3X/week   Barriers to discharge  pt needs significant assist with all mobility      Co-evaluation               End of Session Equipment Utilized During Treatment: Gait belt Activity Tolerance: Patient limited by fatigue Patient left: in chair;with call bell/phone within reach Nurse Communication: Mobility status         Time: 1314-1406 PT Time Calculation (min): 52 min   Charges:   PT Evaluation $Initial PT Evaluation Tier I: 1 Procedure PT Treatments $Therapeutic Exercise: 8-22 mins $Therapeutic Activity: 8-22 mins   PT G Codes:          Konrad Penta 04/12/2014, 2:19 PM

## 2014-04-12 NOTE — Clinical Social Work Placement (Signed)
Clinical Social Work Department CLINICAL SOCIAL WORK PLACEMENT NOTE 04/12/2014  Patient:  [name], [name]  Account Number:  [id number] Admit date:  04/06/2014  Clinical Social Worker:  Derenda Fennel, LCSW  Date/time:  04/12/2014 03:04 PM  Clinical Social Work is seeking post-discharge placement for this patient at the following level of care:   SKILLED NURSING   (*CSW will update this form in Epic as items are completed)   04/12/2014  Patient/family provided with [facility] Department of Clinical Social Work's list of facilities offering this level of care within the geographic area requested by the patient (or if unable, by the patient's family).  04/12/2014  Patient/family informed of their freedom to choose among providers that offer the needed level of care, that participate in Medicare, Medicaid or managed care program needed by the patient, have an available bed and are willing to accept the patient.  04/12/2014  Patient/family informed of [facility]' ownership interest in [facility], as well as of the fact that they are under no obligation to receive care at this facility.  PASARR submitted to EDS on 04/12/2014 PASARR number received on 04/12/2014  FL2 transmitted to all facilities in geographic area requested by pt/family on  04/12/2014 FL2 transmitted to all facilities within larger geographic area on   Patient informed that his/her managed care company has contracts with or will negotiate with  certain facilities, including the following:     Patient/family informed of bed offers received:   Patient chooses bed at  Physician recommends and patient chooses bed at    Patient to be transferred to  on   Patient to be transferred to facility by  Patient and family notified of transfer on  Name of family member notified:    The following physician request were entered in Epic:   Additional Comments:  Derenda Fennel, LCSW [phone]

## 2014-04-12 NOTE — Progress Notes (Signed)
INITIAL NUTRITION ASSESSMENT  DOCUMENTATION CODES Per approved criteria  -Not Applicable   INTERVENTION: Ensure Complete po BID, each supplement provides 350 kcal and 13 grams of protein  NUTRITION DIAGNOSIS: Inadequate oral intake related to altered GI function as evidenced by s/p ex lap 04/06/14.   Goal: Pt will meet >90% of estimated nutritional needs  Monitor:  Diet advancement, PO/supplement intake, labs, weight changes, I/O's  Reason for Assessment: Low braden  80 y.o. male  Admitting Dx: Cardiac arrest  80 y/o ?, known prior history MI 81, 1997-status post CABG with residual CAD, CKD since 2009, hypertension, hyperlipidemia, osteoarthritis, rheumatoid arthritis--prior complicated admission 02/02/13 for Mallory-Weiss tear, hemorrhagic shock from anemia blood loss, non-ST MI/Vtach 4 hypokalemia and acute respiratory failure requiring intubation admitted from home on 9/1/515 with generalized abdominal pain found to be hypotensive-CT scan abdomen and pelvis revealed concern for peritonitis and free air in the upper abdomen, left hydronephrosis due to chronic UPJ obstruction.  Patient had emergent exploratory laparoscopy 04/06/14, placed on broad-spectrum antibiotic for suspected peritonitis, subsequently had V. tach code blue 04/09/14, intubated for airway protection  Family refused transport as patient initially was a no code blue however they were okay with perioperative management, but if he were to be extubated and condition deteriorated, do not want him re-intubated or aggressively resuscitated.  ASSESSMENT: Pt s/p ex lap on 04/06/14 and was intubated on 04/09/14 for airway protection. Pt family requested that pt not be re intubated.  Pt was extubated on 04/11/14; OGT was also removed at that time. Pt was performing activities of self-care with nursing at time of visit. Unable to complete nutrition focused physical exam at this time.  Noted wt of 143# in 01/2014, however weight appears to  be an outlier. UBW around 120#.  Pt is currently on a full liquid diet; no PO data available at this time. Will add ONS due to restrictions of liquid diet.  Labs reviewed. K: 3.6 (supp), Mg: 1.3, Phos: 1.7, glucose: 106.   Height: Ht Readings from Last 1 Encounters:  04/11/14 5' 6.5" (1.689 m)    Weight: Wt Readings from Last 1 Encounters:  04/12/14 121 lb 11.1 oz (55.2 kg)    Ideal Body Weight: 145#  % Ideal Body Weight: 83%  Wt Readings from Last 10 Encounters:  04/12/14 121 lb 11.1 oz (55.2 kg)  04/12/14 121 lb 11.1 oz (55.2 kg)  12/04/13 115 lb (52.164 kg)  06/29/13 120 lb 8 oz (54.658 kg)  03/19/13 123 lb 12 oz (56.133 kg)  01/29/13 143 lb 15.4 oz (65.3 kg)  01/29/13 143 lb 15.4 oz (65.3 kg)  01/29/13 143 lb 15.4 oz (65.3 kg)  04/17/12 128 lb (58.06 kg)  02/07/12 120 lb (54.432 kg)    Usual Body Weight: 120#  % Usual Body Weight: 101%  BMI:  Body mass index is 19.35 kg/(m^2). Normal weight range  Estimated Nutritional Needs: Kcal: 1600-1800 Protein: 72-82 grams Fluid: 1.6-1.8 L  Skin: closed lt chest incision, closed abdominal incision, JP drain  Diet Order: Full Liquid  EDUCATION NEEDS: -Education not appropriate at this time   Intake/Output Summary (Last 24 hours) at 04/12/14 1009 Last data filed at 04/12/14 0600  Gross per 24 hour  Intake 1134.01 ml  Output   2345 ml  Net -1210.99 ml    Last BM: 04/11/14  Labs:   Recent Labs Lab 04/08/14 0409 04/09/14 0427  04/10/14 0436 04/10/14 0622 04/11/14 0511 04/11/14 2021 04/12/14 0433  NA 146 149*  < >  145  --  143  --  144  K 4.1 3.6*  < > 2.7*  --  3.2* 3.2* 3.6*  CL 117* 118*  < > 109  --  108  --  108  CO2 15* 13*  < > 19  --  22  --  23  BUN 37* 31*  < > 28*  --  23  --  17  CREATININE 2.66* 1.89*  < > 1.63*  --  1.39*  --  1.22  CALCIUM 8.3* 8.6  < > 8.3*  --  7.7*  --  8.0*  MG 1.6 1.7  --   --  1.7 1.3*  --   --   PHOS 4.2 3.3  --   --   --  1.7*  --   --   GLUCOSE 77 68*  < > 136*   --  106*  --  89  < > = values in this interval not displayed.  CBG (last 3)  No results found for this basename: GLUCAP,  in the last 72 hours  Scheduled Meds: . antiseptic oral rinse  7 mL Mouth Rinse BID  . pantoprazole  40 mg Oral Q1200  . potassium chloride  20 mEq Oral BID    Continuous Infusions: . amiodarone 30 mg/hr (04/12/14 0600)  . fentaNYL infusion INTRAVENOUS Stopped (04/11/14 0900)  . sodium chloride 0.45 % with kcl Stopped (04/11/14 2132)    Past Medical History  Diagnosis Date  . Coronary atherosclerosis of native coronary artery     Multivessel  . Essential hypertension, benign   . Mixed hyperlipidemia   . Erectile dysfunction   . Osteoarthritis   . Myocardial infarction     IMI 1987 and PLMI 1997  . Rheumatoid arthritis, adult     Past Surgical History  Procedure Laterality Date  . Coronary artery bypass graft  1987  . Carpal tunnel release    . Rotator cuff repair    . Neck surgery    . Esophagogastroduodenoscopy N/A 01/23/2013    Procedure: ESOPHAGOGASTRODUODENOSCOPY (EGD);  Surgeon: Shirley Friar, MD;  Location: [facility] ENDOSCOPY;  Service: Endoscopy;  Laterality: N/A;  . Esophagogastroduodenoscopy N/A 01/23/2013    Procedure: ESOPHAGOGASTRODUODENOSCOPY (EGD);  Surgeon: Shirley Friar, MD;  Location: [facility] ENDOSCOPY;  Service: Endoscopy;  Laterality: N/A;  . Laparotomy N/A 04/06/2014    Procedure: EXPLORATORY LAPAROTOMY, [place]ious [place];  Surgeon: Dalia Heading, MD;  Location: [facility] ORS;  Service: General;  Laterality: N/A;  . Central venous catheter insertion Left 04/06/2014    Procedure: INSERTION CENTRAL LINE ADULT;  Surgeon: Dalia Heading, MD;  Location: [facility] ORS;  Service: General;  Laterality: Left;    Cheryll Keisler A. Mayford Knife, RD, LDN Pager: [phone]

## 2014-04-12 NOTE — Progress Notes (Signed)
Note: This document was prepared with digital dictation and possible smart phrase technology. Any transcriptional errors that result from this process are unintentional.   [name] WEX:[id number] DOB: 07/05/1933 DOA: 04/06/2014 PCP: Octavio Graves, DO  Brief narrative: 80 y/o ?, known prior history MI 1987, 1997-status post CABG with residual CAD, CKD since 2009, hypertension, hyperlipidemia, osteoarthritis, rheumatoid arthritis--prior complicated admission 9/38/10 for Mallory-Weiss tear, hemorrhagic shock from anemia blood loss, non-ST MI/Vtach 4 hypokalemia and acute respiratory failure requiring intubation admitted from home on 9/1/515 with generalized abdominal pain found to be hypotensive-CT scan abdomen and pelvis revealed concern for peritonitis and free air in the upper abdomen, left hydronephrosis due to chronic UPJ obstruction. Patient had emergent exploratory laparoscopy 04/06/14, placed on broad-spectrum antibiotic for suspected peritonitis, subsequently had V. tach code blue 04/09/14, intubated for airway protection [facility] assumed care from Dr. Arnoldo Morale medical issues Cardiology, pulmonary were consulted Family refused transport as patient initially was a no code blue The patient was successfully extubated 9/6 has been stable since   Past medical history-As per Problem list Chart reviewed as below- Reviewed  Consultants:  Pulmonology  Cardiology  Nephrology  Procedures:  Intubation day 04/10/14  Triple lumen 04/06/14  NG tube 16 French 04/06/14  Foley catheter 04/06/49  Extubated 04/11/14  Antibiotics:  Zosyn 04/06/09-04/12/14  Vancomycin 04/09/14-04/12/14     Subjective   More alert oriented pleasant tolerating clear liquid diet Nursing informs 4 dark loose stools but C. difficile is negative no abdominal pain No chest pain  overall feels weak   Objective    Interim History: None  Telemetry: Sinus, PVCs   Objective: Filed Vitals:   04/12/14 0500 04/12/14  0530 04/12/14 0600 04/12/14 0630  BP: 121/61 138/67 128/65 130/70  Pulse: 57 66 55 63  Temp:      TempSrc:      Resp: $Remo'23 22 23 22  'bGYen$ Height:      Weight: 55.2 kg (121 lb 11.1 oz)     SpO2: 96% 100% 100% 100%    Intake/Output Summary (Last 24 hours) at 04/12/14 0832 Last data filed at 04/12/14 0600  Gross per 24 hour  Intake 1430.88 ml  Output   2345 ml  Net -914.12 ml    Exam:  General: Alert intubated GCS 11T. Cardiovascular: S1-S2 slightly tach cardiac regular Respiratory: Clear anteriorly Abdomen: Soft nontender NG tube as well as area around ostomy seems intact.  Abdomen soft and nontender-drainage~130 Skin no lower extremity edema Neuro grossly intact. Extraocular movements intact power 5/5 and moving limbs on command.  Data Reviewed: Basic Metabolic Panel:  Recent Labs Lab 04/07/14 0506 04/08/14 0409 04/09/14 0427 04/09/14 1751 04/10/14 0436 04/10/14 0622 04/11/14 0511 04/11/14 2021 04/12/14 0433  NA 142 146 149* 150* 145  --  143  --  144  K 5.2 4.1 3.6* 3.3* 2.7*  --  3.2* 3.2* 3.6*  CL 115* 117* 118* 118* 109  --  108  --  108  CO2 13* 15* 13* 11* 19  --  22  --  23  GLUCOSE 82 77 68* 85 136*  --  106*  --  89  BUN 33* 37* 31* 30* 28*  --  23  --  17  CREATININE 3.31* 2.66* 1.89* 1.80* 1.63*  --  1.39*  --  1.22  CALCIUM 7.7* 8.3* 8.6 8.7 8.3*  --  7.7*  --  8.0*  MG 1.7 1.6 1.7  --   --  1.7 1.3*  --   --  PHOS 4.7* 4.2 3.3  --   --   --  1.7*  --   --    Liver Function Tests:  Recent Labs Lab 04/06/14 0653 04/09/14 0947 04/11/14 0511  AST 21 41* 18  ALT $Re'13 26 13  'Snh$ ALKPHOS 61 48 46  BILITOT 0.6 0.6 0.4  PROT 6.4 5.2* 4.5*  ALBUMIN 3.8 2.2* 1.9*    Recent Labs Lab 04/06/14 0653  LIPASE 81*   No results found for this basename: AMMONIA,  in the last 168 hours CBC:  Recent Labs Lab 04/06/14 0653  04/07/14 2104 04/08/14 0409 04/09/14 0427 04/09/14 0947 04/11/14 0511  WBC 15.7*  < > 8.1 7.5 9.0 13.4* 8.3  NEUTROABS 13.2*  --   --    --   --  10.4* 6.9  HGB 14.1  < > 11.9* 11.3* 11.6* 12.6* 11.2*  HCT 42.1  < > 35.6* 34.5* 35.1* 38.6* 32.6*  MCV 85.2  < > 85.4 85.6 86.0 87.3 82.1  PLT 189  < > 147* 144* 160 178 157  < > = values in this interval not displayed. Cardiac Enzymes:  Recent Labs Lab 04/09/14 0947 04/09/14 1220 04/09/14 1603 04/09/14 2212 04/10/14 0436  TROPONINI 2.81* 4.48* 8.00* 11.45* 9.25*   BNP: No components found with this basename: POCBNP,  CBG: No results found for this basename: GLUCAP,  in the last 168 hours  Recent Results (from the past 240 hour(s))  MRSA PCR SCREENING     Status: None   Collection Time    04/06/14  2:13 PM      Result Value Ref Range Status   MRSA by PCR NEGATIVE  NEGATIVE Final   Comment:            The GeneXpert MRSA Assay (FDA     approved for NASAL specimens     only), is one component of a     comprehensive MRSA colonization     surveillance program. It is not     intended to diagnose MRSA     infection nor to guide or     monitor treatment for     MRSA infections.     Studies:              All Imaging reviewed and is as per above notation   Scheduled Meds: . antiseptic oral rinse  7 mL Mouth Rinse BID  . pantoprazole (PROTONIX) IV  40 mg Intravenous Daily  . potassium chloride  20 mEq Oral BID  . vancomycin  750 mg Intravenous Q24H   Continuous Infusions: . amiodarone 30 mg/hr (04/12/14 0600)  . fentaNYL infusion INTRAVENOUS Stopped (04/11/14 0900)  . sodium chloride 0.45 % with kcl Stopped (04/11/14 2132)     Assessment/Plan:  1. Vtach Cardiac arrest-2/2 to sepsis-ROSC after 3min with 2 epinephrine, Cardioversion. Placed on IV amiodarone for prophylaxis from arrhythmia initially,  2 by mouth 400 twice a day 9/7 is tolerating by mouth liquids.  Troponin's are elevated likely 2/2 to recent Vtach arrest.   Echo 04/10/14 shows global hypokinesis.  Repeat magnesium in a.m.-We will get baseline TSH and FT's as might need ot be on longer term AMIO  per cardio.  Metoprolol  from IV to PO 12.5 bid 04/12/14  ? Cardiac cath 2. Acute respiratory failure 2/2 to #1-ventilated 9/4-9/6, currently on O2 nasal cannula, desats screen ordered 3. Non-oliguric AKI [inital Creat on admit 3.29]c Met Acidosis with with AG=17 [2/2 to Peritonitis /sepsis and renal failure] and  compensated resp alkalosis and hypernatremia-etiology sepsis as well as free water deficit. Metabolic abnormalities have completely resolved. Appreciate nephrology assistance and IV saline discontinued 4. Hypokalemia-we will replace orally 40 mg daily repeat magnesium a.m. 5. S/p Surgery Perforated Viscus 04/06/14-Per gen surgery.  Clears per Gen surg-NO oral aspirin for now.  Continue Protonix PO daily 6. Severe malnutrition-Consider TPN. 7. H/o CAD-MI '87, '97-currently stable.  Rectal asa added 04/10/14-consider changing to oral ASA at discretion of Gen surgery 8. HLD-statin on hold 9. Rheumatoid Arthritis on hold  Code Status: DNR not to be re-intubated if extubated and fails Family Communication: no family is present currently Disposition Plan: [place] [facility] input   Verneita Griffes, MD  [facility] Pager [phone] 04/12/2014, 8:32 AM    LOS: 6 days

## 2014-04-12 NOTE — Progress Notes (Signed)
Subjective: Interval History: Patient alert and denies any nausea or vomiting presently on liquid diet and seems tolerating  Objective: Vital signs in last 24 hours: Temp:  [97.4 F (36.3 C)-98.5 F (36.9 C)] 97.7 F (36.5 C) (09/07 0400) Pulse Rate:  [47-140] 62 (09/07 1100) Resp:  [17-28] 20 (09/07 1200) BP: (103-153)/(50-97) 136/71 mmHg (09/07 1200) SpO2:  [78 %-100 %] 80 % (09/07 1100) Weight:  [55.2 kg (121 lb 11.1 oz)] 55.2 kg (121 lb 11.1 oz) (09/07 0500) Weight change: -1.8 kg (-3 lb 15.5 oz)  Intake/Output from previous day: 09/06 0701 - 09/07 0700 In: 1552.8 [I.V.:1252.8; IV Piggyback:300] Out: 2385 [Urine:2255; Drains:130] Intake/Output this shift: Total I/O In: 100.2 [I.V.:100.2] Out: 50 [Drains:50] Generally: Patient alert in no apparent distress and answer questions Chest clear Heart RRR no murmur Extremities no edema   Lab Results:  Recent Labs  04/11/14 0511  WBC 8.3  HGB 11.2*  HCT 32.6*  PLT 157   BMET:   Recent Labs  04/11/14 0511 04/11/14 2021 04/12/14 0433  NA 143  --  144  K 3.2* 3.2* 3.6*  CL 108  --  108  CO2 22  --  23  GLUCOSE 106*  --  89  BUN 23  --  17  CREATININE 1.39*  --  1.22  CALCIUM 7.7*  --  8.0*   No results found for this basename: PTH,  in the last 72 hours Iron Studies: No results found for this basename: IRON, TIBC, TRANSFERRIN, FERRITIN,  in the last 72 hours  Studies/Results: Dg Chest Port 1 View  04/11/2014   CLINICAL DATA:  Right rib fractures. Cardiac arrest on Friday. Recent abdominal surgery. Perforation.  EXAM: PORTABLE CHEST - 1 VIEW  COMPARISON:  04/09/2014  FINDINGS: Appliances appear in unchanged in position. Normal heart size. Pulmonary vascularity appears prominent possibly due date AP supine technique. Infiltration or atelectasis in the lung bases. Costophrenic angles are not included within the field of view.  IMPRESSION: Infiltration or atelectasis in the lung bases similar to previous study.    Electronically Signed   By: Burman Nieves M.D.   On: 04/11/2014 06:54    I have reviewed the patient's current medications.  Assessment/Plan: Problem #1 acute kidney injury: His renal function is progessively improving . Presently his BUN and creatinine has returned to his base line Problem #2 status post surgery for perforated ulcer. Patient is recovering Problem #3  Acid base balance: Presently patient is developing Respiratory Alkalosis. The rate has been dicreased Problem #4 metabolic bone disease: His calcium and phosphorus is range Problem #5 history of chronic kidney disease stage III. Problem #6 anemia: His hemoglobin is good and better Problem #7 hypotension: Problem#8 Hypernatremia : Sodium has corrected. Problem #9Hypokalemia: His is on potassium is 3.6 corrected Problem#10 S/P V.tack/MI Plan: We'll d/c ivf with potassium Since his renal function has returned to his base line I will sign off Thank you for letting me participate in his care   LOS: 6 days   Geordie Nooney S 04/12/2014,12:57 PM

## 2014-04-12 NOTE — Progress Notes (Signed)
6 Days Post-Op  Subjective: Resting comfortably, no abdominal pain.  Objective: Vital signs in last 24 hours: Temp:  [97.4 F (36.3 C)-98.5 F (36.9 C)] 97.7 F (36.5 C) (09/07 0400) Pulse Rate:  [47-140] 63 (09/07 0630) Resp:  [10-28] 22 (09/07 0630) BP: (96-153)/(49-97) 130/70 mmHg (09/07 0630) SpO2:  [78 %-100 %] 100 % (09/07 0630) FiO2 (%):  [40 %] 40 % (09/06 0929) Weight:  [55.2 kg (121 lb 11.1 oz)] 55.2 kg (121 lb 11.1 oz) (09/07 0500) Last BM Date: 04/11/14  Intake/Output from previous day: 09/06 0701 - 09/07 0700 In: 1552.8 [I.V.:1252.8; IV Piggyback:300] Out: 2385 [Urine:2255; Drains:130] Intake/Output this shift:    General appearance: alert, cooperative and no distress GI: soft, non-tender; bowel sounds normal; no masses,  no organomegaly and Incision healing well. JP drainage serous in nature.  Lab Results:   Recent Labs  04/09/14 0947 04/11/14 0511  WBC 13.4* 8.3  HGB 12.6* 11.2*  HCT 38.6* 32.6*  PLT 178 157   BMET  Recent Labs  04/11/14 0511 04/11/14 2021 04/12/14 0433  NA 143  --  144  K 3.2* 3.2* 3.6*  CL 108  --  108  CO2 22  --  23  GLUCOSE 106*  --  89  BUN 23  --  17  CREATININE 1.39*  --  1.22  CALCIUM 7.7*  --  8.0*   PT/INR  Recent Labs  04/09/14 0947  LABPROT 15.6*  INR 1.24    Studies/Results: Dg Chest Port 1 View  04/11/2014   CLINICAL DATA:  Right rib fractures. Cardiac arrest on Friday. Recent abdominal surgery. Perforation.  EXAM: PORTABLE CHEST - 1 VIEW  COMPARISON:  04/09/2014  FINDINGS: Appliances appear in unchanged in position. Normal heart size. Pulmonary vascularity appears prominent possibly due date AP supine technique. Infiltration or atelectasis in the lung bases. Costophrenic angles are not included within the field of view.  IMPRESSION: Infiltration or atelectasis in the lung bases similar to previous study.   Electronically Signed   By: Burman Nieves M.D.   On: 04/11/2014 06:54     Anti-infectives: Anti-infectives   Start     Dose/Rate Route Frequency Ordered Stop   04/11/14 1200  vancomycin (VANCOCIN) IVPB 750 mg/150 ml premix  Status:  Discontinued     750 mg 150 mL/hr over 60 Minutes Intravenous Every 24 hours 04/11/14 0946 04/12/14 0834   04/10/14 1230  vancomycin (VANCOCIN) 500 mg in sodium chloride 0.9 % 100 mL IVPB  Status:  Discontinued     500 mg 100 mL/hr over 60 Minutes Intravenous Every 24 hours 04/09/14 1217 04/11/14 0946   04/09/14 1230  vancomycin (VANCOCIN) IVPB 750 mg/150 ml premix     750 mg 150 mL/hr over 60 Minutes Intravenous  Once 04/09/14 1217 04/09/14 1408   04/09/14 1000  piperacillin-tazobactam (ZOSYN) IVPB 3.375 g  Status:  Discontinued     3.375 g 12.5 mL/hr over 240 Minutes Intravenous Every 8 hours 04/09/14 0831 04/11/14 1952   04/06/14 1800  piperacillin-tazobactam (ZOSYN) IVPB 2.25 g  Status:  Discontinued     2.25 g 100 mL/hr over 30 Minutes Intravenous Every 8 hours 04/06/14 1458 04/09/14 0831   04/06/14 1600  piperacillin-tazobactam (ZOSYN) IVPB 3.375 g  Status:  Discontinued     3.375 g 12.5 mL/hr over 240 Minutes Intravenous Every 8 hours 04/06/14 1438 04/06/14 1453   04/06/14 0930  piperacillin-tazobactam (ZOSYN) IVPB 3.375 g     3.375 g 100 mL/hr over 30 Minutes  Intravenous  Once 04/06/14 7416 04/06/14 1036      Assessment/Plan: s/p Procedure(s): EXPLORATORY LAPAROTOMY, GRAHAM PLICATION INSERTION CENTRAL LINE ADULT Impression: Patient is tolerating extubation well. No apparent gastric contents and JP drainage. Abdomen is soft. Will advance diet as tolerated. Will remove Foley. May switch medications to by mouth as needed.  LOS: 6 days    [name] A 04/12/2014

## 2014-04-12 NOTE — Clinical Social Work Psychosocial (Signed)
Clinical Social Work Department BRIEF PSYCHOSOCIAL ASSESSMENT 04/12/2014  Patient:  [name], [name]     Account Number:  [id number]     Admit date:  04/06/2014  Clinical Social Worker:  Wyatt Haste  Date/Time:  04/12/2014 03:06 PM  Referred by:  Physician  Date Referred:  04/12/2014 Referred for  SNF Placement   Other Referral:   Interview type:  Patient Other interview type:   daughter- Daleen Snook    PSYCHOSOCIAL DATA Living Status:  FAMILY Admitted from facility:   Level of care:   Primary support name:  Daleen Snook Primary support relationship to patient:  CHILD, ADULT Degree of support available:   supportive    CURRENT CONCERNS Current Concerns  Post-Acute Placement   Other Concerns:    SOCIAL WORK ASSESSMENT / PLAN CSW met with pt at bedside. Pt alert and oriented and reports his daughter, Daleen Snook and her two teenage boys live with him. Pt came to ED last week due to abdominal pain. He had emergency surgery for perforated viscus. Several days later, pt coded. He was extubated yesterday. Pt reports at baseline, he is fairly independent with ADLs. He generally ambulates with a cane or a walker. Daleen Snook and her sons take care of household tasks and provide transportation to pt since he stopped driving last year. Daleen Snook works 7-5:30 and the boys are in school, so pt is usually alone during the day. PT evaluated pt and recommendation is for SNF. CSW discussed this with pt who is agreeable. CSW also discussed different options for facilities to consider and pt requested that [place] call [place]. Daleen Snook requests either [facility] or [facility] and is aware of possible copays. She reports that she moved in with pt at his request after he was diagnosed with dementia. CSW provided support as she shared her feelings regarding pt's status over the last week.   Assessment/plan status:  Psychosocial Support/Ongoing Assessment of Needs Other assessment/ plan:   Information/referral to community  resources:   SNF list    PATIENT'S/FAMILY'S RESPONSE TO PLAN OF CARE: Pt and pt's daughter Daleen Snook feel that pt would benefit from short term rehab prior to returning home. CSW will initiate bed search and follow up with offers when available.       Benay Pike, [place]

## 2014-04-12 NOTE — Progress Notes (Signed)
Subjective: He was successfully extubated and is doing well. He has no new complaints.  Objective: Vital signs in last 24 hours: Temp:  [97.4 F (36.3 C)-98.5 F (36.9 C)] 97.7 F (36.5 C) (09/07 0400) Pulse Rate:  [47-140] 63 (09/07 0630) Resp:  [10-28] 22 (09/07 0630) BP: (96-153)/(49-97) 130/70 mmHg (09/07 0630) SpO2:  [78 %-100 %] 100 % (09/07 0630) FiO2 (%):  [40 %] 40 % (09/06 0929) Weight:  [55.2 kg (121 lb 11.1 oz)] 55.2 kg (121 lb 11.1 oz) (09/07 0500) Weight change: -1.8 kg (-3 lb 15.5 oz) Last BM Date: 04/11/14  Intake/Output from previous day: 09/06 0701 - 09/07 0700 In: 1552.8 [I.V.:1252.8; IV Piggyback:300] Out: 2385 [Urine:2255; Drains:130]  PHYSICAL EXAM General appearance: alert, cooperative and no distress Resp: clear to auscultation bilaterally Cardio: His heart seems regular now. GI: Recent surgery. Bowel sounds are present Extremities: extremities normal, atraumatic, no cyanosis or edema  Lab Results:  Results for orders placed during the hospital encounter of 04/06/14 (from the past 48 hour(s))  LACTIC ACID, PLASMA     Status: None   Collection Time    04/10/14  9:13 AM      Result Value Ref Range   Lactic Acid, Venous 1.4  0.5 - 2.2 mmol/L  PHOSPHORUS     Status: Abnormal   Collection Time    04/11/14  5:11 AM      Result Value Ref Range   Phosphorus 1.7 (*) 2.3 - 4.6 mg/dL  COMPREHENSIVE METABOLIC PANEL     Status: Abnormal   Collection Time    04/11/14  5:11 AM      Result Value Ref Range   Sodium 143  137 - 147 mEq/L   Potassium 3.2 (*) 3.7 - 5.3 mEq/L   Chloride 108  96 - 112 mEq/L   CO2 22  19 - 32 mEq/L   Glucose, Bld 106 (*) 70 - 99 mg/dL   BUN 23  6 - 23 mg/dL   Creatinine, Ser 1.39 (*) 0.50 - 1.35 mg/dL   Calcium 7.7 (*) 8.4 - 10.5 mg/dL   Total Protein 4.5 (*) 6.0 - 8.3 g/dL   Albumin 1.9 (*) 3.5 - 5.2 g/dL   AST 18  0 - 37 U/L   ALT 13  0 - 53 U/L   Alkaline Phosphatase 46  39 - 117 U/L   Total Bilirubin 0.4  0.3 - 1.2  mg/dL   GFR calc non Af Amer 47 (*) >90 mL/min   GFR calc Af Amer 54 (*) >90 mL/min   Comment: (NOTE)     The eGFR has been calculated using the CKD EPI equation.     This calculation has not been validated in all clinical situations.     eGFR's persistently <90 mL/min signify possible Chronic Kidney     Disease.   Anion gap 13  5 - 15  CBC WITH DIFFERENTIAL     Status: Abnormal   Collection Time    04/11/14  5:11 AM      Result Value Ref Range   WBC 8.3  4.0 - 10.5 K/uL   RBC 3.97 (*) 4.22 - 5.81 MIL/uL   Hemoglobin 11.2 (*) 13.0 - 17.0 g/dL   HCT 32.6 (*) 39.0 - 52.0 %   MCV 82.1  78.0 - 100.0 fL   MCH 28.2  26.0 - 34.0 pg   MCHC 34.4  30.0 - 36.0 g/dL   RDW 16.6 (*) 11.5 - 15.5 %   Platelets  157  150 - 400 K/uL   Neutrophils Relative % 84 (*) 43 - 77 %   Neutro Abs 6.9  1.7 - 7.7 K/uL   Lymphocytes Relative 8 (*) 12 - 46 %   Lymphs Abs 0.7  0.7 - 4.0 K/uL   Monocytes Relative 7  3 - 12 %   Monocytes Absolute 0.6  0.1 - 1.0 K/uL   Eosinophils Relative 1  0 - 5 %   Eosinophils Absolute 0.1  0.0 - 0.7 K/uL   Basophils Relative 0  0 - 1 %   Basophils Absolute 0.0  0.0 - 0.1 K/uL  MAGNESIUM     Status: Abnormal   Collection Time    04/11/14  5:11 AM      Result Value Ref Range   Magnesium 1.3 (*) 1.5 - 2.5 mg/dL  BLOOD GAS, ARTERIAL     Status: Abnormal   Collection Time    04/11/14  5:34 AM      Result Value Ref Range   FIO2 0.40     Delivery systems VENTILATOR     Mode PRESSURE REGULATED VOLUME CONTROL     VT 500     Rate 18     Peep/cpap 5.0     pH, Arterial 7.511 (*) 7.350 - 7.450   pCO2 arterial 25.3 (*) 35.0 - 45.0 mmHg   pO2, Arterial 115.0 (*) 80.0 - 100.0 mmHg   Bicarbonate 20.1  20.0 - 24.0 mEq/L   TCO2 17.4  0 - 100 mmol/L   Acid-base deficit 2.6 (*) 0.0 - 2.0 mmol/L   O2 Saturation 98.3     Patient temperature 37.0     Collection site BRACHIAL ARTERY     Drawn by [id number]     Sample type ARTERIAL     Allens test (pass/fail) NOT INDICATED (*) PASS   BLOOD GAS, ARTERIAL     Status: Abnormal   Collection Time    04/11/14 10:20 AM      Result Value Ref Range   FIO2 0.40     O2 Content 40.0     Delivery systems VENTILATOR     Mode CONTINUOUS POSITIVE AIRWAY PRESSURE     Peep/cpap 5.0     Pressure support 10     pH, Arterial 7.455 (*) 7.350 - 7.450   pCO2 arterial 29.2 (*) 35.0 - 45.0 mmHg   pO2, Arterial 108.0 (*) 80.0 - 100.0 mmHg   Bicarbonate 20.2  20.0 - 24.0 mEq/L   TCO2 18.1  0 - 100 mmol/L   Acid-base deficit 3.1 (*) 0.0 - 2.0 mmol/L   O2 Saturation 97.8     Patient temperature 37.0     Collection site LEFT RADIAL     Drawn by [id number]     Sample type ARTERIAL     Allens test (pass/fail) PASS  PASS  POTASSIUM     Status: Abnormal   Collection Time    04/11/14  8:21 PM      Result Value Ref Range   Potassium 3.2 (*) 3.7 - 5.3 mEq/L  BASIC METABOLIC PANEL     Status: Abnormal   Collection Time    04/12/14  4:33 AM      Result Value Ref Range   Sodium 144  137 - 147 mEq/L   Potassium 3.6 (*) 3.7 - 5.3 mEq/L   Chloride 108  96 - 112 mEq/L   CO2 23  19 - 32 mEq/L   Glucose, Bld 89  70 -  99 mg/dL   BUN 17  6 - 23 mg/dL   Creatinine, Ser 1.22  0.50 - 1.35 mg/dL   Calcium 8.0 (*) 8.4 - 10.5 mg/dL   GFR calc non Af Amer 55 (*) >90 mL/min   GFR calc Af Amer 63 (*) >90 mL/min   Comment: (NOTE)     The eGFR has been calculated using the CKD EPI equation.     This calculation has not been validated in all clinical situations.     eGFR's persistently <90 mL/min signify possible Chronic Kidney     Disease.   Anion gap 13  5 - 15    ABGS  Recent Labs  04/11/14 1020  PHART 7.455*  PO2ART 108.0*  TCO2 18.1  HCO3 20.2   CULTURES Recent Results (from the past 240 hour(s))  MRSA PCR SCREENING     Status: None   Collection Time    04/06/14  2:13 PM      Result Value Ref Range Status   MRSA by PCR NEGATIVE  NEGATIVE Final   Comment:            The GeneXpert MRSA Assay (FDA     approved for NASAL specimens      only), is one component of a     comprehensive MRSA colonization     surveillance program. It is not     intended to diagnose MRSA     infection nor to guide or     monitor treatment for     MRSA infections.   Studies/Results: Dg Chest Port 1 View  04/11/2014   CLINICAL DATA:  Right rib fractures. Cardiac arrest on Friday. Recent abdominal surgery. Perforation.  EXAM: PORTABLE CHEST - 1 VIEW  COMPARISON:  04/09/2014  FINDINGS: Appliances appear in unchanged in position. Normal heart size. Pulmonary vascularity appears prominent possibly due date AP supine technique. Infiltration or atelectasis in the lung bases. Costophrenic angles are not included within the field of view.  IMPRESSION: Infiltration or atelectasis in the lung bases similar to previous study.   Electronically Signed   By: Lucienne Capers M.D.   On: 04/11/2014 06:54    Medications:  Prior to Admission:  Prescriptions prior to admission  Medication Sig Dispense Refill  . atenolol (TENORMIN) 25 MG tablet Take 25 mg by mouth daily.       . AVODART 0.5 MG capsule Take 0.5 mg by mouth daily.       . citalopram (CELEXA) 10 MG tablet Take 20 mg by mouth daily.       Marland Kitchen DIOVAN 80 MG tablet Take 80 mg by mouth daily.       Marland Kitchen ezetimibe (ZETIA) 10 MG tablet Take 10 mg by mouth daily.        . furosemide (LASIX) 20 MG tablet Take 1 tablet (20 mg total) by mouth daily.  30 tablet  6  . nitroGLYCERIN (NITROSTAT) 0.4 MG SL tablet Place 1 tablet (0.4 mg total) under the tongue every 5 (five) minutes as needed for chest pain.  25 tablet  6  . oxyCODONE (ROXICODONE) 15 MG immediate release tablet Take 15 mg by mouth every 4 (four) hours as needed for pain.      . potassium chloride (K-DUR) 10 MEQ tablet Take 10 mEq by mouth 2 (two) times daily.      . risperiDONE (RISPERDAL) 0.5 MG tablet Take 0.5 mg by mouth 2 (two) times daily.      . simvastatin (ZOCOR) 20  MG tablet Take 20 mg by mouth at bedtime.       . tamsulosin (FLOMAX) 0.4 MG CAPS  capsule Take 0.4 mg by mouth daily after supper.       . Thiamine HCl (VITAMIN B-1) 250 MG tablet Take 250 mg by mouth daily.        Marland Kitchen zolpidem (AMBIEN) 10 MG tablet Take 10 mg by mouth at bedtime as needed for sleep.       Scheduled: . antiseptic oral rinse  7 mL Mouth Rinse BID  . pantoprazole  40 mg Oral Q1200  . potassium chloride  20 mEq Oral BID   Continuous: . amiodarone 30 mg/hr (04/12/14 0600)  . fentaNYL infusion INTRAVENOUS Stopped (04/11/14 0900)  . sodium chloride 0.45 % with kcl Stopped (04/11/14 2132)   ZOX:WRUEAVWU, fentaNYL, LORazepam, metoprolol, morphine injection, ondansetron (ZOFRAN) IV  Assesment: He was a new GI bleed and perforated peptic ulcer. This was complicated by ventricular tachycardia cardiac and pulmonary arrest intubation with mechanical ventilation. He has come off the ventilator now. His pulmonary status is doing much better. Principal Problem:   Cardiac arrest Active Problems:   Coronary atherosclerosis of native coronary artery   Rheumatoid arthritis, adult   Upper GI bleed   Perforated peptic ulcer    Plan: Continue treatments    LOS: 6 days   Tony Granquist L 04/12/2014, 9:08 AM

## 2014-04-12 NOTE — Plan of Care (Signed)
Problem: Phase I Progression Outcomes Goal: Initial discharge plan identified Outcome: Completed/Met Date Met:  04/12/14 SNF

## 2014-04-13 DIAGNOSIS — I2589 Other forms of chronic ischemic heart disease: Secondary | ICD-10-CM

## 2014-04-13 DIAGNOSIS — I469 Cardiac arrest, cause unspecified: Secondary | ICD-10-CM

## 2014-04-13 DIAGNOSIS — I2510 Atherosclerotic heart disease of native coronary artery without angina pectoris: Secondary | ICD-10-CM

## 2014-04-13 DIAGNOSIS — I10 Essential (primary) hypertension: Secondary | ICD-10-CM

## 2014-04-13 DIAGNOSIS — I255 Ischemic cardiomyopathy: Secondary | ICD-10-CM

## 2014-04-13 DIAGNOSIS — K275 Chronic or unspecified peptic ulcer, site unspecified, with perforation: Principal | ICD-10-CM

## 2014-04-13 LAB — COMPREHENSIVE METABOLIC PANEL
ALT: 18 U/L (ref 0–53)
AST: 21 U/L (ref 0–37)
Albumin: 2.2 g/dL — ABNORMAL LOW (ref 3.5–5.2)
Alkaline Phosphatase: 66 U/L (ref 39–117)
Anion gap: 14 (ref 5–15)
BUN: 12 mg/dL (ref 6–23)
CALCIUM: 8.5 mg/dL (ref 8.4–10.5)
CO2: 20 mEq/L (ref 19–32)
CREATININE: 0.95 mg/dL (ref 0.50–1.35)
Chloride: 109 mEq/L (ref 96–112)
GFR, EST AFRICAN AMERICAN: 89 mL/min — AB (ref >90)
GFR, EST NON AFRICAN AMERICAN: 77 mL/min — AB (ref >90)
Glucose, Bld: 117 mg/dL — ABNORMAL HIGH (ref 70–99)
Potassium: 4.5 mEq/L (ref 3.7–5.3)
SODIUM: 143 meq/L (ref 137–147)
TOTAL PROTEIN: 5.3 g/dL — AB (ref 6.0–8.3)
Total Bilirubin: 0.5 mg/dL (ref 0.3–1.2)

## 2014-04-13 LAB — MAGNESIUM: MAGNESIUM: 1.4 mg/dL — AB (ref 1.5–2.5)

## 2014-04-13 MED ORDER — SIMVASTATIN 20 MG PO TABS: 20.0000 mg | ORAL_TABLET | Freq: Every day | ORAL | Status: DC

## 2014-04-13 MED ORDER — OXYCODONE HCL 15 MG PO TABS
15.0000 mg | ORAL_TABLET | ORAL | Status: DC | PRN
Start: 2014-04-13 — End: 2014-09-07

## 2014-04-13 MED ORDER — FUROSEMIDE 20 MG PO TABS
20.0000 mg | ORAL_TABLET | Freq: Every day | ORAL | Status: DC
  Administered 2014-04-13: 20 mg via ORAL
  Filled 2014-04-13: qty 1

## 2014-04-13 MED ORDER — MAGNESIUM OXIDE 400 (241.3 MG) MG PO TABS: 400.0000 mg | ORAL_TABLET | Freq: Every day | ORAL | Status: AC

## 2014-04-13 MED ORDER — RISPERIDONE 0.5 MG PO TABS: 0.5000 mg | ORAL_TABLET | Freq: Two times a day (BID) | ORAL | Status: DC

## 2014-04-13 MED ORDER — CITALOPRAM HYDROBROMIDE 10 MG PO TABS
20.0000 mg | ORAL_TABLET | Freq: Every day | ORAL | Status: AC
Start: 2014-04-13 — End: ?

## 2014-04-13 MED ORDER — DUTASTERIDE 0.5 MG PO CAPS
0.5000 mg | ORAL_CAPSULE | Freq: Every day | ORAL | Status: DC
  Administered 2014-04-13: 0.5 mg via ORAL
  Filled 2014-04-13 (×2): qty 1

## 2014-04-13 MED ORDER — OXYCODONE HCL 15 MG PO TABS: 15.0000 mg | ORAL_TABLET | ORAL | Status: DC | PRN

## 2014-04-13 MED ORDER — RISPERIDONE 0.5 MG PO TABS
0.5000 mg | ORAL_TABLET | Freq: Two times a day (BID) | ORAL | Status: DC
  Administered 2014-04-13: 0.5 mg via ORAL
  Filled 2014-04-13: qty 1

## 2014-04-13 MED ORDER — CITALOPRAM HYDROBROMIDE 20 MG PO TABS
20.0000 mg | ORAL_TABLET | Freq: Every day | ORAL | Status: DC
  Administered 2014-04-13: 20 mg via ORAL
  Filled 2014-04-13: qty 1

## 2014-04-13 MED ORDER — AMIODARONE HCL 400 MG PO TABS: 400.0000 mg | ORAL_TABLET | Freq: Two times a day (BID) | ORAL | Status: DC

## 2014-04-13 MED ORDER — EZETIMIBE 10 MG PO TABS
10.0000 mg | ORAL_TABLET | Freq: Every day | ORAL | Status: DC
  Administered 2014-04-13: 10 mg via ORAL
  Filled 2014-04-13: qty 1

## 2014-04-13 MED ORDER — METOPROLOL TARTRATE 12.5 MG HALF TABLET: 12.5000 mg | ORAL_TABLET | Freq: Two times a day (BID) | ORAL | Status: DC

## 2014-04-13 MED ORDER — ZOLPIDEM TARTRATE 5 MG PO TABS: 5.0000 mg | ORAL_TABLET | Freq: Every evening | ORAL | Status: DC | PRN

## 2014-04-13 MED ORDER — POTASSIUM CHLORIDE CRYS ER 10 MEQ PO TBCR
10.0000 meq | EXTENDED_RELEASE_TABLET | Freq: Two times a day (BID) | ORAL | Status: DC
  Administered 2014-04-13: 10 meq via ORAL
  Filled 2014-04-13 (×4): qty 1

## 2014-04-13 MED ORDER — TRAMADOL HCL 50 MG PO TABS
50.0000 mg | ORAL_TABLET | Freq: Once | ORAL | Status: AC
  Administered 2014-04-13: 50 mg via ORAL
  Filled 2014-04-13: qty 1

## 2014-04-13 MED ORDER — VALSARTAN 40 MG PO TABS: 40.0000 mg | ORAL_TABLET | Freq: Every day | ORAL | Status: DC

## 2014-04-13 MED ORDER — IRBESARTAN 75 MG PO TABS
75.0000 mg | ORAL_TABLET | Freq: Every day | ORAL | Status: DC
  Administered 2014-04-13: 75 mg via ORAL
  Filled 2014-04-13: qty 1

## 2014-04-13 MED ORDER — TAMSULOSIN HCL 0.4 MG PO CAPS: 0.4000 mg | ORAL_CAPSULE | Freq: Every day | ORAL | Status: DC

## 2014-04-13 MED ORDER — LISINOPRIL 5 MG PO TABS: 2.5000 mg | ORAL_TABLET | Freq: Every day | ORAL | Status: DC

## 2014-04-13 MED ORDER — PANTOPRAZOLE SODIUM 40 MG PO TBEC: 40.0000 mg | DELAYED_RELEASE_TABLET | Freq: Every day | ORAL | Status: AC

## 2014-04-13 MED FILL — Medication: Qty: 1 | Status: AC

## 2014-04-13 NOTE — Progress Notes (Signed)
Consulting cardiologist: Dina Rich MD Primary Cardiologist: Nona Dell MD  Subjective:    "My stomach hurts."  Denies chest pain or dyspnea.   Objective:   Temp:  [97.5 F (36.4 C)-97.8 F (36.6 C)] 97.6 F (36.4 C) (09/08 0757) Pulse Rate:  [31-129] 71 (09/08 0830) Resp:  [16-30] 23 (09/08 0830) BP: (125-157)/(57-114) 156/71 mmHg (09/08 0830) SpO2:  [80 %-100 %] 100 % (09/08 0830) Weight:  [123 lb 10.9 oz (56.1 kg)] 123 lb 10.9 oz (56.1 kg) (09/08 0500) Last BM Date: 04/13/14  Filed Weights   04/11/14 0500 04/12/14 0500 04/13/14 0500  Weight: 125 lb 10.6 oz (57 kg) 121 lb 11.1 oz (55.2 kg) 123 lb 10.9 oz (56.1 kg)    Intake/Output Summary (Last 24 hours) at 04/13/14 0908 Last data filed at 04/13/14 0800  Gross per 24 hour  Intake  666.8 ml  Output    230 ml  Net  436.8 ml    Telemetry:NSR rates in the 70's. Some occasional PVC/s.   Exam:  General: No acute distress.  Lungs: Diminished in the bases. No wheezes.   Cardiac: No elevated JVP or bruits. RRR, distant heart sounds.  no gallop or rub.   Abdomen: Normoactive bowel sounds, nontender, nondistended.JP drain noted from midline abdominal dressing.   Extremities: No pitting edema, distal pulses diminished.    Lab Results:  Basic Metabolic Panel:  Recent Labs Lab 04/10/14 0622 04/11/14 0511 04/11/14 2021 04/12/14 0433 04/13/14 0447  NA  --  143  --  144 143  K  --  3.2* 3.2* 3.6* 4.5  CL  --  108  --  108 109  CO2  --  22  --  23 20  GLUCOSE  --  106*  --  89 117*  BUN  --  23  --  17 12  CREATININE  --  1.39*  --  1.22 0.95  CALCIUM  --  7.7*  --  8.0* 8.5  MG 1.7 1.3*  --   --  1.4*    Liver Function Tests:  Recent Labs Lab 04/09/14 0947 04/11/14 0511 04/13/14 0447  AST 41* 18 21  ALT 26 13 18   ALKPHOS 48 46 66  BILITOT 0.6 0.4 0.5  PROT 5.2* 4.5* 5.3*  ALBUMIN 2.2* 1.9* 2.2*    CBC:  Recent Labs Lab 04/09/14 0427 04/09/14 0947 04/11/14 0511  WBC 9.0  13.4* 8.3  HGB 11.6* 12.6* 11.2*  HCT 35.1* 38.6* 32.6*  MCV 86.0 87.3 82.1  PLT 160 178 157    Cardiac Enzymes:  Recent Labs Lab 04/09/14 1603 04/09/14 2212 04/10/14 0436  TROPONINI 8.00* 11.45* 9.25*    Coagulation:  Recent Labs Lab 04/09/14 0947  INR 1.24    Radiology: CXR: 04/11/2014 Appliances appear in unchanged in position. Normal heart size.  Pulmonary vascularity appears prominent possibly due date AP supine  technique. Infiltration or atelectasis in the lung bases.  Costophrenic angles are not included within the field of view.  IMPRESSION:  Infiltration or atelectasis in the lung bases similar to previous  study.  Echocardiogram: 04/09/2014 Left ventricle: The cavity size was normal. Wall thickness was normal. Systolic function was severely reduced. The estimated ejection fraction was in the range of 20% to 25%. Diffuse severe global hypokinesis. The study is not technically sufficient to allow evaluation of LV diastolic function. - Aortic valve: Mildly calcified annulus. Mildly thickened leaflets. Grossly there is no significant stenosis or regurgitation. Doppler images are insufficient to  accurately measure gradients or velocities. - Right ventricle: Not well visualized. Grossly appears normal in size and function. - Tricuspid valve: Not well visualized. Grossly there is no significant regurgitation or stenosis. - Technically difficult study.    Medications:   Scheduled Medications: . amiodarone  400 mg Oral BID  . antiseptic oral rinse  7 mL Mouth Rinse BID  . citalopram  20 mg Oral Daily  . dutasteride  0.5 mg Oral Daily  . ezetimibe  10 mg Oral Daily  . feeding supplement (ENSURE COMPLETE)  237 mL Oral BID BM  . furosemide  20 mg Oral Daily  . irbesartan  75 mg Oral Daily  . metoprolol tartrate  12.5 mg Oral BID  . pantoprazole  40 mg Oral Q1200  . potassium chloride  10 mEq Oral BID  . risperiDONE  0.5 mg Oral BID  . simvastatin  20 mg  Oral QHS  . tamsulosin  0.4 mg Oral QPC supper    Infusions: . fentaNYL infusion INTRAVENOUS Stopped (04/11/14 0900)    PRN Medications: fentaNYL, fentaNYL, LORazepam, morphine injection, ondansetron (ZOFRAN) IV, zolpidem   Assessment and Plan:   1. S/P V-tach cardiac arrest: 04/09/2014: Multiple etiologies to include severe metabolic acidosis, intra-abdominal infection, sepsis, CAD, systolic dysfunction (EF-25%) requiring intubation. Events of weekend have been reviewed. He has been extubated. Amiodarone gtt is discontinued and now on po 400 mg BID,  metoprolol 12.5 mg BID. BP is stable with no evidence of recurrent V-tach. Continue amiodarone  400 mg dose for 2 weeks, and then decrease to 200 mg BID for 2 weeks, then daily. Patient is actually now DNR, not device candidate after discussion today.  2. S/P MI with Severe Systolic Dysfunction: In the setting of cardiac arrest. Troponin peaked at 11.45, and is now trending downward. Hx of CAD with CABG 1987. He remains on BB. No ACE was started due renal failure. Creatinine is now  .95 this am. Will begin lisinopril 2.5 mg po daily. I will have him follow up in the office in a couple of weeks. Consider adding spironolactone due to hypokalemia. Enteresto (sacubitril/valsartan is also to be considered.   3. CAD: He currently is not a candidate for cardiac cath. Will revisit this on follow up appointment.   4. S/P Exploratory Lap with Cheree Ditto Plication 04/06/2014: Continues recovery with some abdominal tenderness. Follow up per Dr. Lovell Sheehan.    Bettey Mare. [place] NP  04/13/2014, 9:08 AM   Attending note:  Patient seen and examined. Reviewed hospital course, consultation by Dr. Wyline Mood, and events of the weekend. Patient had a VT arrest in the setting of severe comorbid illnesses and cardiomyopathy as detailed above. He had actually been DO NOT RESUSCITATE prior to admission, this was reversed with recent surgery. He is now reinstituted as DO NOT  RESUSCITATE at his request, no plan for aggressive cardiac workup or device therapies. His rhythm has been stable on oral amiodarone, he also continues on beta blocker with recently added low-dose ACE inhibitor. Renal function has improved significantly. We will continue to follow patient, he is  planned for SNF placement per Hospitalist team.  Jonelle Sidle, M.D., F.A.C.C.

## 2014-04-13 NOTE — Clinical Social Work Placement (Signed)
Clinical Social Work Department CLINICAL SOCIAL WORK PLACEMENT NOTE 04/13/2014  Patient:  [name], [name]  Account Number:  [id number] Admit date:  04/06/2014  Clinical Social Worker:  Derenda Fennel, LCSW  Date/time:  04/12/2014 03:04 PM  Clinical Social Work is seeking post-discharge placement for this patient at the following level of care:   SKILLED NURSING   (*CSW will update this form in Epic as items are completed)   04/12/2014  Patient/family provided with [facility] Department of Clinical Social Work's list of facilities offering this level of care within the geographic area requested by the patient (or if unable, by the patient's family).  04/12/2014  Patient/family informed of their freedom to choose among providers that offer the needed level of care, that participate in Medicare, Medicaid or managed care program needed by the patient, have an available bed and are willing to accept the patient.  04/12/2014  Patient/family informed of [facility]' ownership interest in [facility], as well as of the fact that they are under no obligation to receive care at this facility.  PASARR submitted to EDS on 04/12/2014 PASARR number received on 04/12/2014  FL2 transmitted to all facilities in geographic area requested by pt/family on  04/12/2014 FL2 transmitted to all facilities within larger geographic area on   Patient informed that his/her managed care company has contracts with or will negotiate with  certain facilities, including the following:     Patient/family informed of bed offers received:  04/13/2014 Patient chooses bed at [facility] [place] Physician recommends and patient chooses bed at  [facility] [place]  Patient to be transferred to [facility] [place] on  04/13/2014 Patient to be transferred to facility by daughter Patient and family notified of transfer on 04/13/2014 Name of family member notified:  Dot Lanes- daughter  The following physician request  were entered in Epic:   Additional Comments:  Derenda Fennel, LCSW [phone]

## 2014-04-13 NOTE — Discharge Summary (Signed)
Physician Discharge Summary  [name] QJJ:[id number] DOB: 09-May-1933 DOA: 04/06/2014  PCP: Octavio Graves, DO  Admit date: 04/06/2014 Discharge date: 04/13/2014  Time spent: 45 minutes  Recommendations for Outpatient Follow-up:  1. Needs CMET, Magnesium and CBC +  pain on about one week 2. Would recommend amiodarone 400 mg twice a day for about 2 weeks and then cut back to 20 mg twice a day for 2 weeks and then daily amiodarone 200 mg-cardiology to make close appointment for followup. 3. He will need to be placed on aspirin~04/20/14 4. Would recommend close followup with the general surgeon as an outpatient as well 5. Patient on preadmission ARB which has been restarted at a lower dose 6. Propanolol has been changed to metoprolol this admission.   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  Patient had emergent exploratory laparoscopy 04/06/14, placed on broad-spectrum antibiotic for suspected peritonitis, subsequently had  V. tach code blue 04/09/14, intubated for airway protection  [facility] assumed care from Dr. Arnoldo Morale medical issues  Cardiology, pulmonary were consulted  Family refused transport as patient initially was a no code blue  The patient was successfully extubated 9/6 has been stable since   Hospital Course:   1. Vtach Cardiac arrest-2/2 to sepsis-ROSC after 12mn with 2 epinephrine, Cardioversion. Placed on IV amiodarone for prophylaxis from arrhythmia initially, 2 by mouth 400 twice a day 9/7 is tolerating by mouth liquids. Troponin's are elevated likely 2/2 to recent Vtach arrest. Echo 04/10/14 shows global hypokinesis. Repeat magnesium in a.m.-We will get baseline TSH and FT's as might need to be on longer term AMIO per cardio. Metoprolol from IV to PO 12.5 bid 04/12/14.  Cardiology will see the patient in the outpatient and get close followup-consideration for down titration of amiodarone as well as spironolactone and eventual potential cardiac catheterization once more stable.  We will place him on aspirin in about 4-5 days as an OP to allow for good healing of Ulcer and area of surgery 2. Acute respiratory failure 2/2 to #1-ventilated 9/4-9/6, currently on O2 nasal cannula, desats screen ordered for of 3. Non-oliguric AKI [inital Creat on admit 3.29]c Met Acidosis with with AG=17 [2/2 to Peritonitis /sepsis and renal failure] and compensated resp alkalosis and hypernatremia-etiology sepsis as well as free water deficit which subsequently Metabolic abnormalities have completely resolved. Nephrology assistance greatly appreciated during hospitalization and they signed off subsequent resolution 4. Hypokalemia-we will replace orally.  On discharge she was sent home on potassium replacement and this was 4.5-he was sent home on replacement 10 milliequivalents twice a day, magnesium was 1.4  and in keep his above 2.0 therefore discharged on Mag-Ox 400 daily 5. S/p Surgery Perforated Viscus 04/06/14-Per gen surgery.  Patient graduated to regular soft diet on 04/13/14 and JP drain was pulled as it was helping him much. He looked very stable and will followup with gastroenterology and general surgery as an outpatient. He should be on Protonix daily 40 mg 6. Severe malnutrition-Consider TPN. 7. H/o CAD-MI '87, '97-currently stable. Rectal asa added 04/10/14- re0start as OP oral dose in 4-5 days as above 8. HLD-statin on hold-resumed on d/c home 9. Rheumatoid Arthritis on hold   Consultants:  Pulmonology  Cardiology  Nephrology Procedures:  Intubation day 04/10/14  Triple lumen 04/06/14  NG tube 16 French 04/06/14  Foley catheter 04/06/49  Extubated 04/11/14 Antibiotics:  Zosyn 04/06/09-04/12/14  Vancomycin 04/09/14-04/12/14   Discharge Exam: Filed Vitals:   04/13/14 0757  BP:   Pulse:   Temp: 97.6 F (36.4 C)  Resp:     General: Alert pleasant hungry Cardiovascular: s1 s2 no m/r/g Respiratory: clear no added sound  Discharge Instructions You were cared for by a hospitalist during your hospital stay. If you have any questions about your discharge medications or the care you received while you were in the hospital after you are discharged, you can call the unit and asked to speak with the hospitalist on call if the hospitalist that took care of you is not available. Once you are discharged, your primary care physician will handle any further medical issues. Please note that NO REFILLS for any discharge medications will be authorized once you are discharged, as it is imperative that you return to your primary care physician (or establish a relationship with a primary care physician if you do not have one) for your aftercare needs so that they can reassess your need for medications and monitor your lab values.  Discharge Instructions   Diet - low sodium heart healthy    Complete by:  As directed      Discharge instructions    Complete by:  As directed      Increase activity slowly    Complete by:  As directed             Current Discharge Medication List    START taking these medications   Details  amiodarone (PACERONE) 400 MG tablet Take 1 tablet (400 mg total) by mouth 2 (two) times daily. Qty: 60 tablet, Refills: 0    magnesium oxide (MAG-OX) 400 (241.3 MG) MG tablet Take 1 tablet (400 mg total) by mouth daily. Qty: 30 tablet    metoprolol tartrate (LOPRESSOR) 12.5 mg TABS tablet Take 0.5 tablets (12.5 mg total) by mouth 2 (two) times daily.    pantoprazole (PROTONIX) 40 MG tablet Take 1 tablet (40 mg total) by mouth daily at 12 noon.      CONTINUE these medications which have CHANGED   Details  oxyCODONE (ROXICODONE) 15 MG immediate release tablet Take 1 tablet (15 mg total) by mouth every 4 (four) hours as needed for pain. Qty: 30 tablet, Refills: 0    valsartan (DIOVAN) 40 MG tablet Take 1 tablet (40 mg total) by mouth daily. Qty: 30 tablet, Refills: 0      CONTINUE these medications which have NOT CHANGED   Details  AVODART 0.5 MG capsule Take 0.5 mg by mouth daily.     citalopram (CELEXA) 10 MG tablet Take 20 mg by mouth daily.     ezetimibe (ZETIA) 10 MG tablet Take 10 mg by  mouth daily.      furosemide (LASIX) 20 MG tablet Take 1 tablet (20 mg total) by mouth daily. Qty: 30 tablet, Refills: 6    nitroGLYCERIN (NITROSTAT) 0.4 MG SL tablet Place 1 tablet (0.4 mg total) under the tongue every 5 (five) minutes as needed for chest pain. Qty: 25 tablet, Refills: 6    potassium chloride (K-DUR) 10 MEQ tablet Take 10 mEq by mouth 2 (two) times daily.    risperiDONE (RISPERDAL) 0.5 MG tablet Take 0.5 mg by mouth 2 (two) times daily.    simvastatin (ZOCOR) 20 MG tablet Take 20 mg by mouth at bedtime.     tamsulosin (FLOMAX) 0.4 MG CAPS capsule Take 0.4 mg by mouth daily after supper.     Thiamine HCl (VITAMIN B-1) 250 MG tablet Take 250 mg by mouth daily.      zolpidem (AMBIEN) 10 MG tablet Take 10 mg by mouth at bedtime as needed for sleep.      STOP taking these  medications     atenolol (TENORMIN) 25 MG tablet        No Known Allergies    The results of significant diagnostics from this hospitalization (including imaging, microbiology, ancillary and laboratory) are listed below for reference.    Significant Diagnostic Studies: Ct Abdomen Pelvis Wo Contrast  04/06/2014   CLINICAL DATA:  Abdominal pain, vomiting  EXAM: CT ABDOMEN AND PELVIS WITHOUT CONTRAST  TECHNIQUE: Multidetector CT imaging of the abdomen and pelvis was performed following the standard protocol without IV contrast.  COMPARISON:  None.  FINDINGS: Lung bases are unremarkable. Sagittal images of the spine shows levoscoliosis lumbar spine. Extensive degenerative changes lumbar spine.  Oral contrast material was given to the patient. Unenhanced liver shows no biliary ductal dilatation. There is high density perihepatic ascites. High-density ascites is noted in right paracolic gutter. High-density ascites noted left paracolic gutter and within pelvis. This may be due to contrast material, hemorrhagic products or proteinaceous products. There is free air in upper abdomen in left upper quadrant and posterior to the stomach. Findings are consistent with perforated viscus. There is abnormal thickening of duodenal wall in axial image 27. Findings are highly suspicious for perforated duodenum, stomach or colon. There is significant left hydronephrosis and significant left renal cortical thinning probable due to chronic obstructive uropathy. 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 There is a calcification in left upper abdomen measures 9 mm. Extensive atherosclerotic vascular calcifications. No aortic aneurysm. No small bowel obstruction. No colonic obstruction. Some stool noted within rectum. Prostate gland and seminal vesicles are unremarkable. No destructive bony lesions are noted within pelvis. Degenerative changes bilateral hip joints.  IMPRESSION: 1. There is high density ascites consistent with  peritonitis. This may be due to oral contrast, hemorrhagic products or proteinaceous products. 2. There is free air in upper abdomen consistent with perforated viscus. Mild thickening of duodenal wall. Given high density ascites this may be due to duodenal perforation, gastric perforation or upper colonic perforation. 3. No small bowel obstruction. 4. There is probable chronic significant left hydronephrosis probable due to chronic UPJ obstruction. 5. Extensive degenerative changes lumbar spine. Critical Value/emergent results were called by telephone at the time of interpretation on 04/06/2014 at 9:19 am to Dr. Veryl Speak , who verbally acknowledged these results.   Electronically Signed   By: Lahoma Crocker M.D.   On: 04/06/2014 09:20   Dg Chest Port 1 View  04/11/2014   CLINICAL DATA:  Right rib fractures. Cardiac arrest on  Friday. Recent abdominal surgery. Perforation.  EXAM: PORTABLE CHEST - 1 VIEW  COMPARISON:  04/09/2014  FINDINGS: Appliances appear in unchanged in position. Normal heart size. Pulmonary vascularity appears prominent possibly due date AP supine technique. Infiltration or atelectasis in the lung bases. Costophrenic angles are not included within the field of view.  IMPRESSION: Infiltration or atelectasis in the lung bases similar to previous study.   Electronically Signed   By: Lucienne Capers M.D.   On: 04/11/2014 06:54   Dg Chest Port 1 View  04/09/2014   CLINICAL DATA:  Cardiac arrest  EXAM: PORTABLE CHEST - 1 VIEW  COMPARISON:  Chest radiograph 04/06/2014  FINDINGS: Left subclavian central venous catheter tip projects at the expected location of the central left brachiocephalic vein. Enteric tube courses inferior to the diaphragm. The endotracheal tube appears to terminate within the distal trachea. Stable cardiac and mediastinal contours. Interval development of heterogeneous opacities right lung base. Probable skin fold projecting over the right lateral hemi thorax. Suggestion of deep  sulcus on the left. No large consolidative opacities within left hemi thorax.  IMPRESSION: 1. Suggestion of deep sulcus on the left which is favored to be secondary to patient positioning. Additionally lucency around the aortic arch may be artifactual. Recommend attention on repeat evaluation to exclude the possibility of pneumothorax. 2. Endotracheal tube tip terminates 1.8 cm superior to the carina. 3. New heterogeneous opacities right lung base may represent combination of pleural fluid and/or atelectasis. 4. Lucency within the peripheral right lower hemi thorax is favored to represent skin fold. These results will be called to the ordering clinician or representative by the Radiologist Assistant, and communication documented in the PACS or zVision Dashboard.   Electronically Signed   By: Lovey Newcomer M.D.   On: 04/09/2014 09:56   Dg Chest Portable 1 View  04/06/2014   CLINICAL DATA:  Abdominal pain.  EXAM: PORTABLE CHEST - 1 VIEW  COMPARISON:  None.  FINDINGS: Lungs volumes are low with mild atelectasis. No pneumothorax or pleural effusion. Heart size is normal.  IMPRESSION: No acute disease in a low volume chest.   Electronically Signed   By: Inge Rise M.D.   On: 04/06/2014 09:28   Dg Chest Port 1v Same Day  04/09/2014   CLINICAL DATA:  Right rib fractures.  Possible pneumothorax.  EXAM: PORTABLE CHEST - 1 VIEW SAME DAY  COMPARISON:  04/09/2014.  FINDINGS: Support devices are in stable position. Right basilar airspace opacity and probable layering effusion, stable. Left basilar atelectasis or infiltrate also noted. No visible pneumothorax.  IMPRESSION: Bibasilar opacities, right greater than left. Small layering right effusion. No visible pneumothorax.   Electronically Signed   By: Rolm Baptise M.D.   On: 04/09/2014 10:39   Dg Chest Port 1v Same Day  04/06/2014   CLINICAL DATA:  Line placement  EXAM: PORTABLE CHEST - 1 VIEW SAME DAY  COMPARISON:  Chest radiograph 04/06/2014  FINDINGS: NG tube tip  projects inferior to the diaphragm. Interval insertion of left subclavian central venous catheter with tip projecting over the superior vena cava. Stable cardiac and mediastinal contours. Mild elevation right hemidiaphragm with right basilar opacities, favored to represent atelectasis. No pleural effusion or pneumothorax. Postsurgical change proximal right humerus. Free intraperitoneal air.  IMPRESSION: Left subclavian central venous catheter tip projects over the superior vena cava. No pneumothorax.  Free intraperitoneal air, concerning for perforated hollow viscus. This was reported on prior CT 04/06/2014.   Electronically Signed   By: Dian Situ  Rosana Hoes M.D.   On: 04/06/2014 13:25    Microbiology: Recent Results (from the past 240 hour(s))  MRSA PCR SCREENING     Status: None   Collection Time    04/06/14  2:13 PM      Result Value Ref Range Status   MRSA by PCR NEGATIVE  NEGATIVE Final   Comment:            The GeneXpert MRSA Assay (FDA     approved for NASAL specimens     only), is one component of a     comprehensive MRSA colonization     surveillance program. It is not     intended to diagnose MRSA     infection nor to guide or     monitor treatment for     MRSA infections.  CLOSTRIDIUM DIFFICILE BY PCR     Status: None   Collection Time    04/12/14  8:30 AM      Result Value Ref Range Status   C difficile by pcr NEGATIVE  NEGATIVE Final     Labs: Basic Metabolic Panel:  Recent Labs Lab 04/07/14 0506 04/08/14 0409 04/09/14 0427 04/09/14 0947 04/10/14 0436 04/10/14 0622 04/11/14 0511 04/11/14 2021 04/12/14 0433 04/13/14 0447  NA 142 146 149* 150* 145  --  143  --  144 143  K 5.2 4.1 3.6* 3.3* 2.7*  --  3.2* 3.2* 3.6* 4.5  CL 115* 117* 118* 118* 109  --  108  --  108 109  CO2 13* 15* 13* 11* 19  --  22  --  23 20  GLUCOSE 82 77 68* 85 136*  --  106*  --  89 117*  BUN 33* 37* 31* 30* 28*  --  23  --  17 12  CREATININE 3.31* 2.66* 1.89* 1.80* 1.63*  --  1.39*  --  1.22  0.95  CALCIUM 7.7* 8.3* 8.6 8.7 8.3*  --  7.7*  --  8.0* 8.5  MG 1.7 1.6 1.7  --   --  1.7 1.3*  --   --  1.4*  PHOS 4.7* 4.2 3.3  --   --   --  1.7*  --   --   --    Liver Function Tests:  Recent Labs Lab 04/09/14 0947 04/11/14 0511 04/13/14 0447  AST 41* 18 21  ALT _0 ALKPHOS 48 46 66  BILITOT 0.6 0.4 0.5  PROT 5.2* 4.5* 5.3*  ALBUMIN 2.2* 1.9* 2.2*   No results found for this basename: LIPASE, AMYLASE,  in the last 168 hours No results found for this basename: AMMONIA,  in the last 168 hours CBC:  Recent Labs Lab 04/07/14 2104 04/08/14 0409 04/09/14 0427 04/09/14 0947 04/11/14 0511  WBC 8.1 7.5 9.0 13.4* 8.3  NEUTROABS  --   --   --  10.4* 6.9  HGB 11.9* 11.3* 11.6* 12.6* 11.2*  HCT 35.6* 34.5* 35.1* 38.6* 32.6*  MCV 85.4 85.6 86.0 87.3 82.1  PLT 147* 144* 160 178 157   Cardiac Enzymes:  Recent Labs Lab 04/09/14 0947 04/09/14 1220 04/09/14 1603 04/09/14 2212 04/10/14 0436  TROPONINI 2.81* 4.48* 8.00* 11.45* 9.25*   BNP: BNP (last 3 results) No results found for this basename: PROBNP,  in the last 8760 hours CBG: No results found for this basename: GLUCAP,  in the last 168 hours     Signed:  Nita Sells  [facility] 04/13/2014, 8:19 AM

## 2014-04-13 NOTE — Progress Notes (Addendum)
Discharge instructions reviewed w/ pt and his daughter. Pt alert and oriented. Happy to be leaving.abdominal drsg dry and intact. Pt voiding in urinal.pain is now controled. Pt transferred via w/c. Transfer report called to [facility].

## 2014-04-13 NOTE — Clinical Social Work Note (Signed)
CSW spoke with pt's daughter Angelica Chessman who reports concerns about pt's home. She states that she believes DSS was referred and then case was closed. Angelica Chessman said his home is dirty and not in good condition. CSW advised her to call DSS again is she was concerned. Addressed this with pt and he denies any concerns. No beds available at [facility] or [facility]. Pt states he would agree to [place] also agrees to this. Facility can offer bed today. [facility] L[facility] will take pt this afternoon. D/C summary faxed.   Derenda Fennel, [place] [phone]

## 2014-04-13 NOTE — Progress Notes (Signed)
He says at home he has no respiratory symptoms. Since he's off the ventilator and doing very well I will plan to sign off at this time. For allowing me to see him with you

## 2014-04-13 NOTE — Progress Notes (Signed)
7 Days Post-Op  Subjective: Feels fine. No, pain. Tolerating full liquid diet well.  Objective: Vital signs in last 24 hours: Temp:  [97.5 F (36.4 C)-97.8 F (36.6 C)] 97.6 F (36.4 C) (09/08 0757) Pulse Rate:  [31-129] 65 (09/08 0734) Resp:  [16-30] 21 (09/08 0734) BP: (125-157)/(57-114) 144/70 mmHg (09/08 0730) SpO2:  [80 %-100 %] 100 % (09/08 0734) Weight:  [56.1 kg (123 lb 10.9 oz)] 56.1 kg (123 lb 10.9 oz) (09/08 0500) Last BM Date: 04/12/14  Intake/Output from previous day: 09/07 0701 - 09/08 0700 In: 460.2 [P.O.:360; I.V.:100.2] Out: 280 [Drains:80; Stool:200] Intake/Output this shift:    General appearance: alert, cooperative and no distress GI: soft, non-tender; bowel sounds normal; no masses,  no organomegaly and Incision healing well. JP drainage serous in nature. It was removed.  Lab Results:   Recent Labs  04/11/14 0511  WBC 8.3  HGB 11.2*  HCT 32.6*  PLT 157   BMET  Recent Labs  04/12/14 0433 04/13/14 0447  NA 144 143  K 3.6* 4.5  CL 108 109  CO2 23 20  GLUCOSE 89 117*  BUN 17 12  CREATININE 1.22 0.95  CALCIUM 8.0* 8.5   PT/INR No results found for this basename: LABPROT, INR,  in the last 72 hours  Studies/Results: No results found.  Anti-infectives: Anti-infectives   Start     Dose/Rate Route Frequency Ordered Stop   04/11/14 1200  vancomycin (VANCOCIN) IVPB 750 mg/150 ml premix  Status:  Discontinued     750 mg 150 mL/hr over 60 Minutes Intravenous Every 24 hours 04/11/14 0946 04/12/14 0834   04/10/14 1230  vancomycin (VANCOCIN) 500 mg in sodium chloride 0.9 % 100 mL IVPB  Status:  Discontinued     500 mg 100 mL/hr over 60 Minutes Intravenous Every 24 hours 04/09/14 1217 04/11/14 0946   04/09/14 1230  vancomycin (VANCOCIN) IVPB 750 mg/150 ml premix     750 mg 150 mL/hr over 60 Minutes Intravenous  Once 04/09/14 1217 04/09/14 1408   04/09/14 1000  piperacillin-tazobactam (ZOSYN) IVPB 3.375 g  Status:  Discontinued     3.375  g 12.5 mL/hr over 240 Minutes Intravenous Every 8 hours 04/09/14 0831 04/11/14 1952   04/06/14 1800  piperacillin-tazobactam (ZOSYN) IVPB 2.25 g  Status:  Discontinued     2.25 g 100 mL/hr over 30 Minutes Intravenous Every 8 hours 04/06/14 1458 04/09/14 0831   04/06/14 1600  piperacillin-tazobactam (ZOSYN) IVPB 3.375 g  Status:  Discontinued     3.375 g 12.5 mL/hr over 240 Minutes Intravenous Every 8 hours 04/06/14 1438 04/06/14 1453   04/06/14 0930  piperacillin-tazobactam (ZOSYN) IVPB 3.375 g     3.375 g 100 mL/hr over 30 Minutes Intravenous  Once 04/06/14 0923 04/06/14 1036      Assessment/Plan: s/p Procedure(s): EXPLORATORY LAPAROTOMY, GRAHAM PLICATION INSERTION CENTRAL LINE ADULT  impression: Has made a remarkable recovery from his MI. He has done well from a surgery standpoint. Okay for discharge to skilled nursing. Would remove abdominal staples in 1 week. No need for further antibiotics from my standpoint. Does need to be on protonix bid for three months.  No NSAIDS.  LOS: 7 days    Gizella [place] A 04/13/2014

## 2014-04-14 LAB — TSH: TSH: 2.48 u[IU]/mL (ref 0.350–4.500)

## 2014-05-06 DIAGNOSIS — I469 Cardiac arrest, cause unspecified: Secondary | ICD-10-CM

## 2014-05-06 HISTORY — DX: Cardiac arrest, cause unspecified: I46.9

## 2014-05-14 ENCOUNTER — Encounter: Payer: Self-pay | Admitting: Cardiology

## 2014-05-14 ENCOUNTER — Ambulatory Visit (INDEPENDENT_AMBULATORY_CARE_PROVIDER_SITE_OTHER): Payer: Medicare Other | Admitting: Cardiology

## 2014-05-14 VITALS — BP 132/52 | HR 66 | Ht 66.0 in | Wt 102.0 lb

## 2014-05-14 DIAGNOSIS — E782 Mixed hyperlipidemia: Secondary | ICD-10-CM

## 2014-05-14 DIAGNOSIS — I255 Ischemic cardiomyopathy: Secondary | ICD-10-CM

## 2014-05-14 NOTE — Assessment & Plan Note (Signed)
On Zocor 

## 2014-05-14 NOTE — Patient Instructions (Addendum)
Your physician recommends that you schedule a follow-up appointment in:  3 months    Your physician recommends that you continue on your current medications as directed. Please refer to the Current Medication list given to you today.     Thank you for choosing [facility] !   

## 2014-05-14 NOTE — Progress Notes (Signed)
Clinical Summary [name] is a 80 y.o.male last seen by Ms. Lawrence NP in May. Records reviewed, recent hospitalization in September noted. He presented at that time with abdominal pain and hypotension, abdominal CT scan showed evidence of an acute abdomen and he underwent emergent exploratory laparotomy with findings of anterior perforation of the first portion of the duodenum associated with peritonitis. He did have a VT arrest in this setting, was successfully converted him temporarily on the ventilator. He had previously been DNR, however this was reversed for his surgery. Following extubation and medical therapy stabilization, he was made DNR again at his request. Cardiology followed him during hospital stay, no addditional testing was planned however after discussion with him.  Echocardiogram performed in September showed LVEF 20-25% with diffuse severe hypokinesis.  He currently resides at the [facility]. He is functionally limited, in a wheelchair today, does not do any major degree of activity. Reports fair appetite at best. He has continued to lose weight. Does not endorse chest pain or palpitations, no syncope.    No Known Allergies  Current Outpatient Prescriptions  Medication Sig Dispense Refill  . amiodarone (PACERONE) 400 MG tablet Take 400 mg by mouth daily.      . AVODART 0.5 MG capsule Take 0.5 mg by mouth daily.       . citalopram (CELEXA) 10 MG tablet Take 2 tablets (20 mg total) by mouth daily.  60 tablet  0  . ezetimibe (ZETIA) 10 MG tablet Take 10 mg by mouth daily.        . furosemide (LASIX) 20 MG tablet Take 1 tablet (20 mg total) by mouth daily.  30 tablet  6  . magnesium oxide (MAG-OX) 400 (241.3 MG) MG tablet Take 1 tablet (400 mg total) by mouth daily.  30 tablet    . metoprolol tartrate (LOPRESSOR) 12.5 mg TABS tablet Take 0.5 tablets (12.5 mg total) by mouth 2 (two) times daily.      . nitroGLYCERIN (NITROSTAT) 0.4 MG SL tablet Place 1 tablet  (0.4 mg total) under the tongue every 5 (five) minutes as needed for chest pain.  25 tablet  6  . oxyCODONE (ROXICODONE) 15 MG immediate release tablet Take 1 tablet (15 mg total) by mouth every 4 (four) hours as needed for pain.  30 tablet  0  . pantoprazole (PROTONIX) 40 MG tablet Take 1 tablet (40 mg total) by mouth daily at 12 noon.      . potassium chloride (K-DUR) 10 MEQ tablet Take 10 mEq by mouth 2 (two) times daily.      . risperiDONE (RISPERDAL) 0.5 MG tablet Take 1 tablet (0.5 mg total) by mouth 2 (two) times daily.  60 tablet  0  . simvastatin (ZOCOR) 20 MG tablet Take 20 mg by mouth at bedtime.       . tamsulosin (FLOMAX) 0.4 MG CAPS capsule Take 0.4 mg by mouth daily after supper.       . Thiamine HCl (VITAMIN B-1) 250 MG tablet Take 250 mg by mouth daily.        . valsartan (DIOVAN) 40 MG tablet Take 1 tablet (40 mg total) by mouth daily.  30 tablet  0  . zolpidem (AMBIEN) 10 MG tablet Take 10 mg by mouth at bedtime as needed for sleep.       No current facility-administered medications for this visit.    Past Medical History  Diagnosis Date  . Coronary atherosclerosis of native coronary artery  Multivessel  . Essential hypertension, benign   . Mixed hyperlipidemia   . Erectile dysfunction   . Osteoarthritis   . Myocardial infarction     IMI 1987 and PLMI 1997  . Rheumatoid arthritis, adult     Past Surgical History  Procedure Laterality Date  . Coronary artery bypass graft  1987  . Carpal tunnel release    . Rotator cuff repair    . Neck surgery    . Esophagogastroduodenoscopy N/A 01/23/2013    Procedure: ESOPHAGOGASTRODUODENOSCOPY (EGD);  Surgeon: Shirley Friar, MD;  Location[facility] ENDOSCOPY;  Service: Endoscopy;  Laterality: N/A;  . Esophagogastroduodenoscopy N/A 01/23/2013    Procedure: ESOPHAGOGASTRODUODENOSCOPY (EGD);  Surgeon: Shirley Friar, MD;  Location: [facility] ENDOSCOPY;  Service: Endoscopy;  Laterality: N/A;  . Laparotomy N/A 04/06/2014     Procedure: EXPLORATORY LAPAROTOMY, [place];  Surgeon: Dalia Heading, MD;  Location: [facility] ORS;  Service: General;  Laterality: N/A;  . Central venous catheter insertion Left 04/06/2014    Procedure: INSERTION CENTRAL LINE ADULT;  Surgeon: Dalia Heading, MD;  Location: [facility] ORS;  Service: General;  Laterality: Left;     Social History [name] reports that he quit smoking about 2 years ago. His smoking use included Cigarettes. He has a 15 pack-year smoking history. He has never used smokeless tobacco. [name] reports that he does not drink alcohol.  Review of Systems Other systems negative except as outlined.  Physical Examination Filed Vitals:   05/14/14 1318  BP: 132/52  Pulse: 66   Filed Weights   05/14/14 1318  Weight: 102 lb (46.267 kg)    Chronically ill appearing male, muscle wasting.  HEENT: Conjuctivae normal, oropharynx with poor dentition.  Neck: No bruits, no thyromegaly.  Lungs: Decreased breath sound without wheezes.  Cardiac: Regular rate and rhythm with soft basal systolic murmur.  Abdomen: Soft, NABS.  Skin: Warm and dry.  Extremities: Arthritic deformities, no pitting edema.  Neuropsychiatric: Alert and oriented x 3, affect appropriate.   Problem List and Plan   Cardiomyopathy, ischemic Symptomatically stable at present with limited functional capacity. Overall poor prognosis, LVEF 20-25%. He had a VT arrest in the setting of acute illness with perforated viscus and peritonitis as noted above. Currently on amiodarone. He is DO NOT RESUSCITATE, does not want to pursue any further invasive evaluations. Currently in the [facility]. Continue medical therapy.  Mixed hyperlipidemia On Zocor.    Jonelle Sidle, M.D., F.A.C.C.

## 2014-05-14 NOTE — Assessment & Plan Note (Signed)
Symptomatically stable at present with limited functional capacity. Overall poor prognosis, LVEF 20-25%. He had a VT arrest in the setting of acute illness with perforated viscus and peritonitis as noted above. Currently on amiodarone. He is DO NOT RESUSCITATE, does not want to pursue any further invasive evaluations. Currently in the [facility]. Continue medical therapy.

## 2014-06-02 ENCOUNTER — Ambulatory Visit: Payer: Medicare Other | Admitting: Cardiology

## 2014-06-25 IMAGING — CR DG CHEST 2V
2 series · 2 of 2 positions shown · non-contrast
Comparison: 07/14/2011

CLINICAL DATA: Mental status change

CHEST - 2 VIEW

[view not recorded (1 of 2)]
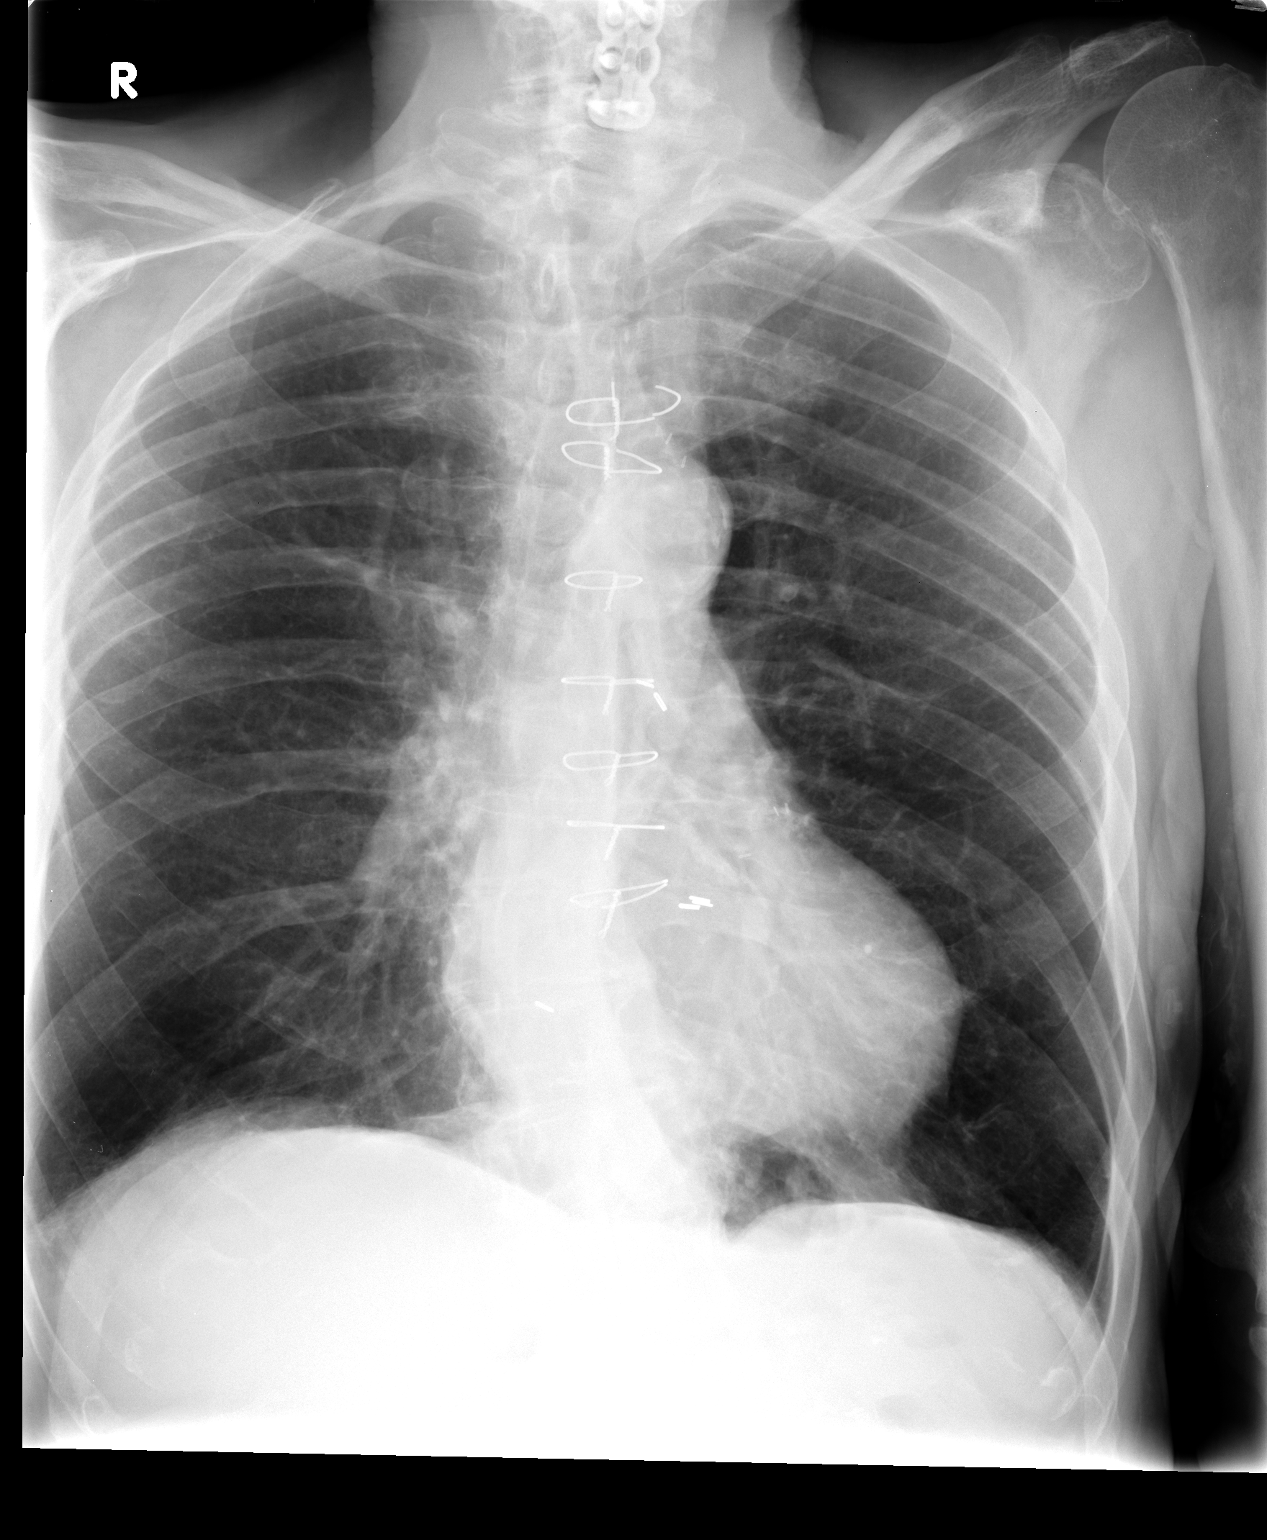

[view not recorded (2 of 2)]
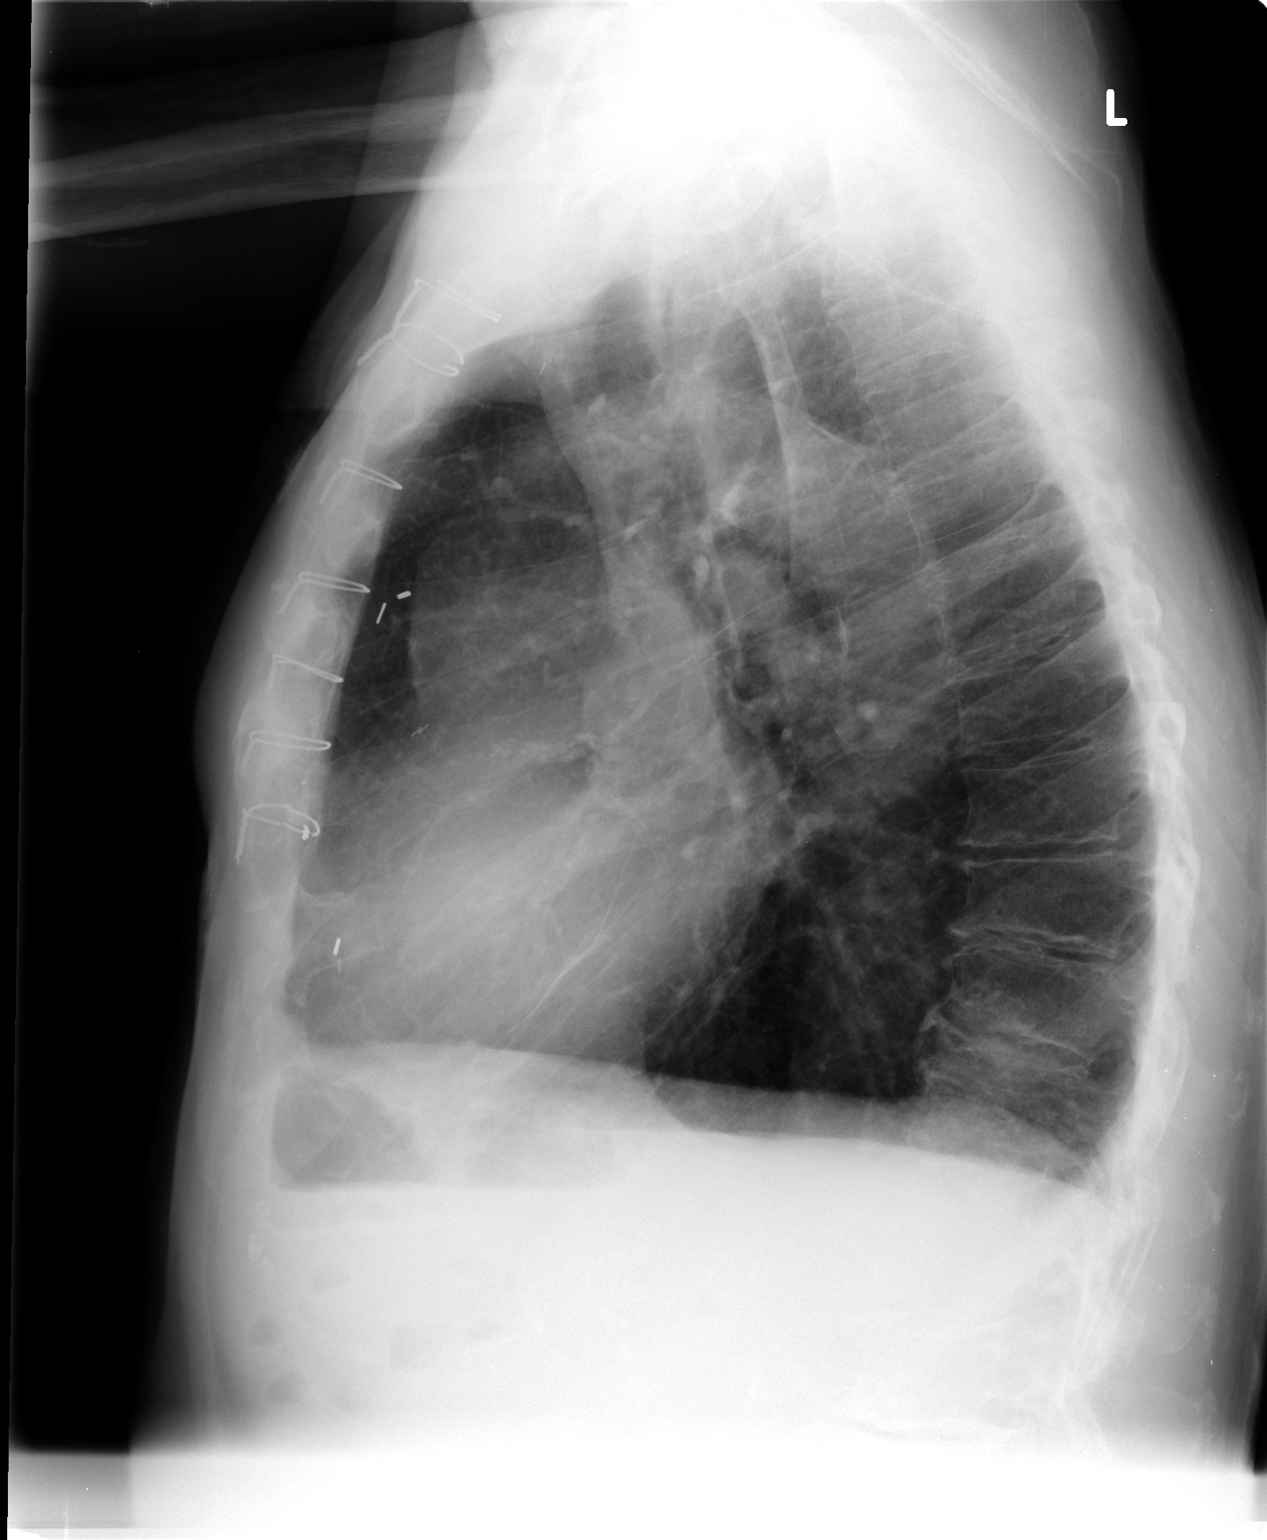

[2 of 2 positions shown; findings below may reference images not displayed]

FINDINGS: Previous median sternotomy and CABG procedure.

No pleural effusion or edema.

No airspace consolidation identified.

There are coarsened interstitial markings identified bilaterally
consistent with COPD.
IMPRESSION: 1.  No acute cardiopulmonary abnormalities.

## 2014-06-25 IMAGING — CT CT HEAD W/O CM
1 series · 16 of 30 positions shown, 20 images · non-contrast
Comparison: 01/21/2008

CLINICAL DATA: Altered mental status

CT HEAD WITHOUT CONTRAST
TECHNIQUE: Contiguous axial images were obtained from the base of
the skull through the vertex without contrast.

[Series 2: headseq 4.8 h37s · axial · 0.43mm/px · z∈[+124,+259]mm · 16 of 30 slices shown, 20 images]
[im 2/30  brain]
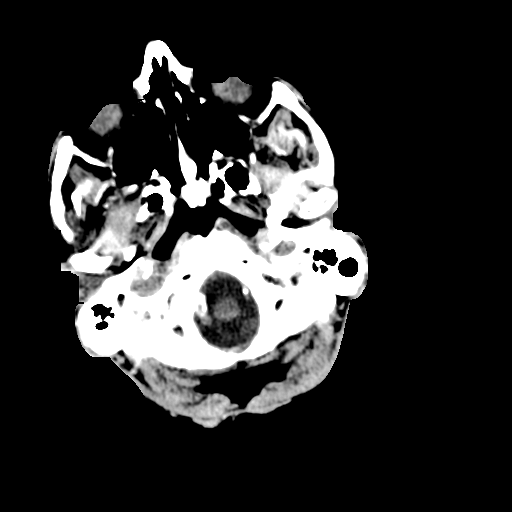
[im 2/30  bone]
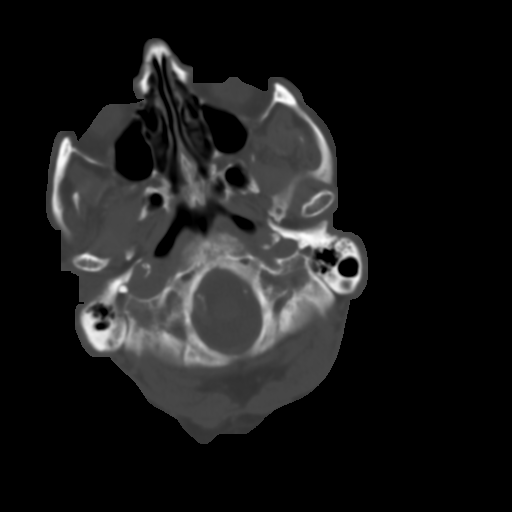
[im 4/30  brain]
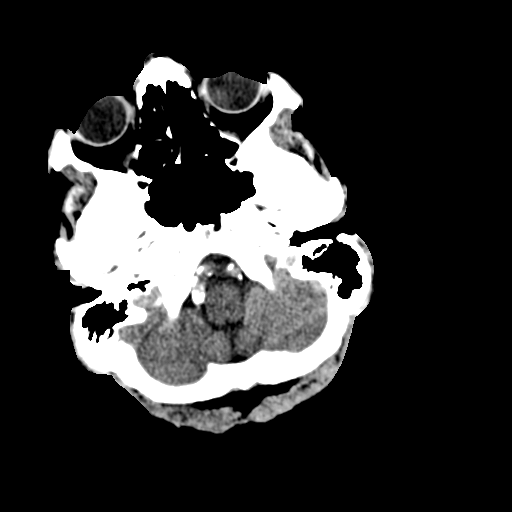
[im 6/30  brain]
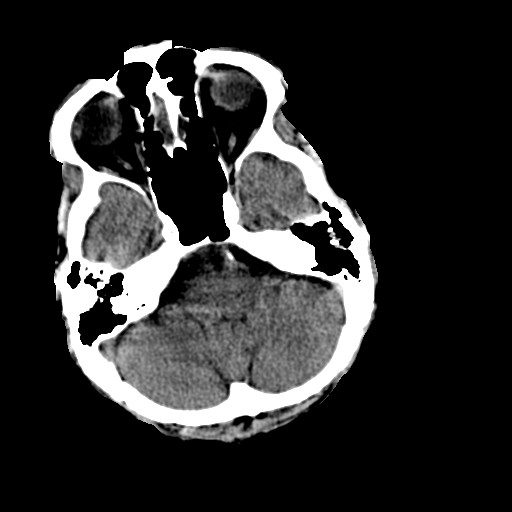
[im 8/30  brain]
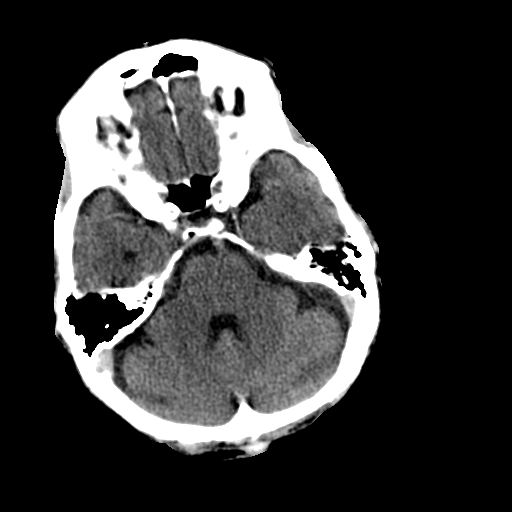
[im 9/30  brain]
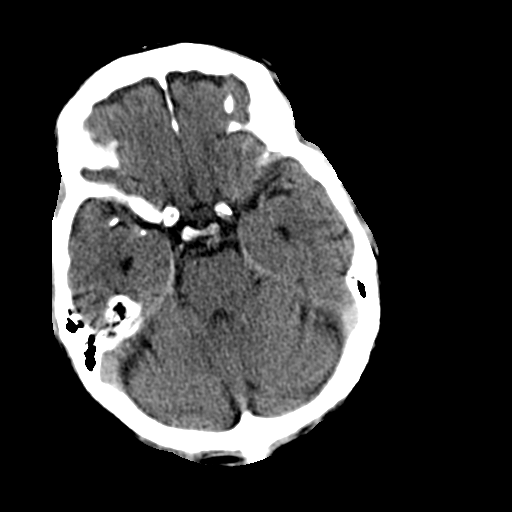
[im 9/30  bone]
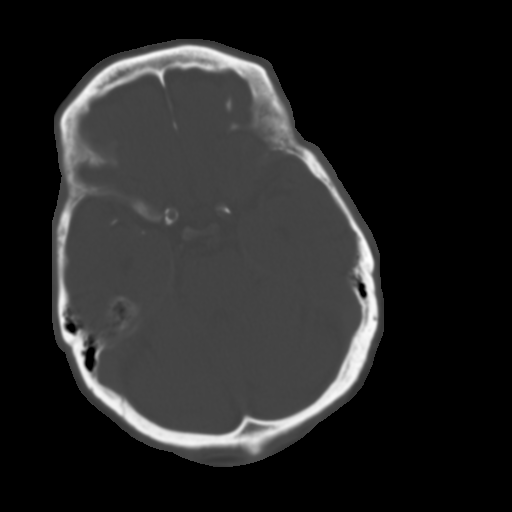
[im 11/30  brain]
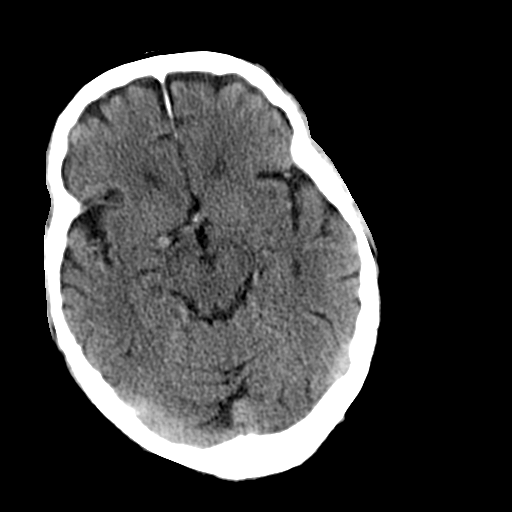
[im 13/30  brain]
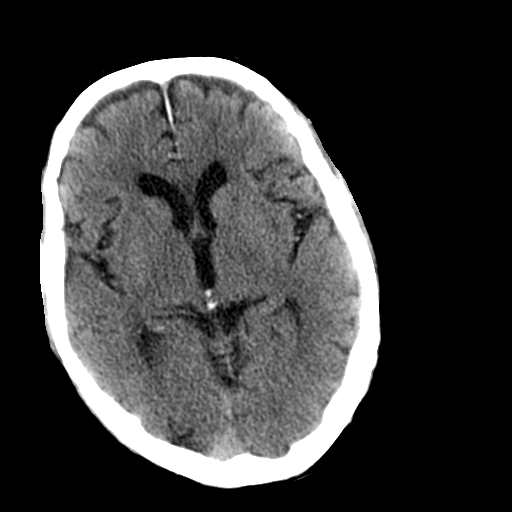
[im 15/30  brain]
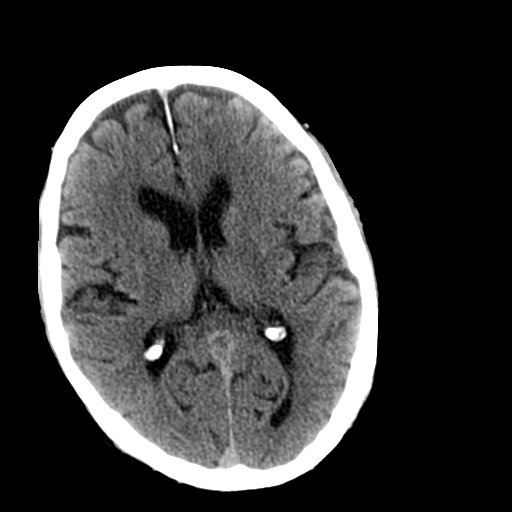
[im 16/30  brain]
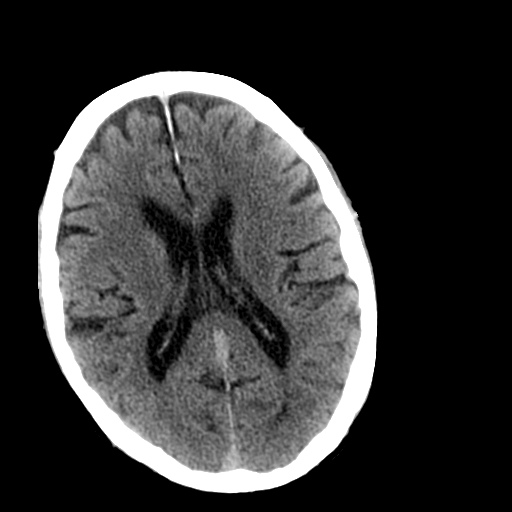
[im 16/30  bone]
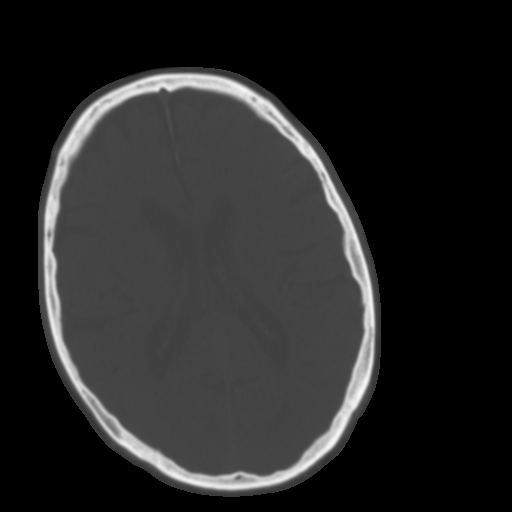
[im 18/30  brain]
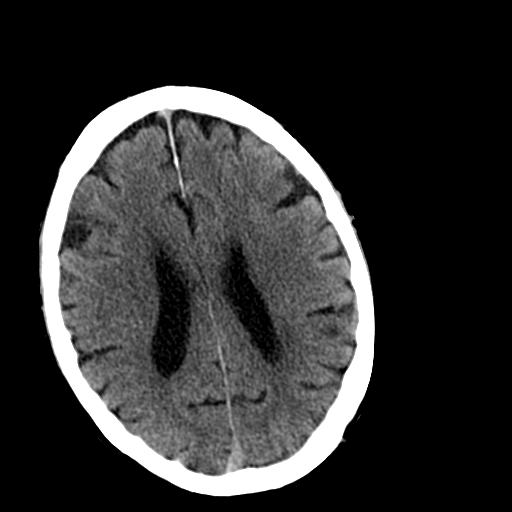
[im 20/30  brain]
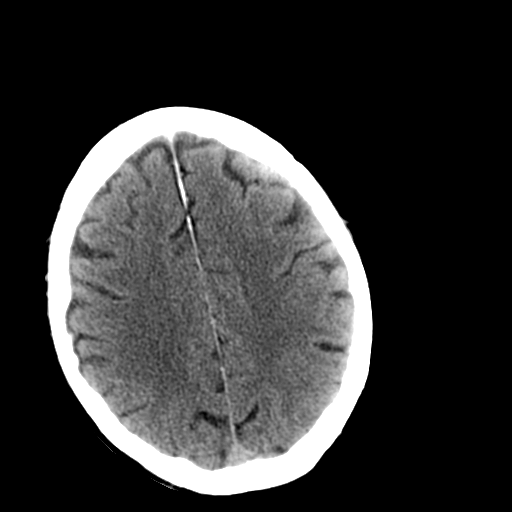
[im 22/30  brain]
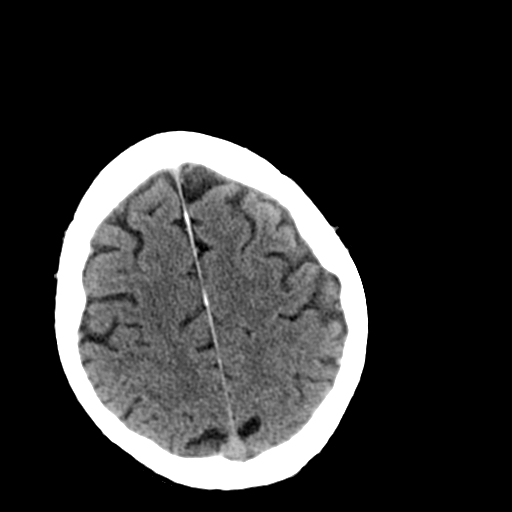
[im 23/30  brain]
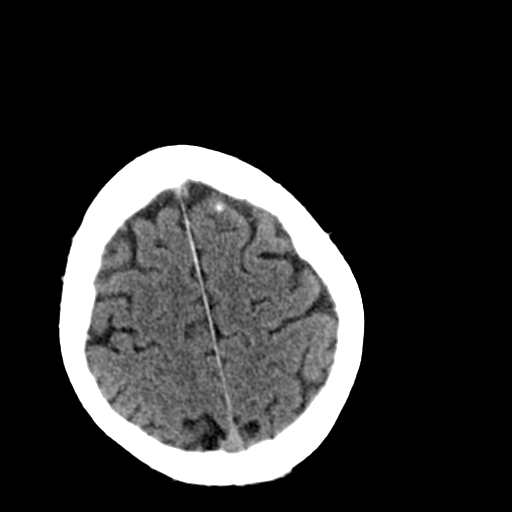
[im 23/30  bone]
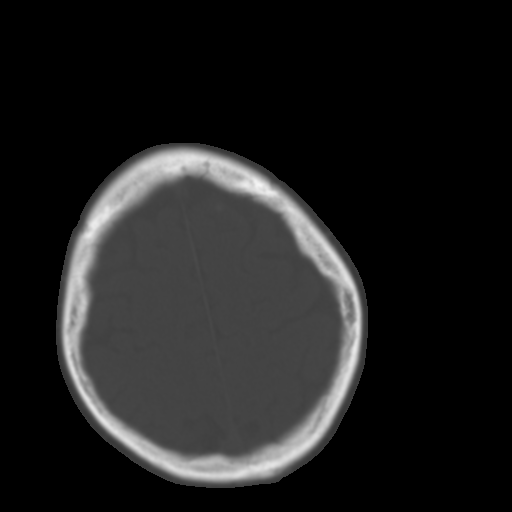
[im 25/30  brain]
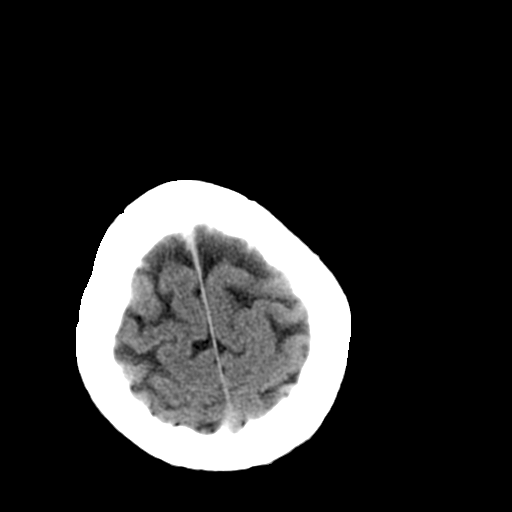
[im 27/30  brain]
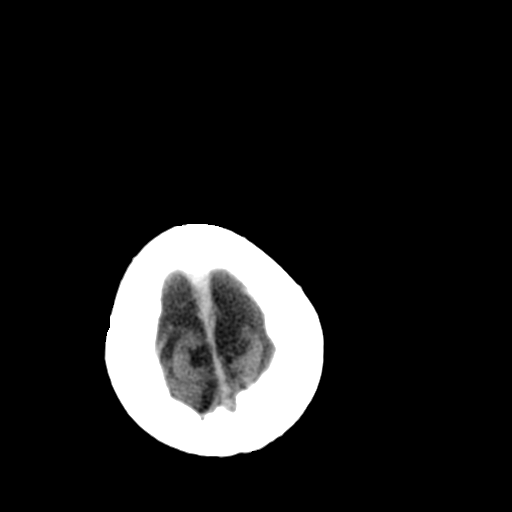
[im 29/30  brain]
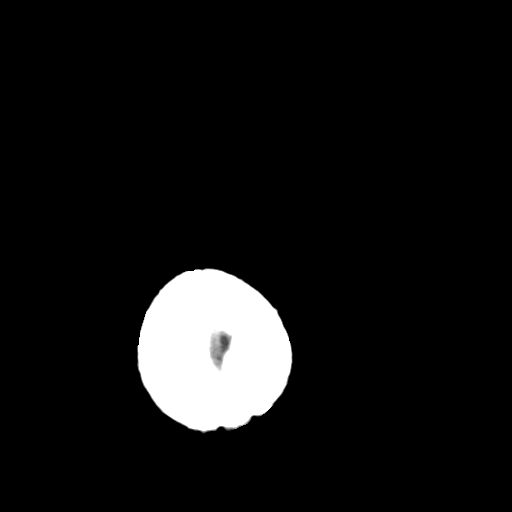

[16 of 30 positions shown; findings below may reference images not displayed]

FINDINGS: There is diffuse patchy low density throughout the
subcortical and periventricular white matter consistent with
chronic small vessel ischemic change.

There is prominence of the sulci and ventricles consistent with
brain atrophy.

There is no evidence for acute brain infarct, hemorrhage or mass.

The paranasal sinuses and mastoid air cells are clear.  The skull
is intact.
IMPRESSION: 1.  Small vessel ischemic disease and brain atrophy.
2.  No acute intracranial abnormalities.

## 2014-08-19 ENCOUNTER — Encounter: Payer: Self-pay | Admitting: Gastroenterology

## 2014-09-03 ENCOUNTER — Emergency Department (HOSPITAL_COMMUNITY): Payer: Medicare Other

## 2014-09-03 ENCOUNTER — Inpatient Hospital Stay (HOSPITAL_COMMUNITY)
Admission: EM | Admit: 2014-09-03 | Discharge: 2014-09-07 | DRG: 682 | Disposition: A | Discharge status: Skilled Nursing Facility | Payer: Medicare Other | Attending: Internal Medicine | Admitting: Internal Medicine

## 2014-09-03 ENCOUNTER — Encounter (HOSPITAL_COMMUNITY): Payer: Self-pay | Admitting: *Deleted

## 2014-09-03 DIAGNOSIS — E782 Mixed hyperlipidemia: Secondary | ICD-10-CM | POA: Diagnosis present

## 2014-09-03 DIAGNOSIS — R571 Hypovolemic shock: Secondary | ICD-10-CM | POA: Diagnosis present

## 2014-09-03 DIAGNOSIS — Z681 Body mass index (BMI) 19 or less, adult: Secondary | ICD-10-CM

## 2014-09-03 DIAGNOSIS — I10 Essential (primary) hypertension: Secondary | ICD-10-CM | POA: Diagnosis present

## 2014-09-03 DIAGNOSIS — N1330 Unspecified hydronephrosis: Secondary | ICD-10-CM | POA: Diagnosis present

## 2014-09-03 DIAGNOSIS — D72829 Elevated white blood cell count, unspecified: Secondary | ICD-10-CM

## 2014-09-03 DIAGNOSIS — D649 Anemia, unspecified: Secondary | ICD-10-CM

## 2014-09-03 DIAGNOSIS — M6282 Rhabdomyolysis: Secondary | ICD-10-CM | POA: Diagnosis present

## 2014-09-03 DIAGNOSIS — E872 Acidosis, unspecified: Secondary | ICD-10-CM

## 2014-09-03 DIAGNOSIS — T370X5A Adverse effect of sulfonamides, initial encounter: Secondary | ICD-10-CM | POA: Diagnosis present

## 2014-09-03 DIAGNOSIS — Z833 Family history of diabetes mellitus: Secondary | ICD-10-CM

## 2014-09-03 DIAGNOSIS — Z8249 Family history of ischemic heart disease and other diseases of the circulatory system: Secondary | ICD-10-CM | POA: Diagnosis not present

## 2014-09-03 DIAGNOSIS — I255 Ischemic cardiomyopathy: Secondary | ICD-10-CM | POA: Diagnosis present

## 2014-09-03 DIAGNOSIS — R740 Nonspecific elevation of levels of transaminase and lactic acid dehydrogenase [LDH]: Secondary | ICD-10-CM

## 2014-09-03 DIAGNOSIS — Z87891 Personal history of nicotine dependence: Secondary | ICD-10-CM | POA: Diagnosis not present

## 2014-09-03 DIAGNOSIS — I959 Hypotension, unspecified: Secondary | ICD-10-CM | POA: Diagnosis present

## 2014-09-03 DIAGNOSIS — S91302A Unspecified open wound, left foot, initial encounter: Secondary | ICD-10-CM

## 2014-09-03 DIAGNOSIS — E875 Hyperkalemia: Secondary | ICD-10-CM

## 2014-09-03 DIAGNOSIS — M1990 Unspecified osteoarthritis, unspecified site: Secondary | ICD-10-CM | POA: Diagnosis present

## 2014-09-03 DIAGNOSIS — I25810 Atherosclerosis of coronary artery bypass graft(s) without angina pectoris: Secondary | ICD-10-CM | POA: Diagnosis present

## 2014-09-03 DIAGNOSIS — Z951 Presence of aortocoronary bypass graft: Secondary | ICD-10-CM

## 2014-09-03 DIAGNOSIS — R7401 Elevation of levels of liver transaminase levels: Secondary | ICD-10-CM

## 2014-09-03 DIAGNOSIS — L97429 Non-pressure chronic ulcer of left heel and midfoot with unspecified severity: Secondary | ICD-10-CM | POA: Diagnosis present

## 2014-09-03 DIAGNOSIS — N179 Acute kidney failure, unspecified: Principal | ICD-10-CM | POA: Diagnosis present

## 2014-09-03 DIAGNOSIS — E43 Unspecified severe protein-calorie malnutrition: Secondary | ICD-10-CM

## 2014-09-03 DIAGNOSIS — M069 Rheumatoid arthritis, unspecified: Secondary | ICD-10-CM | POA: Diagnosis present

## 2014-09-03 DIAGNOSIS — Z66 Do not resuscitate: Secondary | ICD-10-CM | POA: Diagnosis present

## 2014-09-03 DIAGNOSIS — I252 Old myocardial infarction: Secondary | ICD-10-CM | POA: Diagnosis not present

## 2014-09-03 HISTORY — DX: Do not resuscitate: Z66

## 2014-09-03 HISTORY — DX: Cardiac arrest, cause unspecified: I46.9

## 2014-09-03 HISTORY — DX: Ischemic cardiomyopathy: I25.5

## 2014-09-03 LAB — CBC WITH DIFFERENTIAL/PLATELET
Basophils Absolute: 0 10*3/uL (ref 0.0–0.1)
Basophils Relative: 0 % (ref 0–1)
EOS ABS: 0 10*3/uL (ref 0.0–0.7)
Eosinophils Relative: 0 % (ref 0–5)
HEMATOCRIT: 34.1 % — AB (ref 39.0–52.0)
Hemoglobin: 10.5 g/dL — ABNORMAL LOW (ref 13.0–17.0)
LYMPHS PCT: 5 % — AB (ref 12–46)
Lymphs Abs: 0.7 10*3/uL (ref 0.7–4.0)
MCH: 24.9 pg — ABNORMAL LOW (ref 26.0–34.0)
MCHC: 30.8 g/dL (ref 30.0–36.0)
MCV: 81 fL (ref 78.0–100.0)
MONOS PCT: 6 % (ref 3–12)
Monocytes Absolute: 0.8 10*3/uL (ref 0.1–1.0)
NEUTROS ABS: 13.1 10*3/uL — AB (ref 1.7–7.7)
NEUTROS PCT: 89 % — AB (ref 43–77)
PLATELETS: 344 10*3/uL (ref 150–400)
RBC: 4.21 MIL/uL — ABNORMAL LOW (ref 4.22–5.81)
RDW: 17.7 % — ABNORMAL HIGH (ref 11.5–15.5)
WBC: 14.7 10*3/uL — ABNORMAL HIGH (ref 4.0–10.5)

## 2014-09-03 LAB — I-STAT CHEM 8, ED
BUN: 67 mg/dL — ABNORMAL HIGH (ref 6–23)
CALCIUM ION: 1.14 mmol/L (ref 1.13–1.30)
CHLORIDE: 107 mmol/L (ref 96–112)
CREATININE: 3.4 mg/dL — AB (ref 0.50–1.35)
GLUCOSE: 121 mg/dL — AB (ref 70–99)
HCT: 34 % — ABNORMAL LOW (ref 39.0–52.0)
HEMOGLOBIN: 11.6 g/dL — AB (ref 13.0–17.0)
POTASSIUM: 5.2 mmol/L — AB (ref 3.5–5.1)
SODIUM: 138 mmol/L (ref 135–145)
TCO2: 18 mmol/L (ref 0–100)

## 2014-09-03 LAB — URINALYSIS, ROUTINE W REFLEX MICROSCOPIC
BILIRUBIN URINE: NEGATIVE
GLUCOSE, UA: NEGATIVE mg/dL
Hgb urine dipstick: NEGATIVE
Ketones, ur: NEGATIVE mg/dL
LEUKOCYTES UA: NEGATIVE
Nitrite: NEGATIVE
Protein, ur: NEGATIVE mg/dL
Specific Gravity, Urine: >1.03 — ABNORMAL HIGH (ref 1.005–1.030)
Urobilinogen, UA: 0.2 mg/dL (ref 0.0–1.0)
pH: 5.5 (ref 5.0–8.0)

## 2014-09-03 LAB — I-STAT TROPONIN, ED: Troponin i, poc: 0.03 ng/mL (ref 0.00–0.08)

## 2014-09-03 LAB — I-STAT CG4 LACTIC ACID, ED: LACTIC ACID, VENOUS: 1.06 mmol/L (ref 0.5–2.0)

## 2014-09-03 MED ORDER — RISPERIDONE 0.5 MG PO TABS
0.5000 mg | ORAL_TABLET | Freq: Every day | ORAL | Status: DC
  Administered 2014-09-04 – 2014-09-06 (×3): 0.5 mg via ORAL
  Filled 2014-09-03 (×3): qty 1

## 2014-09-03 MED ORDER — SODIUM CHLORIDE 0.9 % IJ SOLN
3.0000 mL | Freq: Two times a day (BID) | INTRAMUSCULAR | Status: DC
Start: 2014-09-03 — End: 2014-09-07
  Administered 2014-09-04 – 2014-09-07 (×8): 3 mL via INTRAVENOUS

## 2014-09-03 MED ORDER — MAGNESIUM OXIDE 400 (241.3 MG) MG PO TABS
400.0000 mg | ORAL_TABLET | Freq: Every day | ORAL | Status: DC
  Administered 2014-09-04 – 2014-09-07 (×4): 400 mg via ORAL
  Filled 2014-09-03 (×4): qty 1

## 2014-09-03 MED ORDER — PANTOPRAZOLE SODIUM 40 MG PO TBEC
40.0000 mg | DELAYED_RELEASE_TABLET | Freq: Two times a day (BID) | ORAL | Status: DC
  Administered 2014-09-04 – 2014-09-07 (×7): 40 mg via ORAL
  Filled 2014-09-03 (×7): qty 1

## 2014-09-03 MED ORDER — NITROGLYCERIN 0.4 MG SL SUBL: 0.4000 mg | SUBLINGUAL_TABLET | SUBLINGUAL | Status: DC | PRN

## 2014-09-03 MED ORDER — VITAMIN B-1 100 MG PO TABS
250.0000 mg | ORAL_TABLET | Freq: Every day | ORAL | Status: DC
  Administered 2014-09-04 – 2014-09-07 (×4): 250 mg via ORAL
  Filled 2014-09-03 (×4): qty 3

## 2014-09-03 MED ORDER — ACETAMINOPHEN 650 MG RE SUPP
650.0000 mg | Freq: Four times a day (QID) | RECTAL | Status: DC | PRN
Start: 2014-09-03 — End: 2014-09-07

## 2014-09-03 MED ORDER — SODIUM CHLORIDE 0.9 % IV SOLN
INTRAVENOUS | Status: DC
  Administered 2014-09-03: 19:00:00 via INTRAVENOUS
  Administered 2014-09-04: 1000 mL via INTRAVENOUS

## 2014-09-03 MED ORDER — SIMVASTATIN 20 MG PO TABS
20.0000 mg | ORAL_TABLET | Freq: Every day | ORAL | Status: DC
  Administered 2014-09-04 – 2014-09-06 (×3): 20 mg via ORAL
  Filled 2014-09-03 (×3): qty 1

## 2014-09-03 MED ORDER — ENOXAPARIN SODIUM 30 MG/0.3ML ~~LOC~~ SOLN
30.0000 mg | SUBCUTANEOUS | Status: DC
  Administered 2014-09-04 – 2014-09-06 (×3): 30 mg via SUBCUTANEOUS
  Filled 2014-09-03 (×3): qty 0.3

## 2014-09-03 MED ORDER — ACETAMINOPHEN 325 MG PO TABS
650.0000 mg | ORAL_TABLET | Freq: Four times a day (QID) | ORAL | Status: DC | PRN
  Administered 2014-09-04 – 2014-09-07 (×3): 650 mg via ORAL
  Filled 2014-09-03 (×3): qty 2

## 2014-09-03 MED ORDER — EZETIMIBE 10 MG PO TABS
10.0000 mg | ORAL_TABLET | Freq: Every day | ORAL | Status: DC
  Administered 2014-09-04 – 2014-09-07 (×4): 10 mg via ORAL
  Filled 2014-09-03 (×4): qty 1

## 2014-09-03 MED ORDER — DUTASTERIDE 0.5 MG PO CAPS
0.5000 mg | ORAL_CAPSULE | Freq: Every day | ORAL | Status: DC
  Administered 2014-09-04 – 2014-09-07 (×4): 0.5 mg via ORAL
  Filled 2014-09-03 (×5): qty 1

## 2014-09-03 MED ORDER — SODIUM CHLORIDE 0.9 % IV BOLUS (SEPSIS)
500.0000 mL | Freq: Once | INTRAVENOUS | Status: AC
  Administered 2014-09-03: 500 mL via INTRAVENOUS

## 2014-09-03 MED ORDER — VITAMIN D 1000 UNITS PO TABS
ORAL_TABLET | Freq: Every day | ORAL | Status: DC
  Administered 2014-09-04 – 2014-09-07 (×4): 1000 [IU] via ORAL
  Filled 2014-09-03 (×4): qty 1

## 2014-09-03 MED ORDER — BOOST / RESOURCE BREEZE PO LIQD
1.0000 | Freq: Three times a day (TID) | ORAL | Status: DC
  Administered 2014-09-04 – 2014-09-07 (×8): 1 via ORAL

## 2014-09-03 MED ORDER — AMIODARONE HCL 200 MG PO TABS
200.0000 mg | ORAL_TABLET | Freq: Every day | ORAL | Status: DC
  Administered 2014-09-04 – 2014-09-07 (×4): 200 mg via ORAL
  Filled 2014-09-03 (×4): qty 1

## 2014-09-03 MED ORDER — RESOURCE 2.0 PO LIQD: Freq: Three times a day (TID) | ORAL | Status: DC

## 2014-09-03 MED ORDER — FERROUS SULFATE 325 (65 FE) MG PO TABS
325.0000 mg | ORAL_TABLET | Freq: Two times a day (BID) | ORAL | Status: DC
  Administered 2014-09-04 – 2014-09-07 (×7): 325 mg via ORAL
  Filled 2014-09-03 (×7): qty 1

## 2014-09-03 MED ORDER — CITALOPRAM HYDROBROMIDE 20 MG PO TABS
20.0000 mg | ORAL_TABLET | Freq: Every day | ORAL | Status: DC
  Administered 2014-09-04 – 2014-09-07 (×4): 20 mg via ORAL
  Filled 2014-09-03 (×4): qty 1

## 2014-09-03 MED ORDER — SODIUM CHLORIDE 0.9 % IV BOLUS (SEPSIS): 500.0000 mL | Freq: Once | INTRAVENOUS | Status: DC

## 2014-09-03 MED ORDER — PRO-STAT SUGAR FREE PO LIQD
30.0000 mL | Freq: Two times a day (BID) | ORAL | Status: DC
  Administered 2014-09-04 – 2014-09-07 (×6): 30 mL via ORAL
  Filled 2014-09-03 (×7): qty 30

## 2014-09-03 MED ORDER — SODIUM CHLORIDE 0.9 % IV SOLN: INTRAVENOUS | Status: DC

## 2014-09-03 MED ORDER — SENNA 8.6 MG PO TABS
1.0000 | ORAL_TABLET | Freq: Every day | ORAL | Status: DC
  Administered 2014-09-04 – 2014-09-06 (×3): 8.6 mg via ORAL
  Filled 2014-09-03 (×3): qty 1

## 2014-09-03 MED ORDER — IPRATROPIUM-ALBUTEROL 0.5-2.5 (3) MG/3ML IN SOLN
3.0000 mL | Freq: Three times a day (TID) | RESPIRATORY_TRACT | Status: DC | PRN
Start: 2014-09-03 — End: 2014-09-07

## 2014-09-03 NOTE — H&P (Signed)
[name] is an 81 y.o. male.   Pearson Grippe (pcp)   Chief Complaint: ARF HPI: 81 yo male with hypertension, hyperlipidemia, CAD was being treated for mrsa of the right heel with bactrim, and found to be in ARF and sent to ED for evaluation.  Pt denies cp, palp , sob, lower ext edema.  Pt will be admitted for ARF, ? Secondary to bactrim.    Past Medical History  Diagnosis Date  . Coronary atherosclerosis of native coronary artery     Multivessel  . Essential hypertension, benign   . Mixed hyperlipidemia   . Erectile dysfunction   . Osteoarthritis   . Myocardial infarction     IMI 1987 and PLMI 1997  . Rheumatoid arthritis, adult   . DNR (do not resuscitate)   . Cardiac arrest 05/2014  . Ischemic cardiomyopathy     Past Surgical History  Procedure Laterality Date  . Coronary artery bypass graft  1987  . Carpal tunnel release    . Rotator cuff repair    . Neck surgery    . Esophagogastroduodenoscopy N/A 01/23/2013    Procedure: ESOPHAGOGASTRODUODENOSCOPY (EGD);  Surgeon: Shirley Friar, MD;  Location: [facility] ENDOSCOPY;  Service: Endoscopy;  Laterality: N/A;  . Esophagogastroduodenoscopy N/A 01/23/2013    Procedure: ESOPHAGOGASTRODUODENOSCOPY (EGD);  Surgeon: Shirley Friar, MD;  Location: [facility] ENDOSCOPY;  Service: Endoscopy;  Laterality: N/A;  . Laparotomy N/A 04/06/2014    Procedure: EXPLORATORY LAPAROTOMY, GRAHAM PLICATION;  Surgeon: Dalia Heading, MD;  Location: [facility] ORS;  Service: General;  Laterality: N/A;  . Central venous catheter insertion Left 04/06/2014    Procedure: INSERTION CENTRAL LINE ADULT;  Surgeon: Dalia Heading, MD;  Location: [facility] ORS;  Service: General;  Laterality: Left;    Family History  Problem Relation Age of Onset  . Coronary artery disease Father     Died age 79 with MI  . Diabetes type II Sister    Social History:  reports that he quit smoking about 3 years ago. His smoking use included Cigarettes. He has a 15 pack-year smoking history. He has  never used smokeless tobacco. He reports that he does not drink alcohol or use illicit drugs.  Allergies: No Known Allergies   (Not in a hospital admission)  Results for orders placed or performed during the hospital encounter of 09/03/14 (from the past 48 hour(s))  CBC with Differential     Status: Abnormal   Collection Time: 09/03/14  5:00 PM  Result Value Ref Range   WBC 14.7 (H) 4.0 - 10.5 K/uL   RBC 4.21 (L) 4.22 - 5.81 MIL/uL   Hemoglobin 10.5 (L) 13.0 - 17.0 g/dL   HCT 36.5 (L) 61.7 - 11.8 %   MCV 81.0 78.0 - 100.0 fL   MCH 24.9 (L) 26.0 - 34.0 pg   MCHC 30.8 30.0 - 36.0 g/dL   RDW 03.0 (H) 35.6 - 34.2 %   Platelets 344 150 - 400 K/uL   Neutrophils Relative % 89 (H) 43 - 77 %   Neutro Abs 13.1 (H) 1.7 - 7.7 K/uL   Lymphocytes Relative 5 (L) 12 - 46 %   Lymphs Abs 0.7 0.7 - 4.0 K/uL   Monocytes Relative 6 3 - 12 %   Monocytes Absolute 0.8 0.1 - 1.0 K/uL   Eosinophils Relative 0 0 - 5 %   Eosinophils Absolute 0.0 0.0 - 0.7 K/uL   Basophils Relative 0 0 - 1 %   Basophils Absolute 0.0  0.0 - 0.1 K/uL  I-Stat CG4 Lactic Acid, ED     Status: None   Collection Time: 09/03/14  5:04 PM  Result Value Ref Range   Lactic Acid, Venous 1.06 0.5 - 2.0 mmol/L  I-stat troponin, ED     Status: None   Collection Time: 09/03/14  5:11 PM  Result Value Ref Range   Troponin i, poc 0.03 0.00 - 0.08 ng/mL   Comment 3            Comment: Due to the release kinetics of cTnI, a negative result within the first hours of the onset of symptoms does not rule out myocardial infarction with certainty. If myocardial infarction is still suspected, repeat the test at appropriate intervals.   I-stat Chem 8, ED     Status: Abnormal   Collection Time: 09/03/14  5:14 PM  Result Value Ref Range   Sodium 138 135 - 145 mmol/L   Potassium 5.2 (H) 3.5 - 5.1 mmol/L   Chloride 107 96 - 112 mmol/L   BUN 67 (H) 6 - 23 mg/dL   Creatinine, Ser 3.40 (H) 0.50 - 1.35 mg/dL   Glucose, Bld 121 (H) 70 - 99 mg/dL    Calcium, Ion 1.14 1.13 - 1.30 mmol/L   TCO2 18 0 - 100 mmol/L   Hemoglobin 11.6 (L) 13.0 - 17.0 g/dL   HCT 34.0 (L) 39.0 - 52.0 %  Urinalysis, Routine w reflex microscopic     Status: Abnormal   Collection Time: 09/03/14  6:29 PM  Result Value Ref Range   Color, Urine YELLOW YELLOW   APPearance CLEAR CLEAR   Specific Gravity, Urine >1.030 (H) 1.005 - 1.030   pH 5.5 5.0 - 8.0   Glucose, UA NEGATIVE NEGATIVE mg/dL   Hgb urine dipstick NEGATIVE NEGATIVE   Bilirubin Urine NEGATIVE NEGATIVE   Ketones, ur NEGATIVE NEGATIVE mg/dL   Protein, ur NEGATIVE NEGATIVE mg/dL   Urobilinogen, UA 0.2 0.0 - 1.0 mg/dL   Nitrite NEGATIVE NEGATIVE   Leukocytes, UA NEGATIVE NEGATIVE    Comment: MICROSCOPIC NOT DONE ON URINES WITH NEGATIVE PROTEIN, BLOOD, LEUKOCYTES, NITRITE, OR GLUCOSE <1000 mg/dL.   Dg Chest Port 1 View  09/03/2014   CLINICAL DATA:  81 year old male with renal failure and left heel infection.  EXAM: PORTABLE CHEST - 1 VIEW  COMPARISON:  Chest x-ray 04/11/2014.  FINDINGS: Lung volumes are low. Study is limited by patient's marked rotation to the left which distorts normal anatomic structures. With these limitations in mind, there are some bibasilar opacities favored to reflect subsegmental atelectasis. No definite consolidative airspace disease. No pleural effusions. No evidence of pulmonary edema. Heart size appears upper limits of normal. The patient is rotated to the left on today's exam, resulting in distortion of the mediastinal contours and reduced diagnostic sensitivity and specificity for mediastinal pathology. Atherosclerosis in the thoracic aorta. Status post median sternotomy.  IMPRESSION: 1. Low lung volumes with probable bibasilar subsegmental atelectasis. 2. Atherosclerosis.   Electronically Signed   By: Vinnie Langton M.D.   On: 09/03/2014 17:56   Dg Foot 2 Views Left  09/03/2014   CLINICAL DATA:  Infection left heel, pt sent from nursing home, unable to elaborate on pain or  symptoms, nurse tech from home did not have any history information. Pt in fetal position, held for films.  EXAM: LEFT FOOT - 2 VIEW  COMPARISON:  None.  FINDINGS: No fracture dislocation.  No bone resorption is seen to indicate osteomyelitis. No soft tissue air.  Osteoarthritic changes are noted most evident at the first metatarsophalangeal joint.  There are vascular calcifications along the dorsalis pedis and posterior tibial arteries.  Bones are demineralized.  IMPRESSION: 1. No fracture or acute bony abnormality. No evidence of osteomyelitis.   Electronically Signed   By: Lajean Manes M.D.   On: 09/03/2014 17:55    Review of Systems  Constitutional: Negative for fever, chills, weight loss, malaise/fatigue and diaphoresis.  HENT: Negative for congestion, ear discharge, ear pain, hearing loss, nosebleeds, sore throat and tinnitus.   Eyes: Negative for blurred vision and double vision.       Presbyopia  Respiratory: Negative for cough, hemoptysis, sputum production, shortness of breath, wheezing and stridor.   Cardiovascular: Negative for chest pain, palpitations, orthopnea, claudication, leg swelling and PND.  Gastrointestinal: Negative for heartburn, nausea, vomiting, abdominal pain, diarrhea, constipation, blood in stool and melena.  Genitourinary: Negative for dysuria, urgency, frequency, hematuria and flank pain.  Musculoskeletal: Negative for myalgias, back pain, joint pain, falls and neck pain.  Skin: Negative for itching and rash.  Neurological: Negative for dizziness, weakness and headaches.  Endo/Heme/Allergies: Negative for environmental allergies and polydipsia. Does not bruise/bleed easily.  Psychiatric/Behavioral: Negative for depression, suicidal ideas and substance abuse.    Blood pressure 84/49, pulse 57, temperature 98.6 F (37 C), temperature source Rectal, resp. rate 16, height $RemoveBe'5\' 7"'dhawdmMwm$  (1.702 m), weight 38.556 kg (85 lb), SpO2 93 %. Physical Exam  Constitutional: He is  oriented to person, place, and time. He appears well-developed and well-nourished.  HENT:  Head: Normocephalic and atraumatic.  Eyes: Conjunctivae and EOM are normal. Pupils are equal, round, and reactive to light.  Neck: Normal range of motion. Neck supple. No JVD present. No tracheal deviation present. No thyromegaly present.  Cardiovascular: Normal rate and regular rhythm.  Exam reveals no gallop and no friction rub.   No murmur heard. Respiratory: Effort normal and breath sounds normal. No respiratory distress. He has no wheezes. He has no rales. He exhibits no tenderness.  GI: Soft. Bowel sounds are normal. He exhibits no distension. There is no tenderness. There is no rebound and no guarding.  Musculoskeletal: Normal range of motion. He exhibits no edema or tenderness.  Lymphadenopathy:    He has no cervical adenopathy.  Neurological: He is alert and oriented to person, place, and time. He has normal reflexes. He displays normal reflexes. No cranial nerve deficit. He exhibits normal muscle tone. Coordination normal.  Skin: Skin is warm and dry.  1.5cm oval scab over the right heel  Psychiatric: He has a normal mood and affect. His behavior is normal. Judgment and thought content normal.     Assessment/Plan ARF Check urine sodium urine creatinine, urine eosinophils Check renal ultrasound Stop diovan, hold lasix Hydrate very gently with normal saline Bactrim may be responsible for ARF, and hyperkalemia Stop Bactrim  Hyperkalemia Hold diovan Check cmp in am  Anemia Check cbc in am Check iron studies, b12, folate , tsh, esr, spep, upep  Hypotension Check trop, hydrate gently with normal saline, Check cortisol level  CM (EF 20-25%) Strict i and o, and check daily weight.   Jani Gravel 09/03/2014, 8:17 PM

## 2014-09-03 NOTE — ED Notes (Signed)
Foley catheter was placed prior to start of 1900 hrs shift, but not documented.

## 2014-09-03 NOTE — ED Notes (Signed)
Contacted ED for report. No answer, will call back in 5 minutes.

## 2014-09-03 NOTE — ED Provider Notes (Signed)
CSN: [id number]     Arrival date & time 09/03/14  1526 History   First MD Initiated Contact with Patient 09/03/14 1645     Chief Complaint  Patient presents with  . Wound Infection    left foot      The history is provided by the nursing home. The history is limited by the condition of the patient.   Pt was seen at 1700. Per NH report: NH sent pt to the ED for evaluation for left heel wound and "renal failure." Pt is DNR.    Past Medical History  Diagnosis Date  . Coronary atherosclerosis of native coronary artery     Multivessel  . Essential hypertension, benign   . Mixed hyperlipidemia   . Erectile dysfunction   . Osteoarthritis   . Myocardial infarction     IMI 1987 and PLMI 1997  . Rheumatoid arthritis, adult   . DNR (do not resuscitate)    Past Surgical History  Procedure Laterality Date  . Coronary artery bypass graft  1987  . Carpal tunnel release    . Rotator cuff repair    . Neck surgery    . Esophagogastroduodenoscopy N/A 01/23/2013    Procedure: ESOPHAGOGASTRODUODENOSCOPY (EGD);  Surgeon: Shirley Friar, MD;  Location: [facility] ENDOSCOPY;  Service: Endoscopy;  Laterality: N/A;  . Esophagogastroduodenoscopy N/A 01/23/2013    Procedure: ESOPHAGOGASTRODUODENOSCOPY (EGD);  Surgeon: Shirley Friar, MD;  Location: [facility] ENDOSCOPY;  Service: Endoscopy;  Laterality: N/A;  . Laparotomy N/A 04/06/2014    Procedure: EXPLORATORY LAPAROTOMY, GRAHAM PLICATION;  Surgeon: Dalia Heading, MD;  Location: [facility] ORS;  Service: General;  Laterality: N/A;  . Central venous catheter insertion Left 04/06/2014    Procedure: INSERTION CENTRAL LINE ADULT;  Surgeon: Dalia Heading, MD;  Location: [facility] ORS;  Service: General;  Laterality: Left;   Family History  Problem Relation Age of Onset  . Coronary artery disease Father     Died age 9 with MI  . Diabetes type II Sister    History  Substance Use Topics  . Smoking status: Former Smoker -- 0.25 packs/day for 60 years    Types: Cigarettes     Quit date: 07/14/2011  . Smokeless tobacco: Never Used     Comment: stopped 05-29-13  . Alcohol Use: No     Comment: one beeer every 6 months    Review of Systems  Unable to perform ROS: Mental status change      Allergies  Review of patient's allergies indicates no known allergies.  Home Medications   Prior to Admission medications   Medication Sig Start Date End Date Taking? Authorizing Provider  amiodarone (PACERONE) 400 MG tablet Take 400 mg by mouth daily. 04/13/14   Rhetta Mura, MD  AVODART 0.5 MG capsule Take 0.5 mg by mouth daily.  06/19/13   Historical Provider, MD  citalopram (CELEXA) 10 MG tablet Take 2 tablets (20 mg total) by mouth daily. 04/13/14   Rhetta Mura, MD  ezetimibe (ZETIA) 10 MG tablet Take 10 mg by mouth daily.      Historical Provider, MD  furosemide (LASIX) 20 MG tablet Take 1 tablet (20 mg total) by mouth daily. 01/05/13   Jonelle Sidle, MD  magnesium oxide (MAG-OX) 400 (241.3 MG) MG tablet Take 1 tablet (400 mg total) by mouth daily. 04/13/14   Rhetta Mura, MD  metoprolol tartrate (LOPRESSOR) 12.5 mg TABS tablet Take 0.5 tablets (12.5 mg total) by mouth 2 (two) times daily. 04/13/14  Rhetta Mura, MD  nitroGLYCERIN (NITROSTAT) 0.4 MG SL tablet Place 1 tablet (0.4 mg total) under the tongue every 5 (five) minutes as needed for chest pain. 06/29/13   Jonelle Sidle, MD  oxyCODONE (ROXICODONE) 15 MG immediate release tablet Take 1 tablet (15 mg total) by mouth every 4 (four) hours as needed for pain. 04/13/14   Rhetta Mura, MD  pantoprazole (PROTONIX) 40 MG tablet Take 1 tablet (40 mg total) by mouth daily at 12 noon. 04/13/14   Rhetta Mura, MD  potassium chloride (K-DUR) 10 MEQ tablet Take 10 mEq by mouth 2 (two) times daily.    Historical Provider, MD  risperiDONE (RISPERDAL) 0.5 MG tablet Take 1 tablet (0.5 mg total) by mouth 2 (two) times daily. 04/13/14   Rhetta Mura, MD  simvastatin (ZOCOR) 20 MG tablet  Take 20 mg by mouth at bedtime.     Historical Provider, MD  tamsulosin (FLOMAX) 0.4 MG CAPS capsule Take 0.4 mg by mouth daily after supper.  06/19/13   Historical Provider, MD  Thiamine HCl (VITAMIN B-1) 250 MG tablet Take 250 mg by mouth daily.      Historical Provider, MD  valsartan (DIOVAN) 40 MG tablet Take 1 tablet (40 mg total) by mouth daily. 04/13/14   Rhetta Mura, MD  zolpidem (AMBIEN) 10 MG tablet Take 10 mg by mouth at bedtime as needed for sleep.    Historical Provider, MD   BP 69/35 mmHg  Pulse 57  Temp(Src) 99 F (37.2 C) (Rectal)  Resp 15  Ht 5\' 7"  (1.702 m)  Wt 85 lb (38.556 kg)  BMI 13.31 kg/m2  SpO2 89%  Filed Vitals:   09/03/14 1623 09/03/14 1645 09/03/14 1645 09/03/14 1841  BP: 91/35 69/35  91/38  Pulse: 59 57  56  Temp: 99 F (37.2 C)  99 F (37.2 C) 98.6 F (37 C)  TempSrc: Oral  Rectal Rectal  Resp: 16 15  17   Height:      Weight:      SpO2: 99% 89%  92%    Physical Exam  1705; Physical examination:  Nursing notes reviewed; Vital signs and O2 SAT reviewed;  Constitutional: Thin, frail. In no acute distress; Head:  Normocephalic, atraumatic; Eyes: EOMI, PERRL, No scleral icterus; ENMT: Mouth and pharynx normal, Mucous membranes dry.; Neck: Supple, Full range of motion, No lymphadenopathy; Cardiovascular: Regular rate and rhythm, No gallop; Respiratory: Breath sounds clear & equal bilaterally, No wheezes. Normal respiratory effort/excursion; Chest: Nontender, Movement normal; Abdomen: Soft, Nontender, Nondistended, Normal bowel sounds; Genitourinary: No CVA tenderness; Extremities: Pulses normal, +left heel wound with surrounding erythema and overlying eschar/scab, no drainage, no fluctuance, no soft tissue crepitus. No tenderness, No edema, No calf edema or asymmetry.; Neuro: Lethargic, opens his eyes to name, moans. Major CN grossly intact. No facial droop. Moves extremities spontaneously on stretcher.; Skin: Color normal, Warm, Dry.    ED Course   Procedures     EKG Interpretation   Date/Time:  Friday September 03 2014 16:47:25 EST Ventricular Rate:  57 PR Interval:  224 QRS Duration: 163 QT Interval:  541 QTC Calculation: 527 R Axis:   -107 Text Interpretation:  Sinus or ectopic atrial rhythm Prolonged PR interval  Nonspecific IVCD with LAD Inferior infarct, old Artifact Baseline wander  When compared with ECG of 04/09/2014 No significant change was found  Confirmed by [facility]  MD, Nicholos Johns [id number]) on 09/03/2014 5:58:21 PM      MDM  MDM Reviewed: previous chart, nursing note and vitals  Reviewed previous: labs and ECG Interpretation: labs, ECG and x-ray Total time providing critical care: 30-74 minutes. This excludes time spent performing separately reportable procedures and services. Consults: admitting MD   CRITICAL CARE Performed by: Laray Anger Total critical care time: 35 Critical care time was exclusive of separately billable procedures and treating other patients. Critical care was necessary to treat or prevent imminent or life-threatening deterioration. Critical care was time spent personally by me on the following activities: development of treatment plan with patient and/or surrogate as well as nursing, discussions with consultants, evaluation of patient's response to treatment, examination of patient, obtaining history from patient or surrogate, ordering and performing treatments and interventions, ordering and review of laboratory studies, ordering and review of radiographic studies, pulse oximetry and re-evaluation of patient's condition.   Results for orders placed or performed during the hospital encounter of 09/03/14  CBC with Differential  Result Value Ref Range   WBC 14.7 (H) 4.0 - 10.5 K/uL   RBC 4.21 (L) 4.22 - 5.81 MIL/uL   Hemoglobin 10.5 (L) 13.0 - 17.0 g/dL   HCT 75.4 (L) 49.2 - 01.0 %   MCV 81.0 78.0 - 100.0 fL   MCH 24.9 (L) 26.0 - 34.0 pg   MCHC 30.8 30.0 - 36.0 g/dL   RDW 07.1 (H)  21.9 - 15.5 %   Platelets 344 150 - 400 K/uL   Neutrophils Relative % 89 (H) 43 - 77 %   Neutro Abs 13.1 (H) 1.7 - 7.7 K/uL   Lymphocytes Relative 5 (L) 12 - 46 %   Lymphs Abs 0.7 0.7 - 4.0 K/uL   Monocytes Relative 6 3 - 12 %   Monocytes Absolute 0.8 0.1 - 1.0 K/uL   Eosinophils Relative 0 0 - 5 %   Eosinophils Absolute 0.0 0.0 - 0.7 K/uL   Basophils Relative 0 0 - 1 %   Basophils Absolute 0.0 0.0 - 0.1 K/uL  Urinalysis, Routine w reflex microscopic  Result Value Ref Range   Color, Urine YELLOW YELLOW   APPearance CLEAR CLEAR   Specific Gravity, Urine >1.030 (H) 1.005 - 1.030   pH 5.5 5.0 - 8.0   Glucose, UA NEGATIVE NEGATIVE mg/dL   Hgb urine dipstick NEGATIVE NEGATIVE   Bilirubin Urine NEGATIVE NEGATIVE   Ketones, ur NEGATIVE NEGATIVE mg/dL   Protein, ur NEGATIVE NEGATIVE mg/dL   Urobilinogen, UA 0.2 0.0 - 1.0 mg/dL   Nitrite NEGATIVE NEGATIVE   Leukocytes, UA NEGATIVE NEGATIVE  I-Stat CG4 Lactic Acid, ED  Result Value Ref Range   Lactic Acid, Venous 1.06 0.5 - 2.0 mmol/L  I-stat Chem 8, ED  Result Value Ref Range   Sodium 138 135 - 145 mmol/L   Potassium 5.2 (H) 3.5 - 5.1 mmol/L   Chloride 107 96 - 112 mmol/L   BUN 67 (H) 6 - 23 mg/dL   Creatinine, Ser 7.58 (H) 0.50 - 1.35 mg/dL   Glucose, Bld 832 (H) 70 - 99 mg/dL   Calcium, Ion 5.49 8.26 - 1.30 mmol/L   TCO2 18 0 - 100 mmol/L   Hemoglobin 11.6 (L) 13.0 - 17.0 g/dL   HCT 41.5 (L) 83.0 - 94.0 %  I-stat troponin, ED  Result Value Ref Range   Troponin i, poc 0.03 0.00 - 0.08 ng/mL   Comment 3           Dg Chest Port 1 View 09/03/2014   CLINICAL DATA:  81 year old male with renal failure and left heel infection.  EXAM: PORTABLE  CHEST - 1 VIEW  COMPARISON:  Chest x-ray 04/11/2014.  FINDINGS: Lung volumes are low. Study is limited by patient's marked rotation to the left which distorts normal anatomic structures. With these limitations in mind, there are some bibasilar opacities favored to reflect subsegmental  atelectasis. No definite consolidative airspace disease. No pleural effusions. No evidence of pulmonary edema. Heart size appears upper limits of normal. The patient is rotated to the left on today's exam, resulting in distortion of the mediastinal contours and reduced diagnostic sensitivity and specificity for mediastinal pathology. Atherosclerosis in the thoracic aorta. Status post median sternotomy.  IMPRESSION: 1. Low lung volumes with probable bibasilar subsegmental atelectasis. 2. Atherosclerosis.   Electronically Signed   By: Trudie Reed M.D.   On: 09/03/2014 17:56   Dg Foot 2 Views Left 09/03/2014   CLINICAL DATA:  Infection left heel, pt sent from nursing home, unable to elaborate on pain or symptoms, nurse tech from home did not have any history information. Pt in fetal position, held for films.  EXAM: LEFT FOOT - 2 VIEW  COMPARISON:  None.  FINDINGS: No fracture dislocation.  No bone resorption is seen to indicate osteomyelitis. No soft tissue air.  Osteoarthritic changes are noted most evident at the first metatarsophalangeal joint.  There are vascular calcifications along the dorsalis pedis and posterior tibial arteries.  Bones are demineralized.  IMPRESSION: 1. No fracture or acute bony abnormality. No evidence of osteomyelitis.   Electronically Signed   By: Amie Portland M.D.   On: 09/03/2014 17:55    1900:  SBP 60's on arrival. Has increased to 90's after IVF bolus. Will continue judicious IVF for acute renal failure. No signs of infection at this time; doubt sepsis. Dx and testing d/w pt's family.  Questions answered.  Verb understanding, agreeable to admit.   Samuel Jester, DO 09/06/14 1338

## 2014-09-03 NOTE — ED Notes (Signed)
Informed nurse Dorothy Puffer, at [facility] that pt will be admitted for acute renal failure, secretary informed family.

## 2014-09-03 NOTE — Progress Notes (Signed)
Called for report for pt.

## 2014-09-03 NOTE — ED Notes (Signed)
MD at bedside. 

## 2014-09-03 NOTE — ED Notes (Signed)
Pt sent from [facility] for lt heel infection, renal failure, and possible sepsis.

## 2014-09-03 NOTE — ED Notes (Signed)
[facility] to bring over DNR paperwork .

## 2014-09-04 ENCOUNTER — Inpatient Hospital Stay (HOSPITAL_COMMUNITY): Payer: Medicare Other

## 2014-09-04 ENCOUNTER — Encounter (HOSPITAL_COMMUNITY): Payer: Self-pay | Admitting: *Deleted

## 2014-09-04 DIAGNOSIS — I255 Ischemic cardiomyopathy: Secondary | ICD-10-CM

## 2014-09-04 DIAGNOSIS — E872 Acidosis, unspecified: Secondary | ICD-10-CM

## 2014-09-04 DIAGNOSIS — R7401 Elevation of levels of liver transaminase levels: Secondary | ICD-10-CM

## 2014-09-04 DIAGNOSIS — R740 Nonspecific elevation of levels of transaminase and lactic acid dehydrogenase [LDH]: Secondary | ICD-10-CM

## 2014-09-04 DIAGNOSIS — R571 Hypovolemic shock: Secondary | ICD-10-CM

## 2014-09-04 DIAGNOSIS — N179 Acute kidney failure, unspecified: Secondary | ICD-10-CM | POA: Insufficient documentation

## 2014-09-04 LAB — COMPREHENSIVE METABOLIC PANEL
ALBUMIN: 2.3 g/dL — AB (ref 3.5–5.2)
ALK PHOS: 78 U/L (ref 39–117)
ALT: 194 U/L — AB (ref 0–53)
ANION GAP: 13 (ref 5–15)
AST: 190 U/L — ABNORMAL HIGH (ref 0–37)
BILIRUBIN TOTAL: 0.8 mg/dL (ref 0.3–1.2)
BUN: 66 mg/dL — AB (ref 6–23)
CO2: 15 mmol/L — ABNORMAL LOW (ref 19–32)
CREATININE: 2.82 mg/dL — AB (ref 0.50–1.35)
Calcium: 8.2 mg/dL — ABNORMAL LOW (ref 8.4–10.5)
Chloride: 115 mmol/L — ABNORMAL HIGH (ref 96–112)
GFR calc non Af Amer: 20 mL/min — ABNORMAL LOW (ref >90)
GFR, EST AFRICAN AMERICAN: 23 mL/min — AB (ref >90)
GLUCOSE: 101 mg/dL — AB (ref 70–99)
Potassium: 4.9 mmol/L (ref 3.5–5.1)
Sodium: 143 mmol/L (ref 135–145)
Total Protein: 5.4 g/dL — ABNORMAL LOW (ref 6.0–8.3)

## 2014-09-04 LAB — CBC
HCT: 30.9 % — ABNORMAL LOW (ref 39.0–52.0)
HEMOGLOBIN: 9.5 g/dL — AB (ref 13.0–17.0)
MCH: 24.9 pg — ABNORMAL LOW (ref 26.0–34.0)
MCHC: 30.7 g/dL (ref 30.0–36.0)
MCV: 81.1 fL (ref 78.0–100.0)
PLATELETS: 297 10*3/uL (ref 150–400)
RBC: 3.81 MIL/uL — ABNORMAL LOW (ref 4.22–5.81)
RDW: 17.7 % — AB (ref 11.5–15.5)
WBC: 15.4 10*3/uL — AB (ref 4.0–10.5)

## 2014-09-04 LAB — RETICULOCYTES
RBC.: 3.97 MIL/uL — ABNORMAL LOW (ref 4.22–5.81)
RETIC CT PCT: 0.8 % (ref 0.4–3.1)
Retic Count, Absolute: 31.8 10*3/uL (ref 19.0–186.0)

## 2014-09-04 LAB — CK TOTAL AND CKMB (NOT AT ARMC)
CK TOTAL: 1958 U/L — AB (ref 7–232)
CK, MB: 59.8 ng/mL — AB (ref 0.3–4.0)
Relative Index: 3.1 — ABNORMAL HIGH (ref 0.0–2.5)

## 2014-09-04 LAB — TROPONIN I
TROPONIN I: 0.13 ng/mL — AB (ref <0.031)
TROPONIN I: 0.17 ng/mL — AB (ref <0.031)
Troponin I: 0.15 ng/mL — ABNORMAL HIGH (ref <0.031)

## 2014-09-04 LAB — MRSA PCR SCREENING: MRSA BY PCR: POSITIVE — AB

## 2014-09-04 MED ORDER — SODIUM CHLORIDE 0.9 % IV BOLUS (SEPSIS): 500.0000 mL | Freq: Once | INTRAVENOUS | Status: DC

## 2014-09-04 MED ORDER — SODIUM BICARBONATE 8.4 % IV SOLN
INTRAVENOUS | Status: AC
  Administered 2014-09-04 – 2014-09-05 (×3): via INTRAVENOUS
  Filled 2014-09-04 (×3): qty 1000

## 2014-09-04 MED ORDER — CETYLPYRIDINIUM CHLORIDE 0.05 % MT LIQD
7.0000 mL | Freq: Two times a day (BID) | OROMUCOSAL | Status: DC
  Administered 2014-09-04 – 2014-09-07 (×7): 7 mL via OROMUCOSAL

## 2014-09-04 MED ORDER — MUPIROCIN 2 % EX OINT
1.0000 "application " | TOPICAL_OINTMENT | Freq: Two times a day (BID) | CUTANEOUS | Status: DC
  Administered 2014-09-04 – 2014-09-07 (×7): 1 via NASAL
  Filled 2014-09-04 (×2): qty 22

## 2014-09-04 MED ORDER — CHLORHEXIDINE GLUCONATE CLOTH 2 % EX PADS
6.0000 | MEDICATED_PAD | Freq: Every day | CUTANEOUS | Status: DC
  Administered 2014-09-05 – 2014-09-07 (×2): 6 via TOPICAL

## 2014-09-04 NOTE — Progress Notes (Signed)
[facility] PROGRESS NOTE  Assessment/Plan: Hypovolemic shock: - He was started on NS IV fluid, antihypertensive medications were held. - His blood pressure slowly trending up. I will go ahead and give him another bolus of normal saline. - His chloride is high and his sodium is trending up will Change him on D5.  Acute renal failure: - Baseline creatinine is less than 1. Specific gravity on his urinalysis was greater than 1030. - Etiology multifactorial, most likely prerenal in the setting of Diovan and Lasix use as well as Bactrim. - Fractional secretion of sodium is pending. His Bactrim, Diovan and Lasix was held he was started on IV hydration. - There has been some mild improvement in his renal function. - Continue to monitor strict I's and O's. Urine eosinophils pending, renal ultrasound and serum electrophoresis and protein electrophoresis and process  Hyperkalemia: - Multifactorial most likely due to ARB, Lasix, Bactrim and acidosis. - Now resolved.  Leukocytosis: - Has remained afebrile, I will get a differential. - Unclear etiology question most likely stressed emargination. We'll continue to monitor his fever curve. - Left heel ulcer looks dry no purulent drainage no fluctuations no erythema or warmth to touch. - I have personally reviewed his chest x-ray looks similar to previous x-ray does not show any diffuse pattern injury or focal infiltrates.  Metabolic acidosis: - Check a lactate level. - His blood pressure was  soft and admission. His metabolic acidosis could be due to state of hypoperfusion. - Start on D5 with bicarbonate drip.  Rhabdomyolysis: - Unclear etiology, question due to hypoperfusion state. - Bactrim is known to cause rhabdomyolysis in AIDS patient, question of is contributing to the patient's rhabdomyolysis.  Transaminitis: - In the setting of Bactrim use. His bilirubin is less than 1 his alkaline phosphatase is less than 100. - His ALT is  greater than his AST which points to ischemic state. - Abdominal ultrasound is pending, will check a hepatitis panel. - Question doses elevation in transaminitis is due to his rhabdomyolysis.  Normocytic Anemia - There has been a mild drop in hemoglobin compared to previous hemoglobin and from admission. - We'll guaiac his stools. Check an anemia panel.   Cardiomyopathy, ischemic - No appreciated JVD, continue amiodarone. He is not on a beta blocker. - Continue to hold his ARB and Lasix.   Code Status: full Family Communication: none  Disposition Plan: inpatient   Consultants:  none  Procedures:  CXR  Antibiotics:  None  HPI/Subjective: Relates he feels better  Objective: Filed Vitals:   09/04/14 0330 09/04/14 0400 09/04/14 0430 09/04/14 0500  BP: 92/35 94/37 90/40  92/42  Pulse: 57 57 56 57  Temp:      TempSrc:      Resp: 13 19 18 19   Height:      Weight:    47.3 kg (104 lb 4.4 oz)  SpO2: 94% 95% 93% 94%    Intake/Output Summary (Last 24 hours) at 09/04/14 0745 Last data filed at 09/04/14 0500  Gross per 24 hour  Intake 746.25 ml  Output    400 ml  Net 346.25 ml   Filed Weights   09/03/14 1529 09/03/14 2300 09/04/14 0500  Weight: 38.556 kg (85 lb) 44.8 kg (98 lb 12.3 oz) 47.3 kg (104 lb 4.4 oz)    Exam:  General: Alert, awake, oriented x3, in no acute distress.  HEENT: No bruits, no goiter. -JVD Heart: Regular rate and rhythm. Lungs: Good air movement, clear Abdomen: Soft, nontender, nondistended,  positive bowel sounds.  Neuro: Grossly intact, nonfocal.   Data Reviewed: Basic Metabolic Panel:  Recent Labs Lab 09/03/14 1714 09/04/14 0605  NA 138 143  K 5.2* 4.9  CL 107 115*  CO2  --  15*  GLUCOSE 121* 101*  BUN 67* 66*  CREATININE 3.40* 2.82*  CALCIUM  --  8.2*   Liver Function Tests:  Recent Labs Lab 09/04/14 0605  AST 190*  ALT 194*  ALKPHOS 78  BILITOT 0.8  PROT 5.4*  ALBUMIN 2.3*   No results for input(s): LIPASE,  AMYLASE in the last 168 hours. No results for input(s): AMMONIA in the last 168 hours. CBC:  Recent Labs Lab 09/03/14 1700 09/03/14 1714 09/04/14 0605  WBC 14.7*  --  15.4*  NEUTROABS 13.1*  --   --   HGB 10.5* 11.6* 9.5*  HCT 34.1* 34.0* 30.9*  MCV 81.0  --  81.1  PLT 344  --  297   Cardiac Enzymes:  Recent Labs Lab 09/03/14 2313 09/04/14 0200 09/04/14 0605  CKTOTAL  --  1958*  --   CKMB  --  PENDING  --   TROPONINI 0.13*  --  0.17*   BNP (last 3 results) No results for input(s): PROBNP in the last 8760 hours. CBG: No results for input(s): GLUCAP in the last 168 hours.  Recent Results (from the past 240 hour(s))  MRSA PCR Screening     Status: Abnormal   Collection Time: 09/03/14 11:00 PM  Result Value Ref Range Status   MRSA by PCR POSITIVE (A) NEGATIVE Final    Comment:        The GeneXpert MRSA Assay (FDA approved for NASAL specimens only), is one component of a comprehensive MRSA colonization surveillance program. It is not intended to diagnose MRSA infection nor to guide or monitor treatment for MRSA infections. RESULT CALLED TO, READ BACK BY AND VERIFIED WITH:  BREWER,D @ 0455 ON 09/04/14 BY Anner Crete      Studies: Dg Chest Port 1 View  09/03/2014   CLINICAL DATA:  81 year old male with renal failure and left heel infection.  EXAM: PORTABLE CHEST - 1 VIEW  COMPARISON:  Chest x-ray 04/11/2014.  FINDINGS: Lung volumes are low. Study is limited by patient's marked rotation to the left which distorts normal anatomic structures. With these limitations in mind, there are some bibasilar opacities favored to reflect subsegmental atelectasis. No definite consolidative airspace disease. No pleural effusions. No evidence of pulmonary edema. Heart size appears upper limits of normal. The patient is rotated to the left on today's exam, resulting in distortion of the mediastinal contours and reduced diagnostic sensitivity and specificity for mediastinal pathology.  Atherosclerosis in the thoracic aorta. Status post median sternotomy.  IMPRESSION: 1. Low lung volumes with probable bibasilar subsegmental atelectasis. 2. Atherosclerosis.   Electronically Signed   By: Trudie Reed M.D.   On: 09/03/2014 17:56   Dg Foot 2 Views Left  09/03/2014   CLINICAL DATA:  Infection left heel, pt sent from nursing home, unable to elaborate on pain or symptoms, nurse tech from home did not have any history information. Pt in fetal position, held for films.  EXAM: LEFT FOOT - 2 VIEW  COMPARISON:  None.  FINDINGS: No fracture dislocation.  No bone resorption is seen to indicate osteomyelitis. No soft tissue air.  Osteoarthritic changes are noted most evident at the first metatarsophalangeal joint.  There are vascular calcifications along the dorsalis pedis and posterior tibial arteries.  Bones are demineralized.  IMPRESSION: 1. No fracture or acute bony abnormality. No evidence of osteomyelitis.   Electronically Signed   By: Amie Portland M.D.   On: 09/03/2014 17:55    Scheduled Meds: . sodium chloride   Intravenous STAT  . amiodarone  200 mg Oral Daily  . antiseptic oral rinse  7 mL Mouth Rinse BID  . Chlorhexidine Gluconate Cloth  6 each Topical Q0600  . cholecalciferol   Oral Daily  . citalopram  20 mg Oral Daily  . dutasteride  0.5 mg Oral Daily  . enoxaparin (LOVENOX) injection  30 mg Subcutaneous Q24H  . ezetimibe  10 mg Oral Daily  . feeding supplement (PRO-STAT SUGAR FREE 64)  30 mL Oral BID  . feeding supplement (RESOURCE BREEZE)  1 Container Oral TID BM  . ferrous sulfate  325 mg Oral BID WC  . magnesium oxide  400 mg Oral Daily  . mupirocin ointment  1 application Nasal BID  . pantoprazole  40 mg Oral BID  . risperiDONE  0.5 mg Oral QHS  . senna  1 tablet Oral QHS  . simvastatin  20 mg Oral QHS  . sodium chloride  500 mL Intravenous Once  . sodium chloride  3 mL Intravenous Q12H  . vitamin B-1  250 mg Oral Daily   Continuous Infusions: . sodium  chloride 75 mL/hr at 09/04/14 0500     Marinda Elk  [facility] Pager [phone]. If 8PM-8AM, please contact night-coverage at [email or web address], password [facility] 09/04/2014, 7:45 AM  LOS: 1 day

## 2014-09-04 NOTE — Plan of Care (Signed)
Problem: Consults Goal: ARF/New Onset CRF Patient Education See Patient Education Module for education specifics. Outcome: Progressing Admitted with renal failure, Creatinine 3.40.  24 hr. Urine collection begun..    Goal: Skin Care Protocol Initiated - if Braden Score 18 or less If consults are not indicated, leave blank or document N/A Outcome: Progressing Sacrum and left heel breakdown, due to pressure Goal: Nutrition Consult-if indicated Outcome: Progressing Feeding resource ordered, tonight patient is to drowsy to take anything PO, seems tired  Problem: Phase I Progression Outcomes Goal: Initial discharge plan identified Outcome: Progressing From [facility] Goal: Hemodynamically stable Outcome: Progressing BP is low with an EF of 20-25%

## 2014-09-04 NOTE — Progress Notes (Signed)
CRITICAL VALUE ALERT  Critical value received:  CK MB 59.8  Date of notification:  09/04/14  Time of notification:  2050  Critical value read back:Yes.    Nurse who received alert:  Lawson Fiscal RN  MD notified (1st page):  Burnadette Peter  Time of first page:  2058  MD notified (2nd page): Burnadette Peter  Time of second page:2110  Responding MD:  Time MD responded:

## 2014-09-04 NOTE — Progress Notes (Signed)
INITIAL NUTRITION ASSESSMENT  DOCUMENTATION CODES Per approved criteria  -Not Applicable   INTERVENTION: 1. Pt currently Npo, will Provide Ensure  Or other snacks to patient when able   NUTRITION DIAGNOSIS: Inadequate oral intake related to decrease in appetite as evidenced by loss of ~20 pounds since September  Goal: Pt to meet >/= 90% of their estimated nutrition needs   Monitor:  Oral intake, labs, weight  Reason for Assessment: MST of 2  81 y.o. male  Admitting Dx: <principal problem not specified>  ASSESSMENT: 81 yo male with hypertension, hyperlipidemia, CAD was being treated for mrsa of the right heel with bactrim, and found to be in ARF  Pt will be admitted for ARF, ? Secondary to bactrim  Pt has very low BMI though he appears to have been this weight 3 months ago. He reports weight loss.   Height: Ht Readings from Last 1 Encounters:  09/03/14 5\' 7"  (1.702 m)    Weight: Wt Readings from Last 1 Encounters:  09/04/14 104 lb 4.4 oz (47.3 kg)    Ideal Body Weight: 148  % Ideal Body Weight: 70%  Wt Readings from Last 10 Encounters:  09/04/14 104 lb 4.4 oz (47.3 kg)  05/14/14 102 lb (46.267 kg)  04/13/14 123 lb 10.9 oz (56.1 kg)  12/04/13 115 lb (52.164 kg)  06/29/13 120 lb 8 oz (54.658 kg)  03/19/13 123 lb 12 oz (56.133 kg)  01/29/13 143 lb 15.4 oz (65.3 kg)  04/17/12 128 lb (58.06 kg)  02/07/12 120 lb (54.432 kg)  02/05/12 122 lb 3.2 oz (55.43 kg)    Usual Body Weight: pt appears to be ~120  % Usual Body Weight: 86%  BMI:  Body mass index is 16.33 kg/(m^2).  Estimated Nutritional Needs: Kcal: 1400-1650 Protein: 52-62 Fluid: 1400-1650  Skin: MSAD on sacrum, pale pink skin  Diet Order:  NPO  EDUCATION NEEDS: -No education needs identified at this time   Intake/Output Summary (Last 24 hours) at 09/04/14 1604 Last data filed at 09/04/14 0500  Gross per 24 hour  Intake 746.25 ml  Output    400 ml  Net 346.25 ml    Last BM:  1/28  Labs:   Recent Labs Lab 09/03/14 1714 09/04/14 0605  NA 138 143  K 5.2* 4.9  CL 107 115*  CO2  --  15*  BUN 67* 66*  CREATININE 3.40* 2.82*  CALCIUM  --  8.2*  GLUCOSE 121* 101*    CBG (last 3)  No results for input(s): GLUCAP in the last 72 hours.  Scheduled Meds: . amiodarone  200 mg Oral Daily  . antiseptic oral rinse  7 mL Mouth Rinse BID  . Chlorhexidine Gluconate Cloth  6 each Topical Q0600  . cholecalciferol   Oral Daily  . citalopram  20 mg Oral Daily  . dutasteride  0.5 mg Oral Daily  . enoxaparin (LOVENOX) injection  30 mg Subcutaneous Q24H  . ezetimibe  10 mg Oral Daily  . feeding supplement (PRO-STAT SUGAR FREE 64)  30 mL Oral BID  . feeding supplement (RESOURCE BREEZE)  1 Container Oral TID BM  . ferrous sulfate  325 mg Oral BID WC  . magnesium oxide  400 mg Oral Daily  . mupirocin ointment  1 application Nasal BID  . pantoprazole  40 mg Oral BID  . risperiDONE  0.5 mg Oral QHS  . senna  1 tablet Oral QHS  . simvastatin  20 mg Oral QHS  . sodium chloride  500 mL Intravenous Once  . sodium chloride  500 mL Intravenous Once  . sodium chloride  3 mL Intravenous Q12H  . vitamin B-1  250 mg Oral Daily    Continuous Infusions: . dextrose 5 % 1,000 mL with sodium bicarbonate 100 mEq infusion 100 mL/hr at 09/04/14 8101    Past Medical History  Diagnosis Date  . Coronary atherosclerosis of native coronary artery     Multivessel  . Essential hypertension, benign   . Mixed hyperlipidemia   . Erectile dysfunction   . Osteoarthritis   . Myocardial infarction     IMI 1987 and PLMI 1997  . Rheumatoid arthritis, adult   . DNR (do not resuscitate)   . Cardiac arrest 05/2014  . Ischemic cardiomyopathy     Past Surgical History  Procedure Laterality Date  . Coronary artery bypass graft  1987  . Carpal tunnel release    . Rotator cuff repair    . Neck surgery    . Esophagogastroduodenoscopy N/A 01/23/2013    Procedure: ESOPHAGOGASTRODUODENOSCOPY  (EGD);  Surgeon: Shirley Friar, MD;  Location: [facility] ENDOSCOPY;  Service: Endoscopy;  Laterality: N/A;  . Esophagogastroduodenoscopy N/A 01/23/2013    Procedure: ESOPHAGOGASTRODUODENOSCOPY (EGD);  Surgeon: Shirley Friar, MD;  Location: [facility] ENDOSCOPY;  Service: Endoscopy;  Laterality: N/A;  . Laparotomy N/A 04/06/2014    Procedure: EXPLORATORY LAPAROTOMY, Lucious Groves;  Surgeon: Dalia Heading, MD;  Location: [facility] ORS;  Service: General;  Laterality: N/A;  . Central venous catheter insertion Left 04/06/2014    Procedure: INSERTION CENTRAL LINE ADULT;  Surgeon: Dalia Heading, MD;  Location: [facility] ORS;  Service: General;  Laterality: Left;    Christophe Louis RD, LDN Nutrition [phone] 09/04/2014 4:04 PM

## 2014-09-05 LAB — IRON AND TIBC: UIBC: 155 ug/dL (ref 125–400)

## 2014-09-05 LAB — HEPATIC FUNCTION PANEL
ALT: 208 U/L — AB (ref 0–53)
AST: 142 U/L — AB (ref 0–37)
Albumin: 2.2 g/dL — ABNORMAL LOW (ref 3.5–5.2)
Alkaline Phosphatase: 78 U/L (ref 39–117)
BILIRUBIN DIRECT: 0.3 mg/dL (ref 0.0–0.5)
BILIRUBIN INDIRECT: 0.3 mg/dL (ref 0.3–0.9)
Total Bilirubin: 0.6 mg/dL (ref 0.3–1.2)
Total Protein: 5.5 g/dL — ABNORMAL LOW (ref 6.0–8.3)

## 2014-09-05 LAB — CALCIUM / CREATININE RATIO, URINE
Calcium, Ur: 1 mg/dL
Calcium/Creat.Ratio: 0
Creatinine, Urine: 89.1 mg/dL

## 2014-09-05 LAB — HEPATITIS PANEL, ACUTE
HCV Ab: NEGATIVE
Hep A IgM: NONREACTIVE
Hep B C IgM: NONREACTIVE
Hepatitis B Surface Ag: NEGATIVE

## 2014-09-05 LAB — CBC
HCT: 28.8 % — ABNORMAL LOW (ref 39.0–52.0)
Hemoglobin: 9.1 g/dL — ABNORMAL LOW (ref 13.0–17.0)
MCH: 24.9 pg — ABNORMAL LOW (ref 26.0–34.0)
MCHC: 31.6 g/dL (ref 30.0–36.0)
MCV: 78.9 fL (ref 78.0–100.0)
Platelets: 296 K/uL (ref 150–400)
RBC: 3.65 MIL/uL — ABNORMAL LOW (ref 4.22–5.81)
RDW: 17.3 % — ABNORMAL HIGH (ref 11.5–15.5)
WBC: 14.2 K/uL — ABNORMAL HIGH (ref 4.0–10.5)

## 2014-09-05 LAB — URINE CULTURE
CULTURE: NO GROWTH
Colony Count: NO GROWTH

## 2014-09-05 LAB — FOLATE: Folate: 17.3 ng/mL

## 2014-09-05 LAB — FERRITIN: FERRITIN: 564 ng/mL — AB (ref 22–322)

## 2014-09-05 LAB — SODIUM, URINE, RANDOM: Sodium, Ur: 39 mmol/L

## 2014-09-05 LAB — VITAMIN B12
Vitamin B-12: 452 pg/mL (ref 211–911)
Vitamin B-12: 543 pg/mL (ref 211–911)

## 2014-09-05 LAB — CK: Total CK: 642 U/L — ABNORMAL HIGH (ref 7–232)

## 2014-09-05 LAB — LACTIC ACID, PLASMA: LACTIC ACID, VENOUS: 1 mmol/L (ref 0.5–2.0)

## 2014-09-05 MED ORDER — COLLAGENASE 250 UNIT/GM EX OINT
TOPICAL_OINTMENT | Freq: Every day | CUTANEOUS | Status: DC
  Administered 2014-09-05 – 2014-09-07 (×3): via TOPICAL
  Filled 2014-09-05: qty 30

## 2014-09-05 NOTE — Progress Notes (Signed)
Pt arrived up to 300 from ICU this afternoon. Pt diet advanced to dysphagia, requiring pt to be fed. Prevalon boots applied to pts feet to help support the heels, and there is a clean and intact pink foam dressing to the pt's left heel. Pt had large BM upon arrival to floor. Pt currently using Posey belt as a form of activity for his hands and seems to keep him content. Will continue to monitor

## 2014-09-05 NOTE — Progress Notes (Signed)
Report given to 300 RN. Patient will be transported on bed. All pills and medication given until 1400

## 2014-09-05 NOTE — Progress Notes (Signed)
[facility] PROGRESS NOTE  Assessment/Plan: Hypovolemic shock: - KVO IV fluids, dysphagia 2 diet. Continue to hold antihypertensive medications. - BP has improved, b-met pending. - Cont to monitor blood pressure. - Transferred to telemetry floor.  Acute renal failure: - Baseline creatinine is less than 1. Specific gravity on his urinalysis was greater than 1030. - Etiology multifactorial, most likely prerenal in the setting of Diovan and Lasix use as well as Bactrim. Continue these medications. - Basic metabolic panel pending. Having good urine output urine is clearing up.  Hyperkalemia: - Multifactorial most likely due to ARB, Lasix, Bactrim and acidosis. - Now resolved.  Leukocytosis: - Has remained afebrile,Unclear etiology question most likely stressed emargination. - I have personally reviewed his chest x-ray looks similar to previous x-ray does not show any diffuse pattern injury or focal infiltrates.  Metabolic acidosis: - Lactate level is pending.  - Due state of hypoperfusion.BP now improved.  - KVO IV fluids repeat a basic metabolic panel.  Rhabdomyolysis: - we'll recheck a CK today. - Unclear etiology, question due to hypoperfusion state. - Bactrim is known to cause rhabdomyolysis in AIDS patient, question of is contributing to the patient's rhabdomyolysis.  Transaminitis: - In the setting of Bactrim use. His bilirubin is less than 1 his alkaline phosphatase is less than 100. -  hepatitis panel is negative, most likely due to rhabdomyolysis.  Normocytic Anemia - There has been a mild drop in hemoglobin compared to previous hemoglobin and from admission.Has not had a bowel movement, will start Mira lax. Anemia panels pending.    Cardiomyopathy, ischemic - No appreciated JVD, continue amiodarone. He is not on a beta blocker. - Continue to hold his ARB and Lasix.   Code Status: full Family Communication: none  Disposition Plan:  inpatient   Consultants:  none  Procedures:  CXR  Antibiotics:  None  HPI/Subjective: Relates he feels better, Will like to get up and walk.   Objective: Filed Vitals:   09/04/14 2000 09/05/14 0000 09/05/14 0400 09/05/14 0500  BP: 99/43 131/56 141/64   Pulse: 56 68 65   Temp: 98 F (36.7 C) 98.6 F (37 C) 97.8 F (36.6 C)   TempSrc: Axillary Axillary Axillary   Resp: $Remo'20 12 15   'gXaDN$ Height:      Weight:    45.723 kg (100 lb 12.8 oz)  SpO2: 100% 100% 100%     Intake/Output Summary (Last 24 hours) at 09/05/14 0809 Last data filed at 09/04/14 2300  Gross per 24 hour  Intake    200 ml  Output    400 ml  Net   -200 ml   Filed Weights   09/03/14 2300 09/04/14 0500 09/05/14 0500  Weight: 44.8 kg (98 lb 12.3 oz) 47.3 kg (104 lb 4.4 oz) 45.723 kg (100 lb 12.8 oz)    Exam:  General: Alert, awake, oriented x3, in no acute distress.  HEENT: No bruits, no goiter. - JVD Heart: Regular rate and rhythm. Lungs: Good air movement, clear Abdomen: Soft, nontender, nondistended, positive bowel sounds.  Neuro: Grossly intact, nonfocal.   Data Reviewed: Basic Metabolic Panel:  Recent Labs Lab 09/03/14 1714 09/04/14 0605  NA 138 143  K 5.2* 4.9  CL 107 115*  CO2  --  15*  GLUCOSE 121* 101*  BUN 67* 66*  CREATININE 3.40* 2.82*  CALCIUM  --  8.2*   Liver Function Tests:  Recent Labs Lab 09/04/14 0605  AST 190*  ALT 194*  ALKPHOS 78  BILITOT  0.8  PROT 5.4*  ALBUMIN 2.3*   No results for input(s): LIPASE, AMYLASE in the last 168 hours. No results for input(s): AMMONIA in the last 168 hours. CBC:  Recent Labs Lab 09/03/14 1700 09/03/14 1714 09/04/14 0605  WBC 14.7*  --  15.4*  NEUTROABS 13.1*  --   --   HGB 10.5* 11.6* 9.5*  HCT 34.1* 34.0* 30.9*  MCV 81.0  --  81.1  PLT 344  --  297   Cardiac Enzymes:  Recent Labs Lab 09/03/14 2313 09/04/14 0200 09/04/14 0605 09/04/14 1246  CKTOTAL  --  1958*  --   --   CKMB  --  59.8*  --   --   TROPONINI  0.13*  --  0.17* 0.15*   BNP (last 3 results) No results for input(s): PROBNP in the last 8760 hours. CBG: No results for input(s): GLUCAP in the last 168 hours.  Recent Results (from the past 240 hour(s))  MRSA PCR Screening     Status: Abnormal   Collection Time: 09/03/14 11:00 PM  Result Value Ref Range Status   MRSA by PCR POSITIVE (A) NEGATIVE Final    Comment:        The GeneXpert MRSA Assay (FDA approved for NASAL specimens only), is one component of a comprehensive MRSA colonization surveillance program. It is not intended to diagnose MRSA infection nor to guide or monitor treatment for MRSA infections. RESULT CALLED TO, READ BACK BY AND VERIFIED WITH:  BREWER,D @ 0455 ON 09/04/14 BY WOODIE,J      Studies: US Renal  09/04/2014   CLINICAL DATA:  Acute renal failure  EXAM: RENAL/URINARY TRACT ULTRASOUND COMPLETE  COMPARISON:  CT, 04/06/2014  FINDINGS: Right Kidney:  Length: 11.9 cm. Mild increased parenchymal echogenicity. Mild cortical thinning. No renal mass or stone. No hydronephrosis.  Left Kidney:  Length: 11.6 cm. Marked hydronephrosis. Significant parenchymal thinning surrounding the dilated collecting system. No convincing renal mass or definitive stone. Some fluid is seen medial to left kidney which may reflect loculated ascites.  Bladder:  Decompressed by a Foley catheter.  IMPRESSION: 1. Marked left hydronephrosis, which was present on the prior CT scan. 2. No right hydronephrosis. Right renal cortical thinning and mild increased parenchymal echogenicity suggesting medical renal disease. 3. No renal masses.   Electronically Signed   By: Lajean Manes M.D.   On: 09/04/2014 14:02   Dg Chest Port 1 View  09/03/2014   CLINICAL DATA:  81 year old male with renal failure and left heel infection.  EXAM: PORTABLE CHEST - 1 VIEW  COMPARISON:  Chest x-ray 04/11/2014.  FINDINGS: Lung volumes are low. Study is limited by patient's marked rotation to the left which distorts normal  anatomic structures. With these limitations in mind, there are some bibasilar opacities favored to reflect subsegmental atelectasis. No definite consolidative airspace disease. No pleural effusions. No evidence of pulmonary edema. Heart size appears upper limits of normal. The patient is rotated to the left on today's exam, resulting in distortion of the mediastinal contours and reduced diagnostic sensitivity and specificity for mediastinal pathology. Atherosclerosis in the thoracic aorta. Status post median sternotomy.  IMPRESSION: 1. Low lung volumes with probable bibasilar subsegmental atelectasis. 2. Atherosclerosis.   Electronically Signed   By: Vinnie Langton M.D.   On: 09/03/2014 17:56   Dg Foot 2 Views Left  09/03/2014   CLINICAL DATA:  Infection left heel, pt sent from nursing home, unable to elaborate on pain or symptoms, nurse tech from home did  not have any history information. Pt in fetal position, held for films.  EXAM: LEFT FOOT - 2 VIEW  COMPARISON:  None.  FINDINGS: No fracture dislocation.  No bone resorption is seen to indicate osteomyelitis. No soft tissue air.  Osteoarthritic changes are noted most evident at the first metatarsophalangeal joint.  There are vascular calcifications along the dorsalis pedis and posterior tibial arteries.  Bones are demineralized.  IMPRESSION: 1. No fracture or acute bony abnormality. No evidence of osteomyelitis.   Electronically Signed   By: Lajean Manes M.D.   On: 09/03/2014 17:55    Scheduled Meds: . amiodarone  200 mg Oral Daily  . antiseptic oral rinse  7 mL Mouth Rinse BID  . Chlorhexidine Gluconate Cloth  6 each Topical Q0600  . cholecalciferol   Oral Daily  . citalopram  20 mg Oral Daily  . dutasteride  0.5 mg Oral Daily  . enoxaparin (LOVENOX) injection  30 mg Subcutaneous Q24H  . ezetimibe  10 mg Oral Daily  . feeding supplement (PRO-STAT SUGAR FREE 64)  30 mL Oral BID  . feeding supplement (RESOURCE BREEZE)  1 Container Oral TID BM  .  ferrous sulfate  325 mg Oral BID WC  . magnesium oxide  400 mg Oral Daily  . mupirocin ointment  1 application Nasal BID  . pantoprazole  40 mg Oral BID  . risperiDONE  0.5 mg Oral QHS  . senna  1 tablet Oral QHS  . simvastatin  20 mg Oral QHS  . sodium chloride  500 mL Intravenous Once  . sodium chloride  500 mL Intravenous Once  . sodium chloride  3 mL Intravenous Q12H  . vitamin B-1  250 mg Oral Daily   Continuous Infusions: . dextrose 5 % 1,000 mL with sodium bicarbonate 100 mEq infusion 100 mL/hr at 09/04/14 2042     Charlynne Cousins  [facility] Pager [phone]. If 8PM-8AM, please contact night-coverage at [email or web address], password [facility] 09/05/2014, 8:09 AM  LOS: 2 days

## 2014-09-05 NOTE — Consult Note (Signed)
WOC wound consult note Reason for Consult:PrU left heel (stage 3).  Incontinence associated dermatitis (IAD) in th perineal area.  Several Stage I pressure ulcers (non-blanching erythema). Wound type:Pressure and moisture associated skin damage (MASD), specifically IAD Pressure Ulcer POA: Yes Measurement:Left heel: Stage III measuring 1.5cm x 2cm x 0.4cm.  Wound base is partially obscured by the presence of adherent yellow slough.  Small amount of serous exudate.  Perineal area with incontinence associated dermatitis in a 4cm x 6cm area with scattered pinpoint openings (partial thickness), the largest of which measures 0.2 x 0.2 x 0.2cm.  Serous exudate.  The thoracic spine and right hip present with areas of non-blanchable erythema (Stage 1 Pressure ulcers) measuring 3cm x 1cm and 2cm x 2cm, respectively. Wound bed:As described above. Drainage (amount, consistency, odor) As described above Periwound:intact, dry Dressing procedure/placement/frequency:the POC will be primarily aimed at prevention of further skin breakdown:  A mattress replacement with low air loss feature will be provided to manage microclimate and provide pressure redistribution, a pressure redistribution chair pad is provided for his use when OOB in chair as well as bilateral Prevalon Boots being provided to float his heels.  Treatment for the Stage III pressure ulcer on the heel will be collagenase (Santyl) applied once daily for 21 days.  Following that, any moisture retentive dressing should facilitate tissue repair if used in conjunction with the Prevalon Boots.  IAD affected areas will benefit from our house skin care products and those will be implemented twice daily and PRN today. Thank you for inviting [place] to consult on this nice gentleman. The WOC nursing team will not follow, but will remain available to this patient, the nursing and medical teams.  Please re-consult if needed. Thanks, Ladona Mow, MSN, RN, GNP, [place],  [facility] [phone])

## 2014-09-06 ENCOUNTER — Inpatient Hospital Stay (HOSPITAL_COMMUNITY): Payer: Medicare Other

## 2014-09-06 DIAGNOSIS — E43 Unspecified severe protein-calorie malnutrition: Secondary | ICD-10-CM

## 2014-09-06 LAB — BASIC METABOLIC PANEL
Anion gap: 5 (ref 5–15)
BUN: 34 mg/dL — AB (ref 6–23)
CALCIUM: 7.7 mg/dL — AB (ref 8.4–10.5)
CO2: 30 mmol/L (ref 19–32)
Chloride: 104 mmol/L (ref 96–112)
Creatinine, Ser: 1.1 mg/dL (ref 0.50–1.35)
GFR, EST AFRICAN AMERICAN: 72 mL/min — AB (ref >90)
GFR, EST NON AFRICAN AMERICAN: 62 mL/min — AB (ref >90)
GLUCOSE: 89 mg/dL (ref 70–99)
POTASSIUM: 3.1 mmol/L — AB (ref 3.5–5.1)
Sodium: 139 mmol/L (ref 135–145)

## 2014-09-06 LAB — RETICULOCYTES
RBC.: 3.83 MIL/uL — ABNORMAL LOW (ref 4.22–5.81)
RETIC CT PCT: 0.7 % (ref 0.4–3.1)
Retic Count, Absolute: 26.8 10*3/uL (ref 19.0–186.0)

## 2014-09-06 LAB — FOLATE RBC
FOLATE, HEMOLYSATE: 521.3 ng/mL
FOLATE, RBC: 1575 ng/mL (ref >498)
Hematocrit: 33.1 % — ABNORMAL LOW (ref 37.5–51.0)

## 2014-09-06 LAB — MAGNESIUM: Magnesium: 2.1 mg/dL (ref 1.5–2.5)

## 2014-09-06 MED ORDER — ENOXAPARIN SODIUM 40 MG/0.4ML ~~LOC~~ SOLN
40.0000 mg | SUBCUTANEOUS | Status: DC
  Administered 2014-09-07: 40 mg via SUBCUTANEOUS
  Filled 2014-09-06: qty 0.4

## 2014-09-06 MED ORDER — FUROSEMIDE 20 MG PO TABS
20.0000 mg | ORAL_TABLET | Freq: Every day | ORAL | Status: DC
  Administered 2014-09-06 – 2014-09-07 (×2): 20 mg via ORAL
  Filled 2014-09-06 (×2): qty 1

## 2014-09-06 MED ORDER — ZOLPIDEM TARTRATE 5 MG PO TABS: 5.0000 mg | ORAL_TABLET | Freq: Every evening | ORAL | Status: DC | PRN

## 2014-09-06 MED ORDER — POTASSIUM CHLORIDE CRYS ER 20 MEQ PO TBCR
40.0000 meq | EXTENDED_RELEASE_TABLET | Freq: Two times a day (BID) | ORAL | Status: AC
  Administered 2014-09-06 (×2): 40 meq via ORAL
  Filled 2014-09-06: qty 2

## 2014-09-06 MED ORDER — POTASSIUM CHLORIDE CRYS ER 20 MEQ PO TBCR
40.0000 meq | EXTENDED_RELEASE_TABLET | Freq: Two times a day (BID) | ORAL | Status: DC
  Filled 2014-09-06: qty 2

## 2014-09-06 MED ORDER — METOPROLOL TARTRATE 25 MG PO TABS
12.5000 mg | ORAL_TABLET | Freq: Two times a day (BID) | ORAL | Status: DC
  Administered 2014-09-06 – 2014-09-07 (×2): 12.5 mg via ORAL
  Filled 2014-09-06 (×2): qty 1

## 2014-09-06 MED ORDER — OXYCODONE HCL 5 MG PO TABS: 15.0000 mg | ORAL_TABLET | ORAL | Status: DC | PRN

## 2014-09-06 NOTE — Care Management Note (Addendum)
    Page 1 of 1   09/07/2014     12:46:20 PM CARE MANAGEMENT NOTE 09/07/2014  Patient:  [name], [name]   Account Number:  [id number]  Date Initiated:  09/06/2014  Documentation initiated by:  Kathyrn Sheriff  Subjective/Objective Assessment:   Pt admitted for acute renal failure. Pt is from [facility]. Pt plans to return to [facility] at discharge. CSW aware of discharge plan and will arrange for return to facility. No CM needs at this time.     Action/Plan:   Anticipated DC Date:  09/07/2014   Anticipated DC Plan:  SKILLED NURSING FACILITY  In-house referral  Clinical Social Worker      DC Planning Services  CM consult      Choice offered to / List presented to:             Status of service:  Completed, signed off Medicare Important Message given?  YES (If response is "NO", the following Medicare IM given date fields will be blank) Date Medicare IM given:  09/07/2014 Medicare IM given by:  Kathyrn Sheriff Date Additional Medicare IM given:   Additional Medicare IM given by:    Discharge Disposition:  SKILLED NURSING FACILITY  Per UR Regulation:  Reviewed for med. necessity/level of care/duration of stay  If discussed at Long Length of Stay Meetings, dates discussed:    Comments:  09/07/2014 1200 Kathyrn Sheriff, RN, MSN, CM 09/06/2014 1030 Kathyrn Sheriff, RN, MSN, CM

## 2014-09-06 NOTE — Evaluation (Signed)
Physical Therapy Evaluation Patient Details Name: WITTEN CERTAIN MRN: [id number] DOB: 1932/10/09 Today's Date: 09/06/2014   History of Present Illness  81 yo male with hypertension, hyperlipidemia, CAD was being treated for mrsa of the right heel with bactrim, and found to be in ARF and sent to ED for evaluation.  Pt denies cp, palp , sob, lower ext edema.  Pt will be admitted for ARF, ? Secondary to bactrim.    Clinical Impression  Patient lethargic in bed but willing to participate in therapy. Rolling with Max(A), supine to sit with Max(A); initially posterior lean in sitting but patient able to correct. Able to maintain static sitting with B UE support and close S. Attempted stand with walker but patient unable; switched to B knee block and patient able to come to full stand with total therapist assist, very poor standing balance. Patient does experience B hip pain with mobility. Gives inconsistent answers to prior level of assist/equipment at this time, will need to check with family when they are present. Unable to ambulate/unable to assess gait at this time. CNA staff informed that they will need Michiel Sites lift to get patient to chair. Patient will likely benefit from SNF placement and continuation of skilled therapy services in this facility.     Follow Up Recommendations SNF    Equipment Recommendations  None recommended by PT    Recommendations for Other Services OT consult;Speech consult     Precautions / Restrictions Precautions Precautions: Fall;Other (comment) Precaution Comments: Heel wounds, Prevalon boots Restrictions Weight Bearing Restrictions: No RLE Weight Bearing:  (Contractures present) LLE Weight Bearing:  (contracted) Other Position/Activity Restrictions: B contractures, heel wounds, Prevalon boots      Mobility  Bed Mobility Overal bed mobility: Needs Assistance Bed Mobility: Rolling;Supine to Sit;Sit to Supine Rolling: Max assist   Supine to sit: Max assist Sit  to supine: Mod assist   General bed mobility comments: initial posterior lean, pt able to correct with cues  Transfers Overall transfer level: Needs assistance Equipment used: Rolling walker (2 wheeled) Transfers: Sit to/from Stand Sit to Stand: Total assist         General transfer comment: Unable to stand with walker, able to come to stand with total assist and knee blocking by therapist  Ambulation/Gait             General Gait Details: Unable/not assessed  Stairs            Wheelchair Mobility    Modified Rankin (Stroke Patients Only)       Balance Overall balance assessment: Needs assistance Sitting-balance support: Bilateral upper extremity supported Sitting balance-Leahy Scale: Fair   Postural control: Posterior lean Standing balance support: Bilateral upper extremity supported Standing balance-Leahy Scale: Poor Standing balance comment: total assist to maintain stand                             Pertinent Vitals/Pain Pain Assessment:  (No pain at rest, B hip pain with movement)    Home Living Family/patient expects to be discharged to:: Skilled nursing facility                      Prior Function Level of Independence: Independent with assistive device(s)         Comments: per pt report used cane or walker as needed     Hand Dominance        Extremity/Trunk Assessment  Lower Extremity Assessment: Generalized weakness (B contractures)      Cervical / Trunk Assessment: Kyphotic  Communication   Communication: No difficulties (Mild confusion noted)  Cognition Arousal/Alertness: Awake/alert (lethargic in supine, did become awake/alert in sitting) Behavior During Therapy: WFL for tasks assessed/performed Overall Cognitive Status: Within Functional Limits for tasks assessed                      General Comments General comments (skin integrity, edema, etc.): sacral wounds, heel  wounds    Exercises        Assessment/Plan    PT Assessment Patient needs continued PT services  PT Diagnosis Difficulty walking;Abnormality of gait;Generalized weakness;Acute pain   PT Problem List Decreased strength;Decreased coordination;Pain;Decreased range of motion;Decreased cognition;Decreased activity tolerance;Decreased knowledge of use of DME;Decreased balance;Decreased safety awareness;Decreased mobility;Decreased knowledge of precautions  PT Treatment Interventions DME instruction;Therapeutic exercise;Gait training;Balance training;Manual techniques;Stair training;Wheelchair mobility training;Neuromuscular re-education;Functional mobility training;Therapeutic activities;Patient/family education   PT Goals (Current goals can be found in the Care Plan section) Acute Rehab PT Goals Patient Stated Goal: none PT Goal Formulation: Patient unable to participate in goal setting Time For Goal Achievement: 09/20/14 Potential to Achieve Goals: Fair    Frequency Min 3X/week   Barriers to discharge   Very poor mobility status    Co-evaluation               End of Session Equipment Utilized During Treatment: Gait belt;Oxygen Activity Tolerance: Patient limited by fatigue Patient left: in bed;with nursing/sitter in room;Other (comment) (with CNA for cleaning after BM)           Time: 8676-7209 PT Time Calculation (min) (ACUTE ONLY): 22 min   Charges:   PT Evaluation $Initial PT Evaluation Tier I: 1 Procedure     PT G Codes:        Nedra Hai PT, DPT [phone]

## 2014-09-06 NOTE — Progress Notes (Signed)
Nutrition Follow-up: Consult to assess nutrition status  Pt meets criteria for severe MALNUTRITION in the context of chronic illness as evidenced by severe muscle wasting to clavicles, anterior thighs and 11% wt loss in past 5 months.   Intervention:  Resource Breeze po TID, each supplement provides 250 kcal and 9 grams of protein   ProStat 30 ml BID (each 30 ml provides 100 kcal, 15 gr protein)   NUTRITION DIAGNOSIS: Inadequate oral intake related to decrease in appetite as evidenced 11% wt loss < 6 months   Assessment:  Pt  sleepy during visit but nursing reports very good intake of nutrition supplements this morning. His breakfast tray is here and 100% of eggs and magic cup also consumed. He needs assistance with feeding.   Nutrition focused exam: severe wasting to clavicles, and anterior thigh. Moderate wasting to temporal region. Severe fat mass depletion to upper arms and moderate loss of fat mass noted to orbtial region. Unable to assess calf due to SCD's  Diet Order:  His diet has been downgraded due to poor dentition.   Wt Readings from Last 10 Encounters:  09/06/14 110 lb 7.2 oz (50.1 kg)  05/14/14 102 lb (46.267 kg)  04/13/14 123 lb 10.9 oz (56.1 kg)  12/04/13 115 lb (52.164 kg)  06/29/13 120 lb 8 oz (54.658 kg)  03/19/13 123 lb 12 oz (56.133 kg)  01/29/13 143 lb 15.4 oz (65.3 kg)  04/17/12 128 lb (58.06 kg)  02/07/12 120 lb (54.432 kg)  02/05/12 122 lb 3.2 oz (55.43 kg)      Meds: Scheduled Meds: . amiodarone  200 mg Oral Daily  . antiseptic oral rinse  7 mL Mouth Rinse BID  . Chlorhexidine Gluconate Cloth  6 each Topical Q0600  . cholecalciferol   Oral Daily  . citalopram  20 mg Oral Daily  . collagenase   Topical Daily  . dutasteride  0.5 mg Oral Daily  . enoxaparin (LOVENOX) injection  30 mg Subcutaneous Q24H  . ezetimibe  10 mg Oral Daily  . feeding supplement (PRO-STAT SUGAR FREE 64)  30 mL Oral BID  . feeding supplement (RESOURCE BREEZE)  1 Container Oral  TID BM  . ferrous sulfate  325 mg Oral BID WC  . furosemide  20 mg Oral Daily  . magnesium oxide  400 mg Oral Daily  . metoprolol tartrate  12.5 mg Oral BID  . mupirocin ointment  1 application Nasal BID  . pantoprazole  40 mg Oral BID  . potassium chloride  40 mEq Oral BID  . risperiDONE  0.5 mg Oral QHS  . senna  1 tablet Oral QHS  . simvastatin  20 mg Oral QHS  . sodium chloride  500 mL Intravenous Once  . sodium chloride  500 mL Intravenous Once  . sodium chloride  3 mL Intravenous Q12H  . vitamin B-1  250 mg Oral Daily   Continuous Infusions:  PRN Meds:.acetaminophen **OR** acetaminophen, ipratropium-albuterol, nitroGLYCERIN, oxyCODONE, zolpidem   CMP     Component Value Date/Time   NA 139 09/06/2014 0510   K 3.1* 09/06/2014 0510   CL 104 09/06/2014 0510   CO2 30 09/06/2014 0510   GLUCOSE 89 09/06/2014 0510   BUN 34* 09/06/2014 0510   CREATININE 1.10 09/06/2014 0510   CREATININE 1.22 11/14/2011 1031   CALCIUM 7.7* 09/06/2014 0510   PROT 5.5* 09/05/2014 0830   ALBUMIN 2.2* 09/05/2014 0830   AST 142* 09/05/2014 0830   ALT 208* 09/05/2014 0830   ALKPHOS  78 09/05/2014 0830   BILITOT 0.6 09/05/2014 0830   GFRNONAA 62* 09/06/2014 0510   GFRAA 72* 09/06/2014 0510    CBG (last 3)  No results for input(s): GLUCAP in the last 72 hours.   Intake/Output Summary (Last 24 hours) at 09/06/14 1128 Last data filed at 09/06/14 0924  Gross per 24 hour  Intake    483 ml  Output    502 ml  Net    -19 ml     Royann Shivers MS,RD,CSG,LDN Office: [phone] Pager: [phone]

## 2014-09-06 NOTE — Progress Notes (Signed)
[facility] PROGRESS NOTE  Assessment/Plan: Hypovolemic shock: - Now resolved - KVO IV fluids, Cont dysphagia 2 diet. Continue to hold antihypertensive medications.  Acute renal failure: - Resolved with IV hydration. - Baseline creatinine is less than 1.  - Etiology multifactorial, most likely prerenal in the setting of Diovan and Lasix use as well as Bactrim.  - Having good urine output urine is clearing up. - Repeat electrolytes as needed.  Hyperkalemia: - Multifactorial most likely due to ARB, Lasix, Bactrim and acidosis. - Now resolved.  Leukocytosis/normocytic anemia: - Has remained afebrile,Unclear etiology - Check a swallowing evaluation I am concerned about aspiration. - Chest x-ray on admission was negative but he was severely internal vascularity depleted. He continues to have a leukocytosis of 14, repeat chest x-ray. - Check an anemia panel, his MCV is borderline low and his RDW is high.  Metabolic acidosis: - Due state of hypoperfusion.resolved - KVO IV fluids. - Lactate was 1.  Rhabdomyolysis: - Improved with hydration. - Etiology most likely due to hypoperfusion state. - Bactrim is known to cause rhabdomyolysis in AIDS patient, question of is contributing to the patient's rhabdomyolysis. - check a mag level  Transaminitis: - In the setting of Bactrim use and hypoperfusion state. His bilirubin is less than 1 his alkaline phosphatase is less than 100. -  hepatitis panel is negative. LFTs are slowly improving.  Cardiomyopathy, ischemic - I will go ahead and start his beta blocker and his Lasix. - Continue to hold his ARB for one more day. - His blood pressure continues to improve.  Severe protein caloric malnutrition: - Consult nutrition.  Code Status: full Family Communication: none  Disposition Plan: inpatient   Consultants:  none  Procedures:  CXR  Antibiotics:  None  HPI/Subjective: Relates he feels  better.  Objective: Filed Vitals:   09/05/14 1154 09/05/14 1442 09/05/14 2214 09/06/14 0644  BP: 98/57 115/58 124/88 116/46  Pulse: 56 65 69 56  Temp: 98.2 F (36.8 C) 98.1 F (36.7 C) 97.9 F (36.6 C) 98.2 F (36.8 C)  TempSrc: Oral Oral Oral Oral  Resp: 16 16 18 18   Height:      Weight:    50.1 kg (110 lb 7.2 oz)  SpO2: 97% 98% 95% 95%    Intake/Output Summary (Last 24 hours) at 09/06/14 0908 Last data filed at 09/06/14 0717  Gross per 24 hour  Intake    120 ml  Output    500 ml  Net   -380 ml   Filed Weights   09/04/14 0500 09/05/14 0500 09/06/14 0644  Weight: 47.3 kg (104 lb 4.4 oz) 45.723 kg (100 lb 12.8 oz) 50.1 kg (110 lb 7.2 oz)    Exam:  General: Alert, awake, oriented x3, in no acute distress.  HEENT: No bruits, no goiter. - JVD Heart: Regular rate and rhythm. Lungs: Good air movement, clear Abdomen: Soft, nontender, nondistended, positive bowel sounds.  Neuro: Grossly intact, nonfocal.   Data Reviewed: Basic Metabolic Panel:  Recent Labs Lab 09/03/14 1714 09/04/14 0605 09/06/14 0510  NA 138 143 139  K 5.2* 4.9 3.1*  CL 107 115* 104  CO2  --  15* 30  GLUCOSE 121* 101* 89  BUN 67* 66* 34*  CREATININE 3.40* 2.82* 1.10  CALCIUM  --  8.2* 7.7*   Liver Function Tests:  Recent Labs Lab 09/04/14 0605 09/05/14 0830  AST 190* 142*  ALT 194* 208*  ALKPHOS 78 78  BILITOT 0.8 0.6  PROT 5.4* 5.5*  ALBUMIN 2.3* 2.2*   No results for input(s): LIPASE, AMYLASE in the last 168 hours. No results for input(s): AMMONIA in the last 168 hours. CBC:  Recent Labs Lab 09/03/14 1700 09/03/14 1714 09/04/14 0605 09/05/14 0830  WBC 14.7*  --  15.4* 14.2*  NEUTROABS 13.1*  --   --   --   HGB 10.5* 11.6* 9.5* 9.1*  HCT 34.1* 34.0* 30.9* 28.8*  MCV 81.0  --  81.1 78.9  PLT 344  --  297 296   Cardiac Enzymes:  Recent Labs Lab 09/03/14 2313 09/04/14 0200 09/04/14 0605 09/04/14 1246 09/05/14 0830  CKTOTAL  --  1958*  --   --  642*  CKMB  --   59.8*  --   --   --   TROPONINI 0.13*  --  0.17* 0.15*  --    BNP (last 3 results) No results for input(s): PROBNP in the last 8760 hours. CBG: No results for input(s): GLUCAP in the last 168 hours.  Recent Results (from the past 240 hour(s))  Urine culture     Status: None   Collection Time: 09/03/14  6:29 PM  Result Value Ref Range Status   Specimen Description URINE, CATHETERIZED  Final   Special Requests NONE  Final   Colony Count NO GROWTH Performed at [facility]   Final   Culture NO GROWTH Performed at [facility]   Final   Report Status 09/05/2014 FINAL  Final  MRSA PCR Screening     Status: Abnormal   Collection Time: 09/03/14 11:00 PM  Result Value Ref Range Status   MRSA by PCR POSITIVE (A) NEGATIVE Final    Comment:        The GeneXpert MRSA Assay (FDA approved for NASAL specimens only), is one component of a comprehensive MRSA colonization surveillance program. It is not intended to diagnose MRSA infection nor to guide or monitor treatment for MRSA infections. RESULT CALLED TO, READ BACK BY AND VERIFIED WITH:  BREWER,D @ 0455 ON 09/04/14 BY WOODIE,J      Studies: US Renal  09/04/2014   CLINICAL DATA:  Acute renal failure  EXAM: RENAL/URINARY TRACT ULTRASOUND COMPLETE  COMPARISON:  CT, 04/06/2014  FINDINGS: Right Kidney:  Length: 11.9 cm. Mild increased parenchymal echogenicity. Mild cortical thinning. No renal mass or stone. No hydronephrosis.  Left Kidney:  Length: 11.6 cm. Marked hydronephrosis. Significant parenchymal thinning surrounding the dilated collecting system. No convincing renal mass or definitive stone. Some fluid is seen medial to left kidney which may reflect loculated ascites.  Bladder:  Decompressed by a Foley catheter.  IMPRESSION: 1. Marked left hydronephrosis, which was present on the prior CT scan. 2. No right hydronephrosis. Right renal cortical thinning and mild increased parenchymal echogenicity suggesting medical renal  disease. 3. No renal masses.   Electronically Signed   By: Amie Portland M.D.   On: 09/04/2014 14:02    Scheduled Meds: . amiodarone  200 mg Oral Daily  . antiseptic oral rinse  7 mL Mouth Rinse BID  . Chlorhexidine Gluconate Cloth  6 each Topical Q0600  . cholecalciferol   Oral Daily  . citalopram  20 mg Oral Daily  . collagenase   Topical Daily  . dutasteride  0.5 mg Oral Daily  . enoxaparin (LOVENOX) injection  30 mg Subcutaneous Q24H  . ezetimibe  10 mg Oral Daily  . feeding supplement (PRO-STAT SUGAR FREE 64)  30 mL Oral BID  . feeding supplement (RESOURCE BREEZE)  1  Container Oral TID BM  . ferrous sulfate  325 mg Oral BID WC  . magnesium oxide  400 mg Oral Daily  . mupirocin ointment  1 application Nasal BID  . pantoprazole  40 mg Oral BID  . potassium chloride  40 mEq Oral BID  . risperiDONE  0.5 mg Oral QHS  . senna  1 tablet Oral QHS  . simvastatin  20 mg Oral QHS  . sodium chloride  500 mL Intravenous Once  . sodium chloride  500 mL Intravenous Once  . sodium chloride  3 mL Intravenous Q12H  . vitamin B-1  250 mg Oral Daily   Continuous Infusions:     Marinda Elk  [facility] Pager [phone]. If 8PM-8AM, please contact night-coverage at [email or web address], password [facility] 09/06/2014, 9:08 AM  LOS: 3 days

## 2014-09-06 NOTE — Evaluation (Signed)
Clinical/Bedside Swallow Evaluation Patient Details  Name: [name] MRN: [id number] DOB: 1932/08/14  Today's Date: 09/06/2014 Time: 12:16 PM  - 12:36 PM    Past Medical History:  Past Medical History  Diagnosis Date  . Coronary atherosclerosis of native coronary artery     Multivessel  . Essential hypertension, benign   . Mixed hyperlipidemia   . Erectile dysfunction   . Osteoarthritis   . Myocardial infarction     IMI 1987 and PLMI 1997  . Rheumatoid arthritis, adult   . DNR (do not resuscitate)   . Cardiac arrest 05/2014  . Ischemic cardiomyopathy    Past Surgical History:  Past Surgical History  Procedure Laterality Date  . Coronary artery bypass graft  1987  . Carpal tunnel release    . Rotator cuff repair    . Neck surgery    . Esophagogastroduodenoscopy N/A 01/23/2013    Procedure: ESOPHAGOGASTRODUODENOSCOPY (EGD);  Surgeon: Shirley Friar, MD;  Location: [facility] ENDOSCOPY;  Service: Endoscopy;  Laterality: N/A;  . Esophagogastroduodenoscopy N/A 01/23/2013    Procedure: ESOPHAGOGASTRODUODENOSCOPY (EGD);  Surgeon: Shirley Friar, MD;  Location: [facility] ENDOSCOPY;  Service: Endoscopy;  Laterality: N/A;  . Laparotomy N/A 04/06/2014    Procedure: EXPLORATORY LAPAROTOMY, GRAHAM PLICATION;  Surgeon: Dalia Heading, MD;  Location: [facility] ORS;  Service: General;  Laterality: N/A;  . Central venous catheter insertion Left 04/06/2014    Procedure: INSERTION CENTRAL LINE ADULT;  Surgeon: Dalia Heading, MD;  Location: [facility] ORS;  Service: General;  Laterality: Left;   HPI:  [name] is a 81 yo male with hypertension, hyperlipidemia, CAD was being treated for mrsa of the right heel with bactrim, and found to be in ARF and sent to ED for evaluation. Pt denies cp, palp , sob, lower ext edema. Pt will be admitted for ARF, ? Secondary to bactrim. He was admitted from [facility] and it is anticipated he will return there post discharge. Pt with hypovolemic shock, acute renal failure,  hyperkalemia, leukocytosis, metabolic acidosis, rhabdomyolysis, transminitis, normocytic anemia, and cardiomyopathy. SLP asked to evaluate swallow function due to concern for aspiration. Current chest x-ray shows: Mild bilateral interstitial thickening which may reflect mild interstitial edema versus interstitial pneumonitis secondary to an infectious or inflammatory process.   Assessment/Recommendations/Treatment Plan    SLP Assessment Clinical Impression Statement: [name] required moderate cues for attention to task (was sleeping when I arrived) and followed simple commands after short delay. Pt edentulous with weak volitional cough. Pt shows no signs or symptoms of aspiration when presented with puree textures and thin liquids. He needed occasional verbal cues to "suck" from the straw due to decreased alertness. Pt exhibited slow oral prep, decreased mastication, and decreased posterior transit of mech soft texture which resulted in right sided buccal pocketing. Pt required verbal cues and liquid wash to clear oral cavity. Unsure of pt's baseline diet PTA. He says he lives with his daughter, but was admitted from [facility]. Recommend downgrading diet to D1/puree with thin until alertness improves. Above to RN. Risk for Aspiration: Mild Other Related Risk Factors: Lethargy, Cognitive impairment  Swallow Evaluation Recommendations Diet Recommendations: Dysphagia 1 (Puree), Thin liquid Liquid Administration via: Cup, Straw Medication Administration: Whole meds with puree Supervision: Staff to assist with self feeding, Full supervision/cueing for compensatory strategies Compensations: Slow rate, Small sips/bites, Check for pocketing, Follow solids with liquid Postural Changes and/or Swallow Maneuvers: Seated upright 90 degrees, Upright 30-60 min after meal Oral Care Recommendations: Oral care BID, Staff/trained caregiver  to provide oral care Other Recommendations: Clarify dietary  restrictions Follow up Recommendations: None  Treatment Plan Treatment Plan Recommendations: Therapy as outlined in treatment plan below Speech Therapy Frequency (ACUTE ONLY): min 2x/week Treatment Duration: 1 week Interventions: Aspiration precaution training, Compensatory techniques, Patient/family education, Trials of upgraded texture/liquids, Diet toleration management by SLP  Prognosis Prognosis for Safe Diet Advancement: Fair Barriers to Reach Goals: Cognitive deficits  Individuals Consulted Consulted and Agree with Results and Recommendations: Patient, Patient unable/family or caregiver not available, RN  Swallowing Goals    Pt will demonstrate safe and efficient consumption of least restrictive diet with use of strategies as needed.   Swallow Study Prior Functional Status   Pt admitted from [facility]. Diet PTA unknown at this time.  General  Date of Onset: 09/03/14 HPI: [name] is a 81 yo male with hypertension, hyperlipidemia, CAD was being treated for mrsa of the right heel with bactrim, and found to be in ARF and sent to ED for evaluation. Pt denies cp, palp , sob, lower ext edema. Pt will be admitted for ARF, ? Secondary to bactrim. He was admitted from [facility] and it is anticipated he will return there post discharge. Pt with hypovolemic shock, acute renal failure, hyperkalemia, leukocytosis, metabolic acidosis, rhabdomyolysis, transminitis, normocytic anemia, and cardiomyopathy. SLP asked to evaluate swallow function due to concern for aspiration. Current chest x-ray shows: Mild bilateral interstitial thickening which may reflect mild interstitial edema versus interstitial pneumonitis secondary to an infectious or inflammatory process. Type of Study: Bedside swallow evaluation Previous Swallow Assessment: None on record Diet Prior to this Study: Dysphagia 2 (chopped), Thin liquids Temperature Spikes Noted: No Respiratory Status: Nasal cannula History of Recent  Intubation: No Behavior/Cognition: Cooperative, Pleasant mood, Requires cueing, Decreased sustained attention Oral Cavity - Dentition: Edentulous Self-Feeding Abilities: Total assist Patient Positioning: Upright in bed Baseline Vocal Quality: Clear Volitional Cough: Weak Volitional Swallow: Able to elicit  Oral Motor/Sensory Function  Overall Oral Motor/Sensory Function: Appears within functional limits for tasks assessed  Consistency Results  Ice Chips Ice chips: Within functional limits Presentation: Spoon  Thin Liquid Thin Liquid: Within functional limits Presentation: Straw  Nectar Thick Liquid Nectar Thick Liquid: Not tested  Honey Thick Liquid Honey Thick Liquid: Not tested  Puree Puree: Within functional limits Presentation: Spoon  Solid Solid: Impaired Presentation: Spoon Oral Phase Impairments: Reduced lingual movement/coordination;Poor awareness of bolus;Impaired mastication;Impaired anterior to posterior transit Oral Phase Functional Implications: Right lateral sulci pocketing;Oral residue;Oral holding Other Comments: verbal cues and liquid wash to clear right buccal cavity  Thank you,  Havery Moros, CCC-SLP [phone]  PORTER,DABNEY 09/06/2014,2:19 PM

## 2014-09-06 NOTE — Care Management Utilization Note (Signed)
UR completed 

## 2014-09-06 NOTE — Clinical Social Work Psychosocial (Signed)
Clinical Social Work Department BRIEF PSYCHOSOCIAL ASSESSMENT 09/06/2014  Patient:  [name], [name]     Account Number:  [id number]     Admit date:  09/03/2014  Clinical Social Worker:  Nancie Neas  Date/Time:  09/06/2014 04:15 PM  Referred by:  CSW  Date Referred:  09/06/2014 Referred for  SNF Placement   Other Referral:   Interview type:  Other - See comment Other interview type:   Debbie at [facility]  Left voicemail for daughter    PSYCHOSOCIAL DATA Living Status:  FACILITY Admitted from facility:  [facility] Level of care:  Skilled Nursing Facility Primary support name:  [name] Primary support relationship to patient:  CHILD, ADULT Degree of support available:   supportive    CURRENT CONCERNS Current Concerns  Post-Acute Placement   Other Concerns:    SOCIAL WORK ASSESSMENT / PLAN CSW spoke with RN regarding pt who feels assessment will need to be completed with family due to mental status. CSW left voicemail for pt's daughter, [name] requesting return call. Pt has been a resident at [facility] for several months. He is nursing level of care and okay to return. Debbie at [facility] reports that the plan is for pt to be long term there.   Assessment/plan status:  Psychosocial Support/Ongoing Assessment of Needs Other assessment/ plan:   Information/referral to community resources:   [facility]    PATIENT'S/FAMILY'S RESPONSE TO PLAN OF CARE: Pt unable to participate in assessment. Family have not returned call yet. Anticipate return to [facility] when medically stable. CSW will continue to follow.       Derenda Fennel, [place] [phone]

## 2014-09-07 LAB — IRON AND TIBC
IRON: 11 ug/dL — AB (ref 42–165)
Saturation Ratios: 8 % — ABNORMAL LOW (ref 20–55)
TIBC: 137 ug/dL — ABNORMAL LOW (ref 215–435)
UIBC: 126 ug/dL (ref 125–400)

## 2014-09-07 LAB — PATHOLOGIST SMEAR REVIEW

## 2014-09-07 LAB — BASIC METABOLIC PANEL
Anion gap: 7 (ref 5–15)
BUN: 34 mg/dL — ABNORMAL HIGH (ref 6–23)
CALCIUM: 8 mg/dL — AB (ref 8.4–10.5)
CO2: 27 mmol/L (ref 19–32)
CREATININE: 0.94 mg/dL (ref 0.50–1.35)
Chloride: 109 mmol/L (ref 96–112)
GFR calc Af Amer: 90 mL/min — ABNORMAL LOW (ref >90)
GFR, EST NON AFRICAN AMERICAN: 77 mL/min — AB (ref >90)
GLUCOSE: 70 mg/dL (ref 70–99)
POTASSIUM: 4.2 mmol/L (ref 3.5–5.1)
Sodium: 143 mmol/L (ref 135–145)

## 2014-09-07 LAB — PROTEIN ELECTROPHORESIS, SERUM
ALPHA-1-GLOBULIN: 9.4 % — AB (ref 2.9–4.9)
ALPHA-2-GLOBULIN: 14.6 % — AB (ref 7.1–11.8)
Albumin ELP: 42.5 % — ABNORMAL LOW (ref 55.8–66.1)
BETA GLOBULIN: 6.4 % (ref 4.7–7.2)
Beta 2: 7.6 % — ABNORMAL HIGH (ref 3.2–6.5)
Gamma Globulin: 19.5 % — ABNORMAL HIGH (ref 11.1–18.8)
M-Spike, %: 0.28 g/dL
TOTAL PROTEIN ELP: 5.4 g/dL — AB (ref 6.0–8.3)

## 2014-09-07 LAB — FERRITIN: Ferritin: 213 ng/mL (ref 22–322)

## 2014-09-07 LAB — FOLATE: FOLATE: 9.2 ng/mL

## 2014-09-07 LAB — VITAMIN B12: VITAMIN B 12: 472 pg/mL (ref 211–911)

## 2014-09-07 MED ORDER — OXYCODONE HCL 15 MG PO TABS: 15.0000 mg | ORAL_TABLET | ORAL | Status: AC | PRN

## 2014-09-07 NOTE — Progress Notes (Signed)
Patient being transferred back to [facility]. Report called and patient being transported via EMS. IV removed and Intact. No c/o pain at this time. No pain/swelling at site.

## 2014-09-07 NOTE — Discharge Summary (Signed)
Physician Discharge Summary  [name] GMW:[id number] DOB: 1933-01-07 DOA: 09/03/2014  PCP: Samuel Jester, DO  Admit date: 09/03/2014 Discharge date: 09/07/2014  Time spent: 45 minutes  Recommendations for Outpatient Follow-up:  -Will be discharged back to SNF today. -Encourage copious fluid intake.   Discharge Diagnoses:  Active Problems:   Cardiomyopathy, ischemic   Acute renal failure   Anemia   Hyperkalemia   Metabolic acidosis   Hypovolemic shock   Transaminitis   Acute renal failure syndrome   Protein-calorie malnutrition, severe   Discharge Condition: Stable and improved  Filed Weights   09/05/14 0500 09/06/14 0644 09/07/14 0701  Weight: 45.723 kg (100 lb 12.8 oz) 50.1 kg (110 lb 7.2 oz) 48.5 kg (106 lb 14.8 oz)    History of present illness:  81 yo male with hypertension, hyperlipidemia, CAD was being treated for mrsa of the right heel with bactrim, and found to be in ARF and sent to ED for evaluation. Pt denies cp, palp , sob, lower ext edema. Pt will be admitted for ARF, ? Secondary to bactrim.   Hospital Course:   Hypovolemic Shock -Resolved. -Continue to encourage PO fluid intake at SNF.  ARF -Resolved. -Cr 0.94 on DC. -Likely pre-renal in the setting of diovan and lasix; also ?role of bactrim.  Hyperkalemia -Resolved.  Rhabdomyolysis -Improved with hydration. -?role of bactrim.  Ischemic Cardiomyopathy -Stable, no CP. -BB and lasix have been resumed. -Will hold his ARB for now. -If renal function remains stable over next week, would consider restarting,  Severe Protein-Caloric Malnutrition -Feeding supplements TID.  Procedures:  None   Consultations:  None  Discharge Instructions  Discharge Instructions    Increase activity slowly    Complete by:  As directed             Medication List    STOP taking these medications        acidophilus Caps capsule     NON FORMULARY     RESOURCE 2.0 PO     sodium  polystyrene powder  Commonly known as:  KAYEXALATE     sulfamethoxazole-trimethoprim 800-160 MG per tablet  Commonly known as:  BACTRIM DS,SEPTRA DS     tamsulosin 0.4 MG Caps capsule  Commonly known as:  FLOMAX     valsartan 40 MG tablet  Commonly known as:  DIOVAN      TAKE these medications        acetaminophen 500 MG tablet  Commonly known as:  TYLENOL  Take 1,000 mg by mouth every 6 (six) hours as needed for mild pain or moderate pain.     amiodarone 200 MG tablet  Commonly known as:  PACERONE  Take 200 mg by mouth daily.     AVODART 0.5 MG capsule  Generic drug:  dutasteride  Take 0.5 mg by mouth daily.     citalopram 10 MG tablet  Commonly known as:  CELEXA  Take 2 tablets (20 mg total) by mouth daily.     ezetimibe 10 MG tablet  Commonly known as:  ZETIA  Take 10 mg by mouth daily.     feeding supplement (PRO-STAT SUGAR FREE 64) Liqd  Take 30 mLs by mouth 2 (two) times daily.     ferrous sulfate 325 (65 FE) MG tablet  Take 325 mg by mouth 2 (two) times daily with a meal.     furosemide 20 MG tablet  Commonly known as:  LASIX  Take 1 tablet (20 mg total) by  mouth daily.     ipratropium-albuterol 0.5-2.5 (3) MG/3ML Soln  Commonly known as:  DUONEB  Take 3 mLs by nebulization every 8 (eight) hours as needed (for congestion).     magnesium oxide 400 (241.3 MG) MG tablet  Commonly known as:  MAG-OX  Take 1 tablet (400 mg total) by mouth daily.     metoprolol tartrate 12.5 mg Tabs tablet  Commonly known as:  LOPRESSOR  Take 0.5 tablets (12.5 mg total) by mouth 2 (two) times daily.     nitroGLYCERIN 0.4 MG SL tablet  Commonly known as:  NITROSTAT  Place 1 tablet (0.4 mg total) under the tongue every 5 (five) minutes as needed for chest pain.     oxyCODONE 15 MG immediate release tablet  Commonly known as:  ROXICODONE  Take 1 tablet (15 mg total) by mouth every 4 (four) hours as needed for pain.     pantoprazole 40 MG tablet  Commonly known as:   PROTONIX  Take 1 tablet (40 mg total) by mouth daily at 12 noon.     risperiDONE 0.5 MG tablet  Commonly known as:  RISPERDAL  Take 1 tablet (0.5 mg total) by mouth 2 (two) times daily.     senna 8.6 MG Tabs tablet  Commonly known as:  SENOKOT  Take 1 tablet by mouth at bedtime.     simvastatin 20 MG tablet  Commonly known as:  ZOCOR  Take 20 mg by mouth at bedtime.     vitamin B-1 250 MG tablet  Take 250 mg by mouth daily.     vitamin C 500 MG tablet  Commonly known as:  ASCORBIC ACID  Take 500 mg by mouth 2 (two) times daily.     Vitamin D 2000 UNITS Caps  Take 1 capsule by mouth daily.     zolpidem 10 MG tablet  Commonly known as:  AMBIEN  Take 5 mg by mouth at bedtime as needed for sleep.       No Known Allergies    The results of significant diagnostics from this hospitalization (including imaging, microbiology, ancillary and laboratory) are listed below for reference.    Significant Diagnostic Studies: US Renal  09/04/2014   CLINICAL DATA:  Acute renal failure  EXAM: RENAL/URINARY TRACT ULTRASOUND COMPLETE  COMPARISON:  CT, 04/06/2014  FINDINGS: Right Kidney:  Length: 11.9 cm. Mild increased parenchymal echogenicity. Mild cortical thinning. No renal mass or stone. No hydronephrosis.  Left Kidney:  Length: 11.6 cm. Marked hydronephrosis. Significant parenchymal thinning surrounding the dilated collecting system. No convincing renal mass or definitive stone. Some fluid is seen medial to left kidney which may reflect loculated ascites.  Bladder:  Decompressed by a Foley catheter.  IMPRESSION: 1. Marked left hydronephrosis, which was present on the prior CT scan. 2. No right hydronephrosis. Right renal cortical thinning and mild increased parenchymal echogenicity suggesting medical renal disease. 3. No renal masses.   Electronically Signed   By: Amie Portland M.D.   On: 09/04/2014 14:02   Dg Chest Port 1 View  09/06/2014   CLINICAL DATA:  Leukocytosis, lethargy, acute renal  failure  EXAM: PORTABLE CHEST - 1 VIEW  COMPARISON:  09/03/2014  FINDINGS: Is bilateral mild interstitial thickening. There is no focal consolidation, pleural effusion or pneumothorax. The heart and mediastinal contours are unremarkable. There is evidence of prior CABG.  There is evidence of prior right rotator cuff repair.  IMPRESSION: Mild bilateral interstitial thickening which may reflect mild interstitial edema versus interstitial  pneumonitis secondary to an infectious or inflammatory process.   Electronically Signed   By: Elige Ko   On: 09/06/2014 13:48   Dg Chest Port 1 View  09/03/2014   CLINICAL DATA:  81 year old male with renal failure and left heel infection.  EXAM: PORTABLE CHEST - 1 VIEW  COMPARISON:  Chest x-ray 04/11/2014.  FINDINGS: Lung volumes are low. Study is limited by patient's marked rotation to the left which distorts normal anatomic structures. With these limitations in mind, there are some bibasilar opacities favored to reflect subsegmental atelectasis. No definite consolidative airspace disease. No pleural effusions. No evidence of pulmonary edema. Heart size appears upper limits of normal. The patient is rotated to the left on today's exam, resulting in distortion of the mediastinal contours and reduced diagnostic sensitivity and specificity for mediastinal pathology. Atherosclerosis in the thoracic aorta. Status post median sternotomy.  IMPRESSION: 1. Low lung volumes with probable bibasilar subsegmental atelectasis. 2. Atherosclerosis.   Electronically Signed   By: Trudie Reed M.D.   On: 09/03/2014 17:56   Dg Foot 2 Views Left  09/03/2014   CLINICAL DATA:  Infection left heel, pt sent from nursing home, unable to elaborate on pain or symptoms, nurse tech from home did not have any history information. Pt in fetal position, held for films.  EXAM: LEFT FOOT - 2 VIEW  COMPARISON:  None.  FINDINGS: No fracture dislocation.  No bone resorption is seen to indicate  osteomyelitis. No soft tissue air.  Osteoarthritic changes are noted most evident at the first metatarsophalangeal joint.  There are vascular calcifications along the dorsalis pedis and posterior tibial arteries.  Bones are demineralized.  IMPRESSION: 1. No fracture or acute bony abnormality. No evidence of osteomyelitis.   Electronically Signed   By: Amie Portland M.D.   On: 09/03/2014 17:55    Microbiology: Recent Results (from the past 240 hour(s))  Urine culture     Status: None   Collection Time: 09/03/14  6:29 PM  Result Value Ref Range Status   Specimen Description URINE, CATHETERIZED  Final   Special Requests NONE  Final   Colony Count NO GROWTH Performed at [facility]   Final   Culture NO GROWTH Performed at [facility]   Final   Report Status 09/05/2014 FINAL  Final  MRSA PCR Screening     Status: Abnormal   Collection Time: 09/03/14 11:00 PM  Result Value Ref Range Status   MRSA by PCR POSITIVE (A) NEGATIVE Final    Comment:        The GeneXpert MRSA Assay (FDA approved for NASAL specimens only), is one component of a comprehensive MRSA colonization surveillance program. It is not intended to diagnose MRSA infection nor to guide or monitor treatment for MRSA infections. RESULT CALLED TO, READ BACK BY AND VERIFIED WITH:  BREWER,D @ 0455 ON 09/04/14 BY WOODIE,J      Labs: Basic Metabolic Panel:  Recent Labs Lab 09/03/14 1714 09/04/14 0605 09/06/14 0510 09/06/14 1420 09/07/14 0520  NA 138 143 139  --  143  K 5.2* 4.9 3.1*  --  4.2  CL 107 115* 104  --  109  CO2  --  15* 30  --  27  GLUCOSE 121* 101* 89  --  70  BUN 67* 66* 34*  --  34*  CREATININE 3.40* 2.82* 1.10  --  0.94  CALCIUM  --  8.2* 7.7*  --  8.0*  MG  --   --   --  2.1  --    Liver Function Tests:  Recent Labs Lab 09/04/14 0605 09/05/14 0830  AST 190* 142*  ALT 194* 208*  ALKPHOS 78 78  BILITOT 0.8 0.6  PROT 5.4* 5.5*  ALBUMIN 2.3* 2.2*   No results for  input(s): LIPASE, AMYLASE in the last 168 hours. No results for input(s): AMMONIA in the last 168 hours. CBC:  Recent Labs Lab 09/03/14 1700 09/03/14 1714 09/03/14 2313 09/04/14 0605 09/05/14 0830  WBC 14.7*  --   --  15.4* 14.2*  NEUTROABS 13.1*  --   --   --   --   HGB 10.5* 11.6*  --  9.5* 9.1*  HCT 34.1* 34.0* 33.1* 30.9* 28.8*  MCV 81.0  --   --  81.1 78.9  PLT 344  --   --  297 296   Cardiac Enzymes:  Recent Labs Lab 09/03/14 2313 09/04/14 0200 09/04/14 0605 09/04/14 1246 09/05/14 0830  CKTOTAL  --  1958*  --   --  642*  CKMB  --  59.8*  --   --   --   TROPONINI 0.13*  --  0.17* 0.15*  --    BNP: BNP (last 3 results) No results for input(s): BNP in the last 8760 hours.  ProBNP (last 3 results) No results for input(s): PROBNP in the last 8760 hours.  CBG: No results for input(s): GLUCAP in the last 168 hours.     SignedChaya Jan  [facility] Pager: [phone] 09/07/2014, 12:14 PM

## 2014-09-07 NOTE — Progress Notes (Signed)
UR chart review completed.  

## 2014-09-07 NOTE — Clinical Social Work Note (Signed)
Pt d/c today back to [facility]. Pt oriented to self only per RN. CSW attempted to call all family on chart and left voicemail for sister. Spoke with Debbie at [facility] and notified her of d/c. She provided additional numbers for daughter, Dot Lanes. CSW called both number. Able to reach Krista's husband who reports she is at work and cannot be reached right now. CSW left number for Dot Lanes to call after work. Pt to transport via [facility]. D/C summary faxed.  Derenda Fennel, [place] [phone]

## 2014-09-07 NOTE — Clinical Social Work Note (Signed)
CSW left another voicemail for pt's daughter, Dot Lanes requesting return call.  Derenda Fennel, [place] [phone]

## 2014-09-08 LAB — UIFE/LIGHT CHAINS/TP QN, 24-HR UR
ALPHA 1 UR: DETECTED — AB
Albumin, U: DETECTED
Alpha 2, Urine: DETECTED — AB
Beta, Urine: DETECTED — AB
Gamma Globulin, Urine: DETECTED — AB
TIME-UPE24: 24 h
Total Protein, Urine-Ur/day: 559 mg/d — ABNORMAL HIGH (ref <150)
Total Protein, Urine: 43 mg/dL — ABNORMAL HIGH (ref 5–25)
Volume, Urine: 1300 mL

## 2014-09-09 ENCOUNTER — Other Ambulatory Visit: Payer: Self-pay

## 2014-09-09 ENCOUNTER — Encounter: Payer: Self-pay | Admitting: Nurse Practitioner

## 2014-09-09 ENCOUNTER — Ambulatory Visit (INDEPENDENT_AMBULATORY_CARE_PROVIDER_SITE_OTHER): Payer: Medicare Other | Admitting: Nurse Practitioner

## 2014-09-09 VITALS — BP 99/51 | HR 44 | Temp 96.6°F | Ht 66.0 in

## 2014-09-09 DIAGNOSIS — R1310 Dysphagia, unspecified: Secondary | ICD-10-CM | POA: Insufficient documentation

## 2014-09-09 DIAGNOSIS — E43 Unspecified severe protein-calorie malnutrition: Secondary | ICD-10-CM

## 2014-09-09 DIAGNOSIS — R1314 Dysphagia, pharyngoesophageal phase: Secondary | ICD-10-CM

## 2014-09-09 DIAGNOSIS — R630 Anorexia: Secondary | ICD-10-CM

## 2014-09-09 NOTE — Assessment & Plan Note (Signed)
States food occasionally gets stuck and he has to cough it back up. Minimal increased risk for dysphagia per SLP eval in hospital. Facility staff has not reported frequent coughing/choling with meals. Patient with poor po intake and protein calorie malnutrition and per Dr. Darrick Penna ok to proceed with PEG placement which will help resolve dysphagia issues (see malnutrition A&P).  Proceed with PEG placement with Dr. Darrick Penna in the near future. The risks, benefits, and alternatives have been discussed in detail with the patient. They state understanding and desire to proceed.

## 2014-09-09 NOTE — Assessment & Plan Note (Addendum)
Patient with facility documented protein calorie malnutrition with low albumin, prealbumin, and total protein. Like due to poor appetite and po intake. Has supplemental greek yogurt and protein drinks which he rarely accepts. Typically only eats about 25-50% of meals with frequent cues, encouragement, and lack of appetite. Dignificant weight loss 11% in <6 months per hospital nutrition consult. Discussed procedure with patient at length and states he wants to proceed with PEG placement "rather than stay the way I am." Patient with significant chronic illnesses include cariomyopathy with EF 20-25% per cardioloy, O2 dependent, renal disease. Recent admissions for duodenal peroration with cardiac arrest and exploratory laparotomy in September of last year. Another admission 07/2014 for acute renal failure. Discussed patient case with Dr. Fields who is ok with proceeding with PEG placement. Patient has polypharmacy but is older with low weight, ok to proceed in endoscopy with 6.25 IV phenergan on call; Abx on call Ancef 1gm pre-procedure.  Proceed with PEG placement with Dr. Fields in the near future. The risks, benefits, and alternatives have been discussed in detail with the patient. They state understanding and desire to proceed.   

## 2014-09-09 NOTE — Patient Instructions (Signed)
1. We will schedule your PEG tube placement for 09/17/13 2. Further recommendations will be given after the procedure is complete. 3. Continue to encourage po intake until procedure with supplements as ordered

## 2014-09-09 NOTE — Assessment & Plan Note (Addendum)
Patient with facility documented protein calorie malnutrition with low albumin, prealbumin, and total protein. Like due to poor appetite and po intake. Has supplemental greek yogurt and protein drinks which he rarely accepts. Typically only eats about 25-50% of meals with frequent cues, encouragement, and lack of appetite. Dignificant weight loss 11% in <6 months per hospital nutrition consult. Discussed procedure with patient at length and states he wants to proceed with PEG placement "rather than stay the way I am." Patient with significant chronic illnesses include cariomyopathy with EF 20-25% per cardioloy, O2 dependent, renal disease. Recent admissions for duodenal peroration with cardiac arrest and exploratory laparotomy in September of last year. Another admission 07/2014 for acute renal failure. Discussed patient case with Dr. Darrick Penna who is ok with proceeding with PEG placement. Patient has polypharmacy but is older with low weight, ok to proceed in endoscopy with 6.25 IV phenergan on call; Abx on call Ancef 1gm pre-procedure.  Proceed with PEG placement with Dr. Darrick Penna in the near future. The risks, benefits, and alternatives have been discussed in detail with the patient. They state understanding and desire to proceed.

## 2014-09-09 NOTE — Progress Notes (Signed)
Primary Care Physician:  Samuel Jester, DO Primary Gastroenterologist:  Dr. Darrick Penna  Chief Complaint  Patient presents with  . Weight Loss  . poor appetite  . peg tube placement    HPI:   81 year old male presents for evaluation of PEG placement due to weight loss and poor appetite. Resides at [facility]. Recent hospitalizations in 04/2014 for peritonitis with cardiac arrest and exploratory laparotomy; Again admitted on 09/03/14 for acute renal failure. Nutrition consult at that time notes conative defect and 11% weight loss in less than 6 months, requires multiple cues. Swallow eval done with concern for mild aspiration risk, slow oral prep, decreased posterior transit of mech soft foots and subsequent buccal pocketing. Patient is currently a DNR. He has a history of cardiomyopathy with last EF per cardiology 20-25% and overall poor prognosis, currently on Amiodarone.  Patient states he is very seldom hungry/decreased appetite. Admits occasional dysphagia problems, occasionally has coughing episode with eating. Per accompanying staff no coughing with eating but he just doesn't want to eat much. He also states a contributing factor is his inability to feed himself. Denies nausea/vomiting, abdominal pain. States he would like to have [place] consider feeding tube (PEG) placement. "I would rather that than to keep going like I'm going." Denies fever, chills, chest pain. Wears oxygen all the time and per staff desats quickly without oxygen. Per staff eats only 10-25% but with encouragement and prolonged feeding he will sometimes eat up to 50%. Facility with dx protein malnutrition with prealbumin 12 on 07/20/14. On 09/04/13 Albumin 2.3, total protein 5.4. Denies recent fever, chills, chest pain, worsening shortness of breath, palpitations. Denies any other upper or lower GI symptoms.  Past Medical History  Diagnosis Date  . Coronary atherosclerosis of native coronary artery     Multivessel  . Essential  hypertension, benign   . Mixed hyperlipidemia   . Erectile dysfunction   . Osteoarthritis   . Myocardial infarction     IMI 1987 and PLMI 1997  . Rheumatoid arthritis, adult   . DNR (do not resuscitate)   . Cardiac arrest 05/2014  . Ischemic cardiomyopathy     Past Surgical History  Procedure Laterality Date  . Coronary artery bypass graft  1987  . Carpal tunnel release    . Rotator cuff repair    . Neck surgery    . Esophagogastroduodenoscopy N/A 01/23/2013    large Mallory Weiss Tear-suspect rebleeding from that but fundic source still possible  . Esophagogastroduodenoscopy N/A 01/23/2013    Procedure: ESOPHAGOGASTRODUODENOSCOPY (EGD);  Surgeon: Shirley Friar, MD;  Location: [facility] ENDOSCOPY;  Service: Endoscopy;  Laterality: N/A;  . Laparotomy N/A 04/06/2014    Procedure: EXPLORATORY LAPAROTOMY, GRAHAM PLICATION;  Surgeon: Dalia Heading, MD;  Location: [facility] ORS;  Service: General;  Laterality: N/A;  . Central venous catheter insertion Left 04/06/2014    Procedure: INSERTION CENTRAL LINE ADULT;  Surgeon: Dalia Heading, MD;  Location: [facility] ORS;  Service: General;  Laterality: Left;    Current Outpatient Prescriptions  Medication Sig Dispense Refill  . acetaminophen (TYLENOL) 500 MG tablet Take 1,000 mg by mouth every 6 (six) hours as needed for mild pain or moderate pain.    Marland Kitchen acidophilus (RISAQUAD) CAPS capsule Take 1 capsule by mouth.    . Amino Acids-Protein Hydrolys (FEEDING SUPPLEMENT, PRO-STAT SUGAR FREE 64,) LIQD Take 30 mLs by mouth 2 (two) times daily.    Marland Kitchen amiodarone (PACERONE) 200 MG tablet Take 200 mg by mouth daily.    Marland Kitchen  AVODART 0.5 MG capsule Take 0.5 mg by mouth daily.     . Cholecalciferol (VITAMIN D) 2000 UNITS CAPS Take 1 capsule by mouth daily.    . citalopram (CELEXA) 10 MG tablet Take 2 tablets (20 mg total) by mouth daily. 60 tablet 0  . ezetimibe (ZETIA) 10 MG tablet Take 10 mg by mouth daily.      . ferrous sulfate 325 (65 FE) MG tablet Take 325 mg by mouth 2  (two) times daily with a meal.    . furosemide (LASIX) 20 MG tablet Take 1 tablet (20 mg total) by mouth daily. (Patient taking differently: Take 20 mg by mouth every other day. ) 30 tablet 6  . ipratropium-albuterol (DUONEB) 0.5-2.5 (3) MG/3ML SOLN Take 3 mLs by nebulization every 8 (eight) hours as needed (for congestion).    . magnesium oxide (MAG-OX) 400 (241.3 MG) MG tablet Take 1 tablet (400 mg total) by mouth daily. 30 tablet   . metoprolol tartrate (LOPRESSOR) 12.5 mg TABS tablet Take 0.5 tablets (12.5 mg total) by mouth 2 (two) times daily. (Patient taking differently: Take 6.25 mg by mouth 2 (two) times daily. )    . nitroGLYCERIN (NITROSTAT) 0.4 MG SL tablet Place 1 tablet (0.4 mg total) under the tongue every 5 (five) minutes as needed for chest pain. 25 tablet 6  . oxyCODONE (ROXICODONE) 15 MG immediate release tablet Take 1 tablet (15 mg total) by mouth every 4 (four) hours as needed for pain. 30 tablet 0  . pantoprazole (PROTONIX) 40 MG tablet Take 1 tablet (40 mg total) by mouth daily at 12 noon. (Patient taking differently: Take 40 mg by mouth 2 (two) times daily. )    . risperiDONE (RISPERDAL) 0.5 MG tablet Take 1 tablet (0.5 mg total) by mouth 2 (two) times daily. (Patient taking differently: Take 0.5 mg by mouth at bedtime. ) 60 tablet 0  . senna (SENOKOT) 8.6 MG TABS tablet Take 1 tablet by mouth at bedtime.    . simvastatin (ZOCOR) 20 MG tablet Take 20 mg by mouth at bedtime.     . tamsulosin (FLOMAX) 0.4 MG CAPS capsule Take 0.4 mg by mouth daily.    . Thiamine HCl (VITAMIN B-1) 250 MG tablet Take 250 mg by mouth daily.      . vitamin C (ASCORBIC ACID) 500 MG tablet Take 500 mg by mouth 2 (two) times daily.    Marland Kitchen zolpidem (AMBIEN) 10 MG tablet Take 5 mg by mouth at bedtime as needed for sleep.      No current facility-administered medications for this visit.    Allergies as of 09/09/2014  . (No Known Allergies)    Family History  Problem Relation Age of Onset  .  Coronary artery disease Father     Died age 72 with MI  . Diabetes type II Sister     History   Social History  . Marital Status: Married    Spouse Name: N/A    Number of Children: N/A  . Years of Education: N/A   Occupational History  . retired     former Tax adviser; former Office manager   Social History Main Topics  . Smoking status: Former Smoker -- 0.25 packs/day for 60 years    Types: Cigarettes    Quit date: 07/14/2011  . Smokeless tobacco: Never Used     Comment: stopped 05-29-13  . Alcohol Use: No     Comment: one beeer every 6 months  . Drug Use:  No  . Sexual Activity: Not on file   Other Topics Concern  . Not on file   Social History Narrative    Review of Systems: All negative except for HPI.  Physical Exam: BP 99/51 mmHg  Pulse 44  Temp(Src) 96.6 F (35.9 C)  Ht 5\' 6"  (1.676 m) General:   Alert and oriented. Slow to respond. Pleasant and cooperative. Appears cachetic and malnourished.  Head:  Normocephalic and atraumatic. Eyes:  Without icterus, sclera clear and conjunctiva pink.  Ears:  Normal auditory acuity. Mouth:  No deformity or lesions, oral mucosa pink. No OP edema. Neck:  Supple, without mass or thyromegaly. Lungs:  Clear to auscultation bilaterally. No wheezes, rales, or rhonchi. No distress.  Heart:  S1, S2 present, systolic murmur appreciated.  Abdomen:  Limited due to in a wheelchair. +BS, soft, non-tender and non-distended. No HSM noted. No guarding or rebound. No masses appreciated.  Rectal:  Deferred  Msk:  Symmetrical without gross deformities. Normal posture. Pulses:  Normal pulses noted. Extremities:  Without clubbing or edema. Neurologic:  Grossly normal neurologically. Skin:  Intact without significant lesions or rashes. Cervical Nodes:  No significant cervical adenopathy. Psych:  Alert and cooperative. Normal mood and affect.     09/09/2014 10:42 AM

## 2014-09-10 NOTE — Progress Notes (Signed)
cc'ed to pcp °

## 2014-09-16 ENCOUNTER — Telehealth: Payer: Self-pay

## 2014-09-16 NOTE — Telephone Encounter (Signed)
Called and spoke to pts nurse. Made her aware that pts procedure was moved to 1215 and to have pt arrive at 1115

## 2014-09-17 ENCOUNTER — Encounter (HOSPITAL_COMMUNITY): Payer: Self-pay

## 2014-09-17 ENCOUNTER — Encounter (HOSPITAL_COMMUNITY)
Admission: RE | Disposition: A | Discharge status: Skilled Nursing Facility | Payer: Self-pay | Source: Ambulatory Visit | Attending: Gastroenterology

## 2014-09-17 ENCOUNTER — Ambulatory Visit (HOSPITAL_COMMUNITY)
Admission: RE | Admit: 2014-09-17 | Discharge: 2014-09-17 | Disposition: A | Discharge status: Skilled Nursing Facility | Payer: Medicare Other | Source: Ambulatory Visit | Attending: Gastroenterology | Admitting: Gastroenterology

## 2014-09-17 ENCOUNTER — Telehealth: Payer: Self-pay | Admitting: Cardiology

## 2014-09-17 DIAGNOSIS — Z87891 Personal history of nicotine dependence: Secondary | ICD-10-CM | POA: Diagnosis not present

## 2014-09-17 DIAGNOSIS — R1314 Dysphagia, pharyngoesophageal phase: Secondary | ICD-10-CM | POA: Diagnosis not present

## 2014-09-17 DIAGNOSIS — M1990 Unspecified osteoarthritis, unspecified site: Secondary | ICD-10-CM | POA: Diagnosis not present

## 2014-09-17 DIAGNOSIS — I10 Essential (primary) hypertension: Secondary | ICD-10-CM | POA: Insufficient documentation

## 2014-09-17 DIAGNOSIS — I255 Ischemic cardiomyopathy: Secondary | ICD-10-CM | POA: Insufficient documentation

## 2014-09-17 DIAGNOSIS — K222 Esophageal obstruction: Secondary | ICD-10-CM | POA: Insufficient documentation

## 2014-09-17 DIAGNOSIS — E43 Unspecified severe protein-calorie malnutrition: Secondary | ICD-10-CM

## 2014-09-17 DIAGNOSIS — R1310 Dysphagia, unspecified: Secondary | ICD-10-CM | POA: Diagnosis present

## 2014-09-17 DIAGNOSIS — Z951 Presence of aortocoronary bypass graft: Secondary | ICD-10-CM | POA: Diagnosis not present

## 2014-09-17 DIAGNOSIS — I252 Old myocardial infarction: Secondary | ICD-10-CM | POA: Diagnosis not present

## 2014-09-17 DIAGNOSIS — E46 Unspecified protein-calorie malnutrition: Secondary | ICD-10-CM | POA: Insufficient documentation

## 2014-09-17 DIAGNOSIS — M069 Rheumatoid arthritis, unspecified: Secondary | ICD-10-CM | POA: Diagnosis not present

## 2014-09-17 DIAGNOSIS — K2970 Gastritis, unspecified, without bleeding: Secondary | ICD-10-CM | POA: Diagnosis not present

## 2014-09-17 DIAGNOSIS — I2510 Atherosclerotic heart disease of native coronary artery without angina pectoris: Secondary | ICD-10-CM | POA: Insufficient documentation

## 2014-09-17 DIAGNOSIS — E785 Hyperlipidemia, unspecified: Secondary | ICD-10-CM | POA: Diagnosis not present

## 2014-09-17 HISTORY — PX: ESOPHAGOGASTRODUODENOSCOPY: SHX5428

## 2014-09-17 HISTORY — PX: PEG PLACEMENT: SHX5437

## 2014-09-17 SURGERY — INSERTION, PEG TUBE
Anesthesia: Moderate Sedation

## 2014-09-17 MED ORDER — MIDAZOLAM HCL 5 MG/5ML IJ SOLN
INTRAMUSCULAR | Status: AC
  Filled 2014-09-17: qty 10

## 2014-09-17 MED ORDER — PROMETHAZINE HCL 25 MG/ML IJ SOLN
6.2500 mg | Freq: Once | INTRAMUSCULAR | Status: AC
  Administered 2014-09-17: 6.25 mg via INTRAVENOUS

## 2014-09-17 MED ORDER — PROMETHAZINE HCL 25 MG/ML IJ SOLN
INTRAMUSCULAR | Status: AC
  Filled 2014-09-17: qty 1

## 2014-09-17 MED ORDER — CEFAZOLIN (ANCEF) 1 G IV SOLR
INTRAVENOUS | Status: DC | PRN
  Administered 2014-09-17: 1 g via INTRAMUSCULAR

## 2014-09-17 MED ORDER — ATROPINE SULFATE 1 MG/ML IJ SOLN
INTRAMUSCULAR | Status: DC | PRN
  Administered 2014-09-17: 1 mg via INTRAVENOUS

## 2014-09-17 MED ORDER — CEFAZOLIN SODIUM 1-5 GM-% IV SOLN
INTRAVENOUS | Status: AC
  Filled 2014-09-17: qty 50

## 2014-09-17 MED ORDER — CEFAZOLIN SODIUM 1-5 GM-% IV SOLN: 1.0000 g | Freq: Once | INTRAVENOUS | Status: DC

## 2014-09-17 MED ORDER — ATROPINE SULFATE 1 MG/ML IJ SOLN
INTRAMUSCULAR | Status: AC
  Filled 2014-09-17: qty 1

## 2014-09-17 MED ORDER — LIDOCAINE VISCOUS 2 % MT SOLN
OROMUCOSAL | Status: AC
  Filled 2014-09-17: qty 15

## 2014-09-17 MED ORDER — MEPERIDINE HCL 100 MG/ML IJ SOLN
INTRAMUSCULAR | Status: AC
  Filled 2014-09-17: qty 2

## 2014-09-17 MED ORDER — BUTAMBEN-TETRACAINE-BENZOCAINE 2-2-14 % EX AERO
INHALATION_SPRAY | CUTANEOUS | Status: DC | PRN
  Administered 2014-09-17: 1 via TOPICAL

## 2014-09-17 MED ORDER — SODIUM CHLORIDE 0.9 % IJ SOLN
INTRAMUSCULAR | Status: AC
  Filled 2014-09-17: qty 3

## 2014-09-17 MED ORDER — MEPERIDINE HCL 100 MG/ML IJ SOLN
INTRAMUSCULAR | Status: DC | PRN
  Administered 2014-09-17: 25 mg via INTRAVENOUS

## 2014-09-17 MED ORDER — MIDAZOLAM HCL 5 MG/5ML IJ SOLN
INTRAMUSCULAR | Status: DC | PRN
  Administered 2014-09-17 (×2): 1 mg via INTRAVENOUS

## 2014-09-17 MED ORDER — LIDOCAINE 1 % OPTIME INJ - NO CHARGE
INTRAMUSCULAR | Status: DC | PRN
  Administered 2014-09-17: 2 mL via INTRADERMAL

## 2014-09-17 MED ORDER — SODIUM CHLORIDE 0.9 % IV SOLN
INTRAVENOUS | Status: DC
  Administered 2014-09-17: 12:00:00 via INTRAVENOUS

## 2014-09-17 MED ORDER — STERILE WATER FOR IRRIGATION IR SOLN
Status: DC | PRN
  Administered 2014-09-17: 12:00:00

## 2014-09-17 NOTE — H&P (Signed)
Primary Care Physician:  Samuel Jester, DO Primary Gastroenterologist:  Dr. Darrick Penna  Pre-Procedure History & Physical: HPI:  [name] is a 81 y.o. male here for weight loss/malnutrtion.  Past Medical History  Diagnosis Date  . Coronary atherosclerosis of native coronary artery     Multivessel  . Essential hypertension, benign   . Mixed hyperlipidemia   . Erectile dysfunction   . Osteoarthritis   . Myocardial infarction     IMI 1987 and PLMI 1997  . Rheumatoid arthritis, adult   . DNR (do not resuscitate)   . Cardiac arrest 05/2014  . Ischemic cardiomyopathy     Past Surgical History  Procedure Laterality Date  . Coronary artery bypass graft  1987  . Carpal tunnel release    . Rotator cuff repair    . Neck surgery    . Esophagogastroduodenoscopy N/A 01/23/2013    large Mallory Weiss Tear-suspect rebleeding from that but fundic source still possible  . Esophagogastroduodenoscopy N/A 01/23/2013    Procedure: ESOPHAGOGASTRODUODENOSCOPY (EGD);  Surgeon: Shirley Friar, MD;  Location: [facility] ENDOSCOPY;  Service: Endoscopy;  Laterality: N/A;  . Laparotomy N/A 04/06/2014    Procedure: EXPLORATORY LAPAROTOMY, GRAHAM PLICATION;  Surgeon: Dalia Heading, MD;  Location: [facility] ORS;  Service: General;  Laterality: N/A;  . Central venous catheter insertion Left 04/06/2014    Procedure: INSERTION CENTRAL LINE ADULT;  Surgeon: Dalia Heading, MD;  Location: [facility] ORS;  Service: General;  Laterality: Left;    Prior to Admission medications   Medication Sig Start Date End Date Taking? Authorizing Provider  acetaminophen (TYLENOL) 500 MG tablet Take 1,000 mg by mouth every 6 (six) hours as needed for mild pain or moderate pain.   Yes Historical Provider, MD  acidophilus (RISAQUAD) CAPS capsule Take 1 capsule by mouth.   Yes Historical Provider, MD  Amino Acids-Protein Hydrolys (FEEDING SUPPLEMENT, PRO-STAT SUGAR FREE 64,) LIQD Take 30 mLs by mouth 2 (two) times daily.   Yes Historical Provider, MD   Cholecalciferol (VITAMIN D) 2000 UNITS CAPS Take 1 capsule by mouth daily.   Yes Historical Provider, MD  ipratropium-albuterol (DUONEB) 0.5-2.5 (3) MG/3ML SOLN Take 3 mLs by nebulization every 8 (eight) hours as needed (for congestion).   Yes Historical Provider, MD  metoprolol tartrate (LOPRESSOR) 12.5 mg TABS tablet Take 0.5 tablets (12.5 mg total) by mouth 2 (two) times daily. Patient taking differently: Take 6.25 mg by mouth 2 (two) times daily.  04/13/14  Yes Rhetta Mura, MD  risperiDONE (RISPERDAL) 0.5 MG tablet Take 1 tablet (0.5 mg total) by mouth 2 (two) times daily. Patient taking differently: Take 0.5 mg by mouth at bedtime.  04/13/14  Yes Rhetta Mura, MD  senna (SENOKOT) 8.6 MG TABS tablet Take 1 tablet by mouth at bedtime.   Yes Historical Provider, MD  tamsulosin (FLOMAX) 0.4 MG CAPS capsule Take 0.4 mg by mouth daily.   Yes Historical Provider, MD  vitamin C (ASCORBIC ACID) 500 MG tablet Take 500 mg by mouth 2 (two) times daily.   Yes Historical Provider, MD  zolpidem (AMBIEN) 10 MG tablet Take 5 mg by mouth at bedtime as needed for sleep.    Yes Historical Provider, MD  amiodarone (PACERONE) 200 MG tablet Take 200 mg by mouth daily.    Historical Provider, MD  AVODART 0.5 MG capsule Take 0.5 mg by mouth daily.  06/19/13   Historical Provider, MD  citalopram (CELEXA) 10 MG tablet Take 2 tablets (20 mg total) by mouth daily. 04/13/14  Rhetta Mura, MD  ezetimibe (ZETIA) 10 MG tablet Take 10 mg by mouth daily.      Historical Provider, MD  ferrous sulfate 325 (65 FE) MG tablet Take 325 mg by mouth 2 (two) times daily with a meal.    Historical Provider, MD  furosemide (LASIX) 20 MG tablet Take 1 tablet (20 mg total) by mouth daily. Patient taking differently: Take 20 mg by mouth every other day.  01/05/13   Jonelle Sidle, MD  magnesium oxide (MAG-OX) 400 (241.3 MG) MG tablet Take 1 tablet (400 mg total) by mouth daily. 04/13/14   Rhetta Mura, MD   nitroGLYCERIN (NITROSTAT) 0.4 MG SL tablet Place 1 tablet (0.4 mg total) under the tongue every 5 (five) minutes as needed for chest pain. 06/29/13   Jonelle Sidle, MD  oxyCODONE (ROXICODONE) 15 MG immediate release tablet Take 1 tablet (15 mg total) by mouth every 4 (four) hours as needed for pain. 09/07/14   Henderson Cloud, MD  pantoprazole (PROTONIX) 40 MG tablet Take 1 tablet (40 mg total) by mouth daily at 12 noon. Patient taking differently: Take 40 mg by mouth 2 (two) times daily.  04/13/14   Rhetta Mura, MD  simvastatin (ZOCOR) 20 MG tablet Take 20 mg by mouth at bedtime.     Historical Provider, MD  Thiamine HCl (VITAMIN B-1) 250 MG tablet Take 250 mg by mouth daily.      Historical Provider, MD    Allergies as of 09/09/2014  . (No Known Allergies)    Family History  Problem Relation Age of Onset  . Coronary artery disease Father     Died age 12 with MI  . Diabetes type II Sister     History   Social History  . Marital Status: Married    Spouse Name: N/A  . Number of Children: N/A  . Years of Education: N/A   Occupational History  . retired     former Tax adviser; former Office manager   Social History Main Topics  . Smoking status: Former Smoker -- 0.25 packs/day for 60 years    Types: Cigarettes    Quit date: 07/14/2011  . Smokeless tobacco: Never Used     Comment: stopped 05-29-13  . Alcohol Use: No     Comment: one beeer every 6 months  . Drug Use: No  . Sexual Activity: Not on file   Other Topics Concern  . Not on file   Social History Narrative    Review of Systems: See HPI, otherwise negative ROS  Physical Exam: BP 127/41 mmHg  Pulse 80  Temp(Src) 97.6 F (36.4 C) (Oral)  Resp 18  SpO2 100% General:   in NAD Head:  Normocephalic and atraumatic. Neck:  Supple; Lungs:  Clear throughout to auscultation.    Heart:  Regular rate and rhythm. Abdomen:  Soft, nontender and nondistended. Normal bowel sounds, without  guarding, and without rebound.   Neurologic:  NO  NEW FOCAL DEFICITS   Impression/Plan:    MALnutrition  PLAN:  1.egd/peg today

## 2014-09-17 NOTE — Progress Notes (Signed)
Call to [facility]. Wheelchair and postoperative orders left at nursing station at [facility]. Spoke with Lorelee Market. RN who is sending a staff member to hospital to collect order sheet and wheelchair. Orders sealed in Envelope with Patient ID.

## 2014-09-17 NOTE — Discharge Instructions (Signed)
He has MILD GASTRITIS.  IF THE TUBE GETS PULLED OUT BEFORE 6 WEEKS HE WILL NEED SURGERY TO FIX THE GASTRIC DEFECT AND REPLACE THE PEG.    PT HAD A HEART RATE OF 41 IN ENDOSCOPY AND REQUIRED ATROPINE PRIOR TO THE ENDOSCOPY. DISCUSSED WITH DR, MCDOWELL. RECOMMENDED LOPRESSOR BE HELD. CONTINUE AMIODARONE.  CALL MY OFFICE, [phone],  WITH QUESTIONS REGARDING THE PEG.    UPPER ENDOSCOPY AFTER CARE Read the instructions outlined below and refer to this sheet in the next week. These discharge instructions provide you with general information on caring for yourself after you leave the hospital. While your treatment has been planned according to the most current medical practices available, unavoidable complications occasionally occur. If you have any problems or questions after discharge, call DR. Arianah Torgeson, [phone].  ACTIVITY  You may resume your regular activity, but move at a slower pace for the next 24 hours.   Take frequent rest periods for the next 24 hours.   Walking will help get rid of the air and reduce the bloated feeling in your belly (abdomen).   No driving for 24 hours (because of the medicine (anesthesia) used during the test).   You may shower.   Do not sign any important legal documents or operate any machinery for 24 hours (because of the anesthesia used during the test).    NUTRITION  Drink plenty of fluids.   You may resume your normal diet as instructed by your doctor.   Begin with a light meal and progress to your normal diet. Heavy or fried foods are harder to digest and may make you feel sick to your stomach (nauseated).   Avoid alcoholic beverages for 24 hours or as instructed.    MEDICATIONS  You may resume your normal medications.   WHAT YOU CAN EXPECT TODAY  Some feelings of bloating in the abdomen.   Passage of more gas than usual.    IF YOU HAD A BIOPSY TAKEN DURING THE UPPER ENDOSCOPY:  Eat a soft diet IF YOU HAVE NAUSEA, BLOATING,  ABDOMINAL PAIN, OR VOMITING.    FINDING OUT THE RESULTS OF YOUR TEST Not all test results are available during your visit. DR. Darrick Penna WILL CALL YOU WITHIN 7 DAYS OF YOUR PROCEDUE WITH YOUR RESULTS. Do not assume everything is normal if you have not heard from DR. Reid Nawrot IN ONE WEEK, CALL HER OFFICE AT [phone].  SEEK IMMEDIATE MEDICAL ATTENTION AND CALL THE OFFICE: [phone] IF:  You have more than a spotting of blood in your stool.   Your belly is swollen (abdominal distention).   You are nauseated or vomiting.   You have a temperature over 101F.   You have abdominal pain or discomfort that is severe or gets worse throughout the day.  Gastritis  Gastritis is an inflammation (the body's way of reacting to injury and/or infection) of the stomach. It is often caused by viral or bacterial (germ) infections. It can also be caused BY ASPIRIN, BC/GOODY POWDER'S, (IBUPROFEN) MOTRIN, OR ALEVE (NAPROXEN), chemicals (including alcohol), SPICY FOODS, and medications. This illness may be associated with generalized malaise (feeling tired, not well), UPPER ABDOMINAL STOMACH cramps, and fever. One common bacterial cause of gastritis is an organism known as H. Pylori. This can be treated with antibiotics.   Care of a Feeding Tube Site Individuals who have trouble swallowing or cannot take food or medication by mouth are sometimes given feeding tubes. A feeding tube can go into the nose and down to the  stomach or through the skin in the abdomen and into the stomach or small bowel. Some of the names of these feeding tubes are gastrostomy tubes, PEG lines, nasogastric tubes, and gastrojejunostomy tubes.  Liquids or foods that have been made into a thick, smooth soup consistency (pureed), along with medications, may be given through the tube. There are several ways to give the liquid food (formula), and there are several kinds of prescribed formulas. Your caregiver will arrange for you to get nutrition  in the right amounts for you. EQUIPMENT NEEDED FOR TUBE FEEDING  A 60 mL syringe.   Formula suggested by your caregiver or dietician.   A feeding pump or a place to hang your food container (an IV pole or a wall hook) so it can operate by gravity while you are getting your feedings. You attach the tube from the food container to the end of the feeding tube. Your caregivers will help you with these supplies or tell you where to get them.  PROCEDURE FOR TUBE FEEDING 1. Wash your hands before touching the equipment, food, medications, or the site (where the tube enters your body).  2. Check the tube placement before starting the feeding by removing the plug in the end of the feeding tube and attaching a syringe to the feeding tube. Pull the plunger back and you will usually see some yellowish or greenish fluid. This tells you the tube is in the correct place. Note the amount and gently push the residual back into the tube.  Ask your caregiver if there are instances when you would not start tube feedings depending on the amount or type of contents withdrawn from the stomach.   If gastrointestinal (GI) contents do not show when you pull back on the plunger, measure the length from the stoma site (the opening in your body made for feeding) to the end of your feeding tube. If this different from the previous measurement, you should call your caregiver.   If at any point the feeding tube seems blocked, use your syringe and pull back on the plunger. If this does not work, try to gently force some warm water through the tube. If none of these methods work, call your caregiver. It is important not to miss your medications, food, or water.  3. If everything with the tube seems to be okay, insert the tip of the tube from the food container into your feeding tube.  4. While sitting up or with your head propped up at least 30 degrees to 45 degrees, start the feeding. If you develop coughing or have trouble  breathing, stop the feeding immediately.  5. Administer the feeding for the length of time instructed by your caregiver.  6. To keep the tube open, following the feeding, flush the tube with 30 mLs of water using a syringe, and replace the plug at the end of the feeding tube.  7. Do not leave unused food out between feedings. Refrigerate or store it as directed.  GIVING YOUR MEDICATIONS THROUGH THE TUBE  Use liquid forms of your medications, if available.   Some pills or tablets may be crushed and put into warm water. Do not use hot water, which could affect the contents.   Ask the pharmacist if you can crush your pills. Do not crush capsules that have SR (sustained release), XR (extended release), or CD (controlled release) printed on them unless your caregiver or pharmacist says it is okay.   After you have crushed your  pills or capsules into small pieces or a powder, let the pieces dissolve in warm (not hot) water so that no pieces will clog your tube. Draw medication up into your syringe by pulling back on the plunger.   Attach the syringe to the end of the feeding tube and push on the plunger to give your medication.   Flush the tube with 30 mLs of water after giving your medication. This makes sure you have received all of your medications.  CARE OF THE SKIN AROUND THE ENTRANCE OF THE TUBE  Check the skin daily where the tube enters the abdomen for redness, irritation, drainage, or tenderness.   Clean around the tube daily with water or soap and water using cotton-tipped applicators or gauze squares. Dry completely after cleaning.   Change the dressing around the tube insertion site daily FOR 7 DAYS. THE TOP OF THE BUMPER IS AT 3.0 CM. THE SKIN iS AT 2.0 CM.   If the dressing becomes wet or soiled, change it as soon as convenient.   You may use tape to fasten your feeding tube to your skin for comfort or do as directed.  SEEK IMMEDIATE MEDICAL CARE IF:   You develop coughing or  have trouble breathing during a feeding. Stop the feeding and call your caregiver immediately.   You notice swelling, redness, drainage, or tenderness at the tube insertion site.   You develop pain,nausea, vomiting, diarrhea, constipation, or bleeding around the tube insertion site.   The tube seems plugged and you are unable to get water through the tube.   There is food leaking from around the tube.   The tube falls out.

## 2014-09-17 NOTE — Progress Notes (Signed)
   09/17/14 1333  Medical Necessity for Transport Certificate --- IF THIS TRANSPORT IS ROUND TRIP OR SCHEDULED AND REPEATED, A PHYSICIAN MUST COMPLETE THIS FORM  Transport to (Location) Other (Comment) ([facility])  Reason for Transport Discharge  Name of Transporting Agency [facility]  Round Trip Transport? No  Q1 Are ALL the following "true" for this patient?              1. Unable to get up from bed without assistance  AND            2. Unable to ambulate AND        3. Unable to sit in a chair, including a wheelchair. Yes  Q2 Could the patient be transported safely by other means of transportation (I.E., wheelchair van)? No  Reason for transport - patient condition Requires continuous oxygen  Q3 Reason based on Facility and/or Services Transport to nearest appropriate facility  Building surveyor (can be: Physician, RN, NP, PA, DP, CNS) Socrates Cahoon. Bing Plume, RN

## 2014-09-17 NOTE — Progress Notes (Signed)
Report call to [facility] nurse Myra Rude. Transported to [facility] via EMS

## 2014-09-17 NOTE — Op Note (Signed)
[facility] [address] [place], [address]   EGD WITH PEG PROCEDURE REPORT        EXAM DATE: 09/17/2014  PATIENT NAME:          [name], [name]          MR #: [id number] BIRTHDATE:       22-Dec-1932     VISIT #:     [phone] ATTENDING:     West Bali, MD     STATUS:     outpatient ASSISTANT:  INDICATIONS:  The patient is a 81 yr old male here for an EGD with PEG due to dysphagia. PROCEDURE PERFORMED:     EGD, diagnostic AND  with PEG placement  MEDICATIONS:     Promethazine (Phenergan) 12.5 mg IV, Demerol 25 mg IV, Versed 2 mg IV, and Atropine 1 mg IV TOPICAL ANESTHETIC:     Viscous Xylocaine CONSENT: The patient'S DAUGHTER UNDERSTANDS the risks and benefits of the procedure and understands that these risks include, but are not limited to: sedation, allergic reaction, infection, perforation and/or bleeding. Alternative means of evaluation and treatment include, among others: physical exam, x-rays, and/or surgical intervention. The FAMILY elects to proceed with this endoscopic procedure. DESCRIPTION OF PROCEDURE: Informed consent was verified, confirmed and timeout was successfully executed by the treatment team. The patient was anesthetized with topical anesthesia and the EG-[id number] ([id number]) endoscope was introduced through the mouth and advanced to the second portion of the duodenum.  The instrument was slowly withdrawn as the mucosa was fully examined. FINDINGS: ESOPHAGUS: A stricture was found at the gastroesophageal junction. The stenosis was traversable with the endoscope.   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  The stenosis was traversable with the endoscope.   STOMACH: Mild non-erosive gastritis (inflammation) was found in the gastric antrum.   DUODENUM: The duodenal mucosa showed no abnormalities in the bulb and second portion of the duodenum. The stomach was then inflated with air, and by a combination of transillumination and  manual palpation, the site for the gastrostomy tube placement was selected and marked on the anterior abdominal wall.  The skin of the anterior abdomen was surgically prepped and draped with sterile towels.  Utilizing strict sterile technique, the selected site was then anesthetized with 1% xylocaine by injection into the skin and subcutaneous tissue.  A 1 cm incision was made through the skin and subcutaneous tissue, and the needle/cannula assembly was then passed through the abdominal wall and through the anterior wall of the stomach, maintaining visualization with the endoscope.  A snare device previously placed through the instrument channel was then opened and placed around the cannula, the needle was removed, and the insertion wire was passed through the cannula and into the stomach lumen.  The snare was then loosened from the cannula, and repositioned to snare the insertion wire.  The snare was then pulled up to the endoscope distal tip, and the scope was then withdrawn bringing with it the snare and insertion wire.  The insertion wire was then released from the snare, and then loop-attached to the 34F BS Push PEG gastrostomy tube.  Using the "pull technique", the G-tube was then pulled into place by traction on the insertion wire at the abdominal wall end.        The G-tube insertion site was then cleansed once again, and the external bolster was placed over the tube to secure it to the abdominal wall.  THE SKIN IS AT 2 CM  AND THE BUMPER IS AT 3 CM. A sterile dressing was then applied, and the procedure terminated. The gastroscope was then slowly withdrawn and removed.    ADVERSE EVENT:     INITILA HEART RATE 44-PT GIVEN ATROPINE 1 MG IV  HR 50-75 DURING ENDOSCOPY. IMPRESSIONS:     1.  Stricture at the gastroesophageal junction 2.  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  MILD Non-erosive gastritis  RECOMMENDATIONS:     MAY USE PEG FOR FEEDS. ABDOMINAL BINDER TO PREVENT PEG  BEING DISPLACED.  CHECK SKIN Q2H. CONTINUE PROTONIX HOLD LOPRESSOR . CONTINUE AMIODARONE. DISCUSSED WITH DR. MCDOWELL.  REPEAT EXAM:    West Bali, MD eSigned:  West Bali, MD 09-24-2014 5:16 PM   cc:  CPT CODES: ICD CODES:  The ICD and CPT codes recommended by this software are interpretations from the data that the clinical staff has captured with the software.  The verification of the translation of this report to the ICD and CPT codes and modifiers is the sole responsibility of the health care institution and practicing physician where this report was generated.  [employer] will not be held responsible for the validity of the ICD and CPT codes included on this report.  AMA assumes no liability for data contained or not contained herein. CPT is a Publishing rights manager of the [employer].  PATIENT NAME:  [name], [name] MR#: [id number]

## 2014-09-17 NOTE — Progress Notes (Signed)
REVIEWED.  

## 2014-09-17 NOTE — Telephone Encounter (Signed)
Spoke with nurse Tresa Endo at [facility] faxed order to [phone]

## 2014-09-17 NOTE — Addendum Note (Signed)
Addended by: Marlyn Corporal A on: 09/17/2014 01:34 PM   Modules accepted: Orders, Medications

## 2014-09-17 NOTE — Telephone Encounter (Signed)
Notified [facility]  Faxed order

## 2014-09-17 NOTE — Telephone Encounter (Signed)
Received call from Dr. Darrick Penna regarding patient. He presented for elective PEG placement today, noted to have heart rate in the 40's. He was given Atropine with increase heart rate into the 70's by report and procedure completed. Review of recent vital signs finds bradycardia into the 40-50 range intermittently. Medications include Amiodarone 200 mg daily (history of VT arrest) and Lopressor 12.5 mg BID. Recommendation is to stop Lopressor and continue Amiodarone at current dose for now. Please update nursing home.  Jonelle Sidle, M.D., F.A.C.C.

## 2014-09-20 ENCOUNTER — Encounter (HOSPITAL_COMMUNITY): Payer: Self-pay | Admitting: Gastroenterology

## 2014-11-24 ENCOUNTER — Ambulatory Visit (HOSPITAL_COMMUNITY)
Admission: RE | Admit: 2014-11-24 | Discharge: 2014-11-24 | Disposition: A | Discharge status: Home / Self Care | Payer: Medicare Other | Source: Ambulatory Visit | Attending: Internal Medicine | Admitting: Internal Medicine

## 2014-11-24 DIAGNOSIS — J189 Pneumonia, unspecified organism: Secondary | ICD-10-CM | POA: Insufficient documentation

## 2014-11-24 MED ORDER — SODIUM CHLORIDE 0.9 % IJ SOLN: 10.0000 mL | Freq: Two times a day (BID) | INTRAMUSCULAR | Status: DC

## 2014-11-24 MED ORDER — SODIUM CHLORIDE 0.9 % IJ SOLN: 10.0000 mL | INTRAMUSCULAR | Status: DC | PRN

## 2014-11-24 NOTE — Discharge Instructions (Signed)
PICC Insertion, Care After  Refer to this sheet in the next few weeks. These instructions provide you with information on caring for yourself after your procedure. Your health care provider may also give you more specific instructions. Your treatment has been planned according to current medical practices, but problems sometimes occur. Call your health care provider if you have any problems or questions after your procedure.  WHAT TO EXPECT AFTER THE PROCEDURE  After your procedure, it is typical to have the following:   Mild discomfort at the insertion site. This should not last more than a day.  HOME CARE INSTRUCTIONS   Rest at home for the remainder of the day after the procedure.   You may bend your arm and move it freely. If your PICC is near or at the bend of your elbow, avoid activity with repeated motion at the elbow.   Avoid lifting heavy objects as instructed by your health care provider.   Avoid using a crutch with the arm on the same side as your PICC. You may need to use a walker.  Bandage Care   Keep your PICC bandage (dressing) clean and dry to prevent infection.   Ask your health care provider when you may shower. To keep the dressing dry, cover the PICC with plastic wrap and tape before showering. If the dressing does become wet, replace it right after the shower.   Do not soak in the bath, swim, or use hot tubs when you have a PICC.   Change the PICC dressing as instructed by your health care provider.   Change your PICC dressing if it becomes loose or wet.  General PICC Care   Check the PICC insertion site daily for leakage, redness, swelling, or pain.   Flush the PICC as directed by your health care provider. Let your health care provider know right away if the PICC is difficult to flush or does not flush. Do not use force to flush the PICC.   Do not use a syringe that is less than 10 mL to flush the PICC.   Never pull or tug on the PICC.   Avoid blood pressure checks on the arm  with the PICC.   Keep your PICC identification card with you at all times.   Do not take the PICC out yourself. Only a trained health care professional should remove the PICC.  SEEK MEDICAL CARE IF:   You have pain in your arm, ear, face, or teeth.   You have fever or chills.   You have drainage from the PICC insertion site.   You have redness or palpate a "cord" around the PICC insertion site.   You cannot flush the catheter.  SEEK IMMEDIATE MEDICAL CARE IF:   You have swelling in the arm in which the PICC is inserted.  Document Released: 05/13/2013 Document Revised: 07/28/2013 Document Reviewed: 05/13/2013  ExitCare Patient Information 2015 [employer]. This information is not intended to replace advice given to you by your health care provider. Make sure you discuss any questions you have with your health care provider.

## 2014-11-24 NOTE — Progress Notes (Signed)
Peripherally Inserted Central Catheter/Midline Placement  The IV Nurse has discussed with the patient and/or persons authorized to consent for the patient, the purpose of this procedure and the potential benefits and risks involved with this procedure.  The benefits include less needle sticks, lab draws from the catheter and patient may be discharged home with the catheter.  Risks include, but not limited to, infection, bleeding, blood clot (thrombus formation), and puncture of an artery; nerve damage and irregular heat beat.  Alternatives to this procedure were also discussed.  PICC/Midline Placement Documentation  PICC / Midline Single Lumen 11/24/14 PICC Right Basilic 45 cm 3 cm (Active)  Indication for Insertion or Continuance of Line Home intravenous therapies (PICC only) 11/24/2014 11:00 AM  Exposed Catheter (cm) 3 cm 11/24/2014 11:00 AM  Site Assessment Clean;Dry;Intact 11/24/2014 11:00 AM  Line Status Flushed;Saline locked;Blood return noted 11/24/2014 11:00 AM  Dressing Type Transparent 11/24/2014 11:00 AM  Dressing Status Clean;Dry;Intact 11/24/2014 11:00 AM  Line Care Connections checked and tightened 11/24/2014 11:00 AM  Dressing Intervention New dressing 11/24/2014 11:00 AM  Dressing Change Due 12/01/14 11/24/2014 11:00 AM       Daleen Squibb 11/24/2014, 11:48 AM

## 2015-02-09 ENCOUNTER — Telehealth: Payer: Self-pay

## 2015-02-09 NOTE — Telephone Encounter (Signed)
T/C from [facility], pt needs peg replacement. It is still working and he is able to get meds and feedings, but it has been shortened. [facility] is aware that Dr. Darrick Penna is off today and will be back in the office tomorrow and I will let them know.

## 2015-02-10 NOTE — Telephone Encounter (Signed)
PT NEEDS TO COME TO ENDO TUE JUL 12 FOR PEG CHANGE.

## 2015-02-11 ENCOUNTER — Other Ambulatory Visit: Payer: Self-pay

## 2015-02-11 DIAGNOSIS — Z1211 Encounter for screening for malignant neoplasm of colon: Secondary | ICD-10-CM

## 2015-02-11 NOTE — Telephone Encounter (Signed)
[name] at [facility] is aware pt is to be at [facility] ENDO at 12:00 noon on 02/15/2015 and he will be done at 12:30 PM. I am faxing instructions to [phone].

## 2015-02-11 NOTE — Telephone Encounter (Signed)
Gastroenterology Pre-Procedure Review  Request Date: Requesting Physician:  PATIENT REVIEW QUESTIONS: The patient responded to the following health history questions as indicated:    FOR PEG REPLACEMENT/ STILL WORKING BUT WORN  1. Diabetes Melitis: no 2. Joint replacements in the past 12 months: no 3. Major health problems in the past 3 months: no 4. Has an artificial valve or MVP: no 5. Has a defibrillator: no 6. Has been advised in past to take antibiotics in advance of a procedure like teeth cleaning: no    MEDICATIONS & ALLERGIES:    Patient reports the following regarding taking any blood thinners:   Plavix? no Aspirin? no Coumadin? no  Patient confirms/reports the following medications:  Current Outpatient Prescriptions  Medication Sig Dispense Refill  . acetaminophen (TYLENOL) 500 MG tablet Take 1,000 mg by mouth every 6 (six) hours as needed for mild pain or moderate pain.    Marland Kitchen amiodarone (PACERONE) 200 MG tablet Take 200 mg by mouth daily.    Marland Kitchen atorvastatin (LIPITOR) 10 MG tablet Take 10 mg by mouth at bedtime.    . Cholecalciferol (VITAMIN D) 2000 UNITS CAPS Take 1 capsule by mouth daily.    . citalopram (CELEXA) 10 MG tablet Take 2 tablets (20 mg total) by mouth daily. 60 tablet 0  . ezetimibe (ZETIA) 10 MG tablet Take 10 mg by mouth daily.      . ferrous sulfate 325 (65 FE) MG tablet Take 325 mg by mouth 2 (two) times daily with a meal.    . finasteride (PROSCAR) 5 MG tablet Take 5 mg by mouth daily.    . furosemide (LASIX) 20 MG tablet Take 1 tablet (20 mg total) by mouth daily. 30 tablet 6  . ipratropium-albuterol (DUONEB) 0.5-2.5 (3) MG/3ML SOLN Take 3 mLs by nebulization every 8 (eight) hours as needed (for congestion).    . magnesium oxide (MAG-OX) 400 (241.3 MG) MG tablet Take 1 tablet (400 mg total) by mouth daily. 30 tablet   . morphine (MS CONTIN) 15 MG 12 hr tablet Take 15 mg by mouth every 12 (twelve) hours.    Marland Kitchen oxyCODONE (ROXICODONE) 15 MG immediate  release tablet Take 1 tablet (15 mg total) by mouth every 4 (four) hours as needed for pain. 30 tablet 0  . pantoprazole (PROTONIX) 40 MG tablet Take 1 tablet (40 mg total) by mouth daily at 12 noon. (Patient taking differently: Take 40 mg by mouth daily. )    . risperiDONE (RISPERDAL) 0.5 MG tablet Take 1 tablet (0.5 mg total) by mouth 2 (two) times daily. (Patient taking differently: Take 0.5 mg by mouth at bedtime. ) 60 tablet 0  . senna (SENOKOT) 8.6 MG TABS tablet Take 1 tablet by mouth at bedtime.    . simvastatin (ZOCOR) 20 MG tablet Take 20 mg by mouth at bedtime.     . Thiamine HCl (VITAMIN B-1) 250 MG tablet Take 250 mg by mouth daily.      . vitamin C (ASCORBIC ACID) 500 MG tablet Take 500 mg by mouth 2 (two) times daily.    Marland Kitchen zolpidem (AMBIEN) 10 MG tablet Take 5 mg by mouth at bedtime as needed for sleep.     Marland Kitchen acidophilus (RISAQUAD) CAPS capsule Take 1 capsule by mouth.    . Amino Acids-Protein Hydrolys (FEEDING SUPPLEMENT, PRO-STAT SUGAR FREE 64,) LIQD Take 30 mLs by mouth 2 (two) times daily.    . AVODART 0.5 MG capsule Take 0.5 mg by mouth daily.     Marland Kitchen  nitroGLYCERIN (NITROSTAT) 0.4 MG SL tablet Place 1 tablet (0.4 mg total) under the tongue every 5 (five) minutes as needed for chest pain. (Patient not taking: Reported on 02/11/2015) 25 tablet 6  . tamsulosin (FLOMAX) 0.4 MG CAPS capsule Take 0.4 mg by mouth daily.     No current facility-administered medications for this visit.    Patient confirms/reports the following allergies:  No Known Allergies  No orders of the defined types were placed in this encounter.    AUTHORIZATION INFORMATION Primary Insurance:   ID #:  Group #:  Pre-Cert / Auth required:  Pre-Cert / Auth #:   Secondary Insurance:   ID #:  Group #:  Pre-Cert / Auth required:  Pre-Cert / Auth #:   SCHEDULE INFORMATION: Procedure has been scheduled as follows:  Date:   02/15/2015                Time:   Location:   This Gastroenterology Pre-Precedure  Review Form is being routed to the following provider(s): Jonette Eva, MD

## 2015-02-14 ENCOUNTER — Ambulatory Visit: Payer: Medicare Other | Admitting: Nurse Practitioner

## 2015-02-15 ENCOUNTER — Encounter (HOSPITAL_COMMUNITY)
Admission: RE | Disposition: A | Discharge status: Home / Self Care | Payer: Self-pay | Source: Ambulatory Visit | Attending: Gastroenterology

## 2015-02-15 ENCOUNTER — Ambulatory Visit (HOSPITAL_COMMUNITY)
Admission: RE | Admit: 2015-02-15 | Discharge: 2015-02-15 | Disposition: A | Discharge status: Home / Self Care | Payer: Medicare Other | Source: Ambulatory Visit | Attending: Gastroenterology | Admitting: Gastroenterology

## 2015-02-15 DIAGNOSIS — N529 Male erectile dysfunction, unspecified: Secondary | ICD-10-CM | POA: Insufficient documentation

## 2015-02-15 DIAGNOSIS — E782 Mixed hyperlipidemia: Secondary | ICD-10-CM | POA: Diagnosis not present

## 2015-02-15 DIAGNOSIS — Z87891 Personal history of nicotine dependence: Secondary | ICD-10-CM | POA: Diagnosis not present

## 2015-02-15 DIAGNOSIS — I10 Essential (primary) hypertension: Secondary | ICD-10-CM | POA: Insufficient documentation

## 2015-02-15 DIAGNOSIS — Z66 Do not resuscitate: Secondary | ICD-10-CM | POA: Insufficient documentation

## 2015-02-15 DIAGNOSIS — I255 Ischemic cardiomyopathy: Secondary | ICD-10-CM | POA: Insufficient documentation

## 2015-02-15 DIAGNOSIS — Z79899 Other long term (current) drug therapy: Secondary | ICD-10-CM | POA: Insufficient documentation

## 2015-02-15 DIAGNOSIS — R1310 Dysphagia, unspecified: Secondary | ICD-10-CM | POA: Diagnosis present

## 2015-02-15 DIAGNOSIS — I2510 Atherosclerotic heart disease of native coronary artery without angina pectoris: Secondary | ICD-10-CM | POA: Diagnosis not present

## 2015-02-15 DIAGNOSIS — E43 Unspecified severe protein-calorie malnutrition: Secondary | ICD-10-CM | POA: Diagnosis not present

## 2015-02-15 DIAGNOSIS — K9423 Gastrostomy malfunction: Secondary | ICD-10-CM | POA: Diagnosis not present

## 2015-02-15 DIAGNOSIS — I252 Old myocardial infarction: Secondary | ICD-10-CM | POA: Insufficient documentation

## 2015-02-15 DIAGNOSIS — M069 Rheumatoid arthritis, unspecified: Secondary | ICD-10-CM | POA: Insufficient documentation

## 2015-02-15 DIAGNOSIS — M1990 Unspecified osteoarthritis, unspecified site: Secondary | ICD-10-CM | POA: Diagnosis not present

## 2015-02-15 HISTORY — PX: PEG PLACEMENT: SHX5437

## 2015-02-15 SURGERY — REPLACEMENT, PEG TUBE, WITHOUT ENDOSCOPY
Anesthesia: LOCAL

## 2015-02-15 NOTE — Telephone Encounter (Signed)
REVIEWED. AGREE. NO ADDITIONAL RECOMMENDATIONS. 

## 2015-02-15 NOTE — Procedures (Signed)
  PROCEDURE TECHNIQUE:  PT PREPPED AND DRAPED. 20 fr BUMPER PEG REMOVED. BALLOON CHECKED. REPLACED WITH 20 FR BALLOON PEG. 6 CC STERILE WATER IN BALLOON. SKIN AT 2 CM. TOP OF BUMPER AT 4.5 CM. ALL PORTS FLUSHED AND ASPIRATED.  PLAN: 1. TUBE FEEDS/MEDS TODAY. 2. OPV PRN.

## 2015-02-15 NOTE — H&P (Signed)
Primary Care Physician:  Samuel Jester, DO Primary Gastroenterologist:  Dr. Darrick Penna  Pre-Procedure History & Physical: HPI:  [name] is a 81 y.o. male here for MALFUNCTIONING PEG.  Past Medical History  Diagnosis Date  . Coronary atherosclerosis of native coronary artery     Multivessel  . Essential hypertension, benign   . Mixed hyperlipidemia   . Erectile dysfunction   . Osteoarthritis   . Myocardial infarction     IMI 1987 and PLMI 1997  . Rheumatoid arthritis, adult   . DNR (do not resuscitate)   . Cardiac arrest 05/2014  . Ischemic cardiomyopathy     Past Surgical History  Procedure Laterality Date  . Coronary artery bypass graft  1987  . Carpal tunnel release    . Rotator cuff repair    . Neck surgery    . Esophagogastroduodenoscopy N/A 01/23/2013    large Mallory Weiss Tear-suspect rebleeding from that but fundic source still possible  . Esophagogastroduodenoscopy N/A 01/23/2013    Procedure: ESOPHAGOGASTRODUODENOSCOPY (EGD);  Surgeon: Shirley Friar, MD;  Location: [facility] ENDOSCOPY;  Service: Endoscopy;  Laterality: N/A;  . Laparotomy N/A 04/06/2014    Procedure: EXPLORATORY LAPAROTOMY, GRAHAM PLICATION;  Surgeon: Dalia Heading, MD;  Location: [facility] ORS;  Service: General;  Laterality: N/A;  . Central venous catheter insertion Left 04/06/2014    Procedure: INSERTION CENTRAL LINE ADULT;  Surgeon: Dalia Heading, MD;  Location: [facility] ORS;  Service: General;  Laterality: Left;  . Peg placement N/A 09/17/2014    Procedure: PERCUTANEOUS ENDOSCOPIC GASTROSTOMY (PEG) PLACEMENT;  Surgeon: West Bali, MD;  Location: [facility] ENDO SUITE;  Service: Endoscopy;  Laterality: N/A;  215 - moved to 12:15 - Ginger to notify pt  . Esophagogastroduodenoscopy N/A 09/17/2014    [id number] at the gastroesophageal junction and cricopharyngeus/mild non-erosive gastritis    Prior to Admission medications   Medication Sig Start Date End Date Taking? Authorizing Provider  acetaminophen  (TYLENOL) 500 MG tablet Take 1,000 mg by mouth every 6 (six) hours as needed for mild pain or moderate pain.   Yes Historical Provider, MD  albuterol (PROVENTIL) (2.5 MG/3ML) 0.083% nebulizer solution Take 2.5 mg by nebulization 2 (two) times daily.    Yes Historical Provider, MD  Amino Acids-Protein Hydrolys (FEEDING SUPPLEMENT, PRO-STAT SUGAR FREE 64,) LIQD Take 30 mLs by mouth 2 (two) times daily.   Yes Historical Provider, MD  amiodarone (PACERONE) 200 MG tablet Take 200 mg by mouth daily.   Yes Historical Provider, MD  atorvastatin (LIPITOR) 10 MG tablet Take 10 mg by mouth at bedtime.   Yes Historical Provider, MD  Cholecalciferol (VITAMIN D) 2000 UNITS CAPS Take 1 capsule by mouth daily.   Yes Historical Provider, MD  citalopram (CELEXA) 10 MG tablet Take 2 tablets (20 mg total) by mouth daily. Patient taking differently: Take 10 mg by mouth daily.  04/13/14  Yes Rhetta Mura, MD  ezetimibe (ZETIA) 10 MG tablet Take 10 mg by mouth daily.     Yes Historical Provider, MD  ferrous sulfate 325 (65 FE) MG tablet Take 325 mg by mouth 2 (two) times daily with a meal.   Yes Historical Provider, MD  finasteride (PROSCAR) 5 MG tablet Take 5 mg by mouth daily.   Yes Historical Provider, MD  furosemide (LASIX) 20 MG tablet Take 1 tablet (20 mg total) by mouth daily. 01/05/13  Yes Jonelle Sidle, MD  ipratropium-albuterol (DUONEB) 0.5-2.5 (3) MG/3ML SOLN Take 3 mLs by nebulization every 8 (eight) hours  as needed (for congestion).   Yes Historical Provider, MD  levalbuterol Pauline Aus) 0.63 MG/3ML nebulizer solution Take 0.63 mg by nebulization 2 (two) times daily.   Yes Historical Provider, MD  magnesium oxide (MAG-OX) 400 (241.3 MG) MG tablet Take 1 tablet (400 mg total) by mouth daily. 04/13/14  Yes Rhetta Mura, MD  morphine (MS CONTIN) 15 MG 12 hr tablet Take 15 mg by mouth every 12 (twelve) hours.   Yes Historical Provider, MD  nitroGLYCERIN (NITROSTAT) 0.4 MG SL tablet Place 1 tablet (0.4 mg  total) under the tongue every 5 (five) minutes as needed for chest pain. 06/29/13  Yes Jonelle Sidle, MD  oxyCODONE (ROXICODONE) 15 MG immediate release tablet Take 1 tablet (15 mg total) by mouth every 4 (four) hours as needed for pain. 09/07/14  Yes Henderson Cloud, MD  pantoprazole (PROTONIX) 40 MG tablet Take 1 tablet (40 mg total) by mouth daily at 12 noon. Patient taking differently: Take 40 mg by mouth daily.  04/13/14  Yes Rhetta Mura, MD  risperiDONE (RISPERDAL) 0.5 MG tablet Take 1 tablet (0.5 mg total) by mouth 2 (two) times daily. 04/13/14  Yes Rhetta Mura, MD  senna (SENOKOT) 8.6 MG TABS tablet Take 1 tablet by mouth at bedtime.   Yes Historical Provider, MD  simvastatin (ZOCOR) 20 MG tablet Take 20 mg by mouth at bedtime.    Yes Historical Provider, MD  Thiamine HCl (VITAMIN B-1) 250 MG tablet Take 250 mg by mouth daily.     Yes Historical Provider, MD  vitamin C (ASCORBIC ACID) 500 MG tablet Take 500 mg by mouth 2 (two) times daily.   Yes Historical Provider, MD  zolpidem (AMBIEN) 5 MG tablet Take 5 mg by mouth at bedtime as needed for sleep.   Yes Historical Provider, MD    Allergies as of 02/11/2015  . (No Known Allergies)    Family History  Problem Relation Age of Onset  . Coronary artery disease Father     Died age 26 with MI  . Diabetes type II Sister     History   Social History  . Marital Status: Married    Spouse Name: N/A  . Number of Children: N/A  . Years of Education: N/A   Occupational History  . retired     former Tax adviser; former Office manager   Social History Main Topics  . Smoking status: Former Smoker -- 0.25 packs/day for 60 years    Types: Cigarettes    Quit date: 07/14/2011  . Smokeless tobacco: Never Used     Comment: stopped 05-29-13  . Alcohol Use: No     Comment: one beeer every 6 months  . Drug Use: No  . Sexual Activity: Not on file   Other Topics Concern  . Not on file   Social History  Narrative    Review of Systems: See HPI, otherwise negative ROS   Physical Exam: BP 136/52 mmHg  Pulse 49  Temp(Src) 97.7 F (36.5 C) (Oral)  Resp 20  Ht 5\' 6"  (1.676 m)  Wt 106 lb (48.081 kg)  BMI 17.12 kg/m2  SpO2 96% General:   Alert,  pleasant and cooperative in NAD Head:  Normocephalic and atraumatic. Neck:  Supple; Lungs:  Clear throughout to auscultation.    Heart:  Regular rate and rhythm. Abdomen:  Soft, nontender and nondistended. Normal bowel sounds, without guarding, and without rebound.   Neurologic:  Alert and  oriented x4;  grossly normal neurologically.  Impression/Plan:    MALFUNCTIONING PEG  PLAN:  BEDSIDE PEG CHANGE

## 2015-02-17 ENCOUNTER — Encounter (HOSPITAL_COMMUNITY): Payer: Self-pay | Admitting: Gastroenterology

## 2015-07-10 ENCOUNTER — Encounter (HOSPITAL_COMMUNITY): Payer: Self-pay

## 2015-07-10 ENCOUNTER — Emergency Department (HOSPITAL_COMMUNITY)
Admission: EM | Admit: 2015-07-10 | Discharge: 2015-07-10 | Disposition: A | Discharge status: Home / Self Care | Payer: Medicare Other | Attending: Emergency Medicine | Admitting: Emergency Medicine

## 2015-07-10 DIAGNOSIS — E782 Mixed hyperlipidemia: Secondary | ICD-10-CM | POA: Insufficient documentation

## 2015-07-10 DIAGNOSIS — I252 Old myocardial infarction: Secondary | ICD-10-CM | POA: Diagnosis not present

## 2015-07-10 DIAGNOSIS — Z951 Presence of aortocoronary bypass graft: Secondary | ICD-10-CM | POA: Diagnosis not present

## 2015-07-10 DIAGNOSIS — F0390 Unspecified dementia without behavioral disturbance: Secondary | ICD-10-CM | POA: Insufficient documentation

## 2015-07-10 DIAGNOSIS — Z87891 Personal history of nicotine dependence: Secondary | ICD-10-CM | POA: Insufficient documentation

## 2015-07-10 DIAGNOSIS — Z87438 Personal history of other diseases of male genital organs: Secondary | ICD-10-CM | POA: Insufficient documentation

## 2015-07-10 DIAGNOSIS — M069 Rheumatoid arthritis, unspecified: Secondary | ICD-10-CM | POA: Diagnosis not present

## 2015-07-10 DIAGNOSIS — Z4659 Encounter for fitting and adjustment of other gastrointestinal appliance and device: Secondary | ICD-10-CM

## 2015-07-10 DIAGNOSIS — Z431 Encounter for attention to gastrostomy: Secondary | ICD-10-CM | POA: Insufficient documentation

## 2015-07-10 DIAGNOSIS — I10 Essential (primary) hypertension: Secondary | ICD-10-CM | POA: Diagnosis not present

## 2015-07-10 DIAGNOSIS — Z79899 Other long term (current) drug therapy: Secondary | ICD-10-CM | POA: Insufficient documentation

## 2015-07-10 DIAGNOSIS — I2510 Atherosclerotic heart disease of native coronary artery without angina pectoris: Secondary | ICD-10-CM | POA: Diagnosis not present

## 2015-07-10 DIAGNOSIS — M1990 Unspecified osteoarthritis, unspecified site: Secondary | ICD-10-CM | POA: Diagnosis not present

## 2015-07-10 NOTE — Discharge Instructions (Signed)
I attempted to replace the feeding tube, was unsuccessful. Patient will need to see a gastroenterologist tomorrow.

## 2015-07-10 NOTE — ED Notes (Signed)
Pt here from [facility] for replacement of feeding tube

## 2015-07-10 NOTE — ED Provider Notes (Signed)
CSN: [id number]     Arrival date & time 07/10/15  1435 History   First MD Initiated Contact with Patient 07/10/15 1506     Chief Complaint  Patient presents with  . feeding tube replacement     (Consider location/radiation/quality/duration/timing/severity/associated sxs/prior Treatment) HPI..... Level V caveat for dementia. Feeding tube was dislodged today. No other complaints. Patient is here for tube replacement.  Past Medical History  Diagnosis Date  . Coronary atherosclerosis of native coronary artery     Multivessel  . Essential hypertension, benign   . Mixed hyperlipidemia   . Erectile dysfunction   . Osteoarthritis   . Myocardial infarction (HCC)     IMI 1987 and PLMI 1997  . Rheumatoid arthritis, adult (HCC)   . DNR (do not resuscitate)   . Cardiac arrest (HCC) 05/2014  . Ischemic cardiomyopathy    Past Surgical History  Procedure Laterality Date  . Coronary artery bypass graft  1987  . Carpal tunnel release    . Rotator cuff repair    . Neck surgery    . Esophagogastroduodenoscopy N/A 01/23/2013    large Mallory Weiss Tear-suspect rebleeding from that but fundic source still possible  . Esophagogastroduodenoscopy N/A 01/23/2013    Procedure: ESOPHAGOGASTRODUODENOSCOPY (EGD);  Surgeon: Shirley Friar, MD;  Location: [facility] ENDOSCOPY;  Service: Endoscopy;  Laterality: N/A;  . Laparotomy N/A 04/06/2014    Procedure: EXPLORATORY LAPAROTOMY, GRAHAM PLICATION;  Surgeon: Dalia Heading, MD;  Location: [facility] ORS;  Service: General;  Laterality: N/A;  . Central venous catheter insertion Left 04/06/2014    Procedure: INSERTION CENTRAL LINE ADULT;  Surgeon: Dalia Heading, MD;  Location: [facility] ORS;  Service: General;  Laterality: Left;  . Peg placement N/A 09/17/2014    Procedure: PERCUTANEOUS ENDOSCOPIC GASTROSTOMY (PEG) PLACEMENT;  Surgeon: West Bali, MD;  Location: [facility] ENDO SUITE;  Service: Endoscopy;  Laterality: N/A;  215 - moved to 12:15 - Ginger to notify pt  .  Esophagogastroduodenoscopy N/A 09/17/2014    [id number] at the gastroesophageal junction and cricopharyngeus/mild non-erosive gastritis  . Peg placement N/A 02/15/2015    Procedure: PERCUTANEOUS ENDOSCOPIC GASTROSTOMY (PEG) REPLACEMENT;  Surgeon: West Bali, MD;  Location: [facility] ENDO SUITE;  Service: Endoscopy;  Laterality: N/A;   Family History  Problem Relation Age of Onset  . Coronary artery disease Father     Died age 62 with MI  . Diabetes type II Sister    Social History  Substance Use Topics  . Smoking status: Former Smoker -- 0.25 packs/day for 60 years    Types: Cigarettes    Quit date: 07/14/2011  . Smokeless tobacco: Never Used     Comment: stopped 05-29-13  . Alcohol Use: No     Comment: one beeer every 6 months    Review of Systems  Reason unable to perform ROS: Dementia.      Allergies  Review of patient's allergies indicates no known allergies.  Home Medications   Prior to Admission medications   Medication Sig Start Date End Date Taking? Authorizing Provider  Amino Acids-Protein Hydrolys (FEEDING SUPPLEMENT, PRO-STAT SUGAR FREE 64,) LIQD Take 30 mLs by mouth 2 (two) times daily.   Yes Historical Provider, MD  amiodarone (PACERONE) 200 MG tablet Take 200 mg by mouth daily.   Yes Historical Provider, MD  atorvastatin (LIPITOR) 10 MG tablet Take 10 mg by mouth at bedtime.   Yes Historical Provider, MD  bisacodyl (DULCOLAX) 10 MG suppository Place 10 mg rectally as needed for moderate  constipation.   Yes Historical Provider, MD  Cholecalciferol (VITAMIN D) 2000 UNITS CAPS Take 1 capsule by mouth daily.   Yes Historical Provider, MD  citalopram (CELEXA) 10 MG tablet Take 2 tablets (20 mg total) by mouth daily. Patient taking differently: Take 10 mg by mouth daily.  04/13/14  Yes Rhetta Mura, MD  ezetimibe (ZETIA) 10 MG tablet Take 10 mg by mouth daily.     Yes Historical Provider, MD  ferrous sulfate 325 (65 FE) MG tablet Take 325 mg by mouth 2 (two)  times daily with a meal.   Yes Historical Provider, MD  finasteride (PROSCAR) 5 MG tablet Take 5 mg by mouth daily.   Yes Historical Provider, MD  furosemide (LASIX) 20 MG tablet Take 1 tablet (20 mg total) by mouth daily. 01/05/13  Yes Jonelle Sidle, MD  levalbuterol Pauline Aus) 0.63 MG/3ML nebulizer solution Take 0.63 mg by nebulization 3 (three) times daily.    Yes Historical Provider, MD  magnesium oxide (MAG-OX) 400 (241.3 MG) MG tablet Take 1 tablet (400 mg total) by mouth daily. 04/13/14  Yes Rhetta Mura, MD  morphine (MS CONTIN) 15 MG 12 hr tablet Take 15 mg by mouth every 12 (twelve) hours.   Yes Historical Provider, MD  pantoprazole (PROTONIX) 40 MG tablet Take 1 tablet (40 mg total) by mouth daily at 12 noon. Patient taking differently: Take 40 mg by mouth daily.  04/13/14  Yes Rhetta Mura, MD  polyethylene glycol (MIRALAX / GLYCOLAX) packet Take 17 g by mouth daily.   Yes Historical Provider, MD  risperiDONE (RISPERDAL) 0.25 MG tablet Take 0.25 mg by mouth 2 (two) times daily.   Yes Historical Provider, MD  senna (SENOKOT) 8.6 MG TABS tablet Take 1 tablet by mouth at bedtime.   Yes Historical Provider, MD  Thiamine HCl (VITAMIN B-1) 250 MG tablet Take 250 mg by mouth daily.     Yes Historical Provider, MD  vitamin C (ASCORBIC ACID) 500 MG tablet Take 500 mg by mouth 2 (two) times daily.   Yes Historical Provider, MD  acetaminophen (TYLENOL) 500 MG tablet Take 1,000 mg by mouth every 6 (six) hours as needed for mild pain or moderate pain.    Historical Provider, MD  ipratropium-albuterol (DUONEB) 0.5-2.5 (3) MG/3ML SOLN Take 3 mLs by nebulization every 8 (eight) hours as needed (for congestion).    Historical Provider, MD  nitroGLYCERIN (NITROSTAT) 0.4 MG SL tablet Place 1 tablet (0.4 mg total) under the tongue every 5 (five) minutes as needed for chest pain. 06/29/13   Jonelle Sidle, MD  oxyCODONE (ROXICODONE) 15 MG immediate release tablet Take 1 tablet (15 mg total) by  mouth every 4 (four) hours as needed for pain. 09/07/14   Henderson Cloud, MD  risperiDONE (RISPERDAL) 0.5 MG tablet Take 1 tablet (0.5 mg total) by mouth 2 (two) times daily. 04/13/14   Rhetta Mura, MD  zolpidem (AMBIEN) 5 MG tablet Take 5 mg by mouth at bedtime as needed for sleep.    Historical Provider, MD   BP 116/60 mmHg  Pulse 52  Temp(Src) 97.8 F (36.6 C) (Oral)  Resp 14  Ht 5\' 6"  (1.676 m)  Wt 106 lb (48.081 kg)  BMI 17.12 kg/m2  SpO2 94% Physical Exam  Constitutional:  Pleasant, demented  HENT:  Head: Normocephalic and atraumatic.  Eyes: Conjunctivae and EOM are normal. Pupils are equal, round, and reactive to light.  Neck: Normal range of motion. Neck supple.  Cardiovascular: Normal rate and  regular rhythm.   Pulmonary/Chest: Effort normal and breath sounds normal.  Abdominal:  Ostomy site in mid abdomen.  Musculoskeletal: Normal range of motion.  Neurological: He is alert.  Skin: Skin is warm and dry.  Psychiatric:  Flat affect  Nursing note and vitals reviewed.   ED Course  Procedures (including critical care time) Labs Review Labs Reviewed - No data to display  Imaging Review No results found. I have personally reviewed and evaluated these images and lab results as part of my medical decision-making.   EKG Interpretation None      MDM   Final diagnoses:  Encounter for feeding tube placement    I attempted to replace the feeding tube without success. This is not a life-threatening condition. Will discharge home and follow-up with gastroenterology on Monday    Donnetta Hutching, MD 07/10/15 [phone]

## 2015-07-10 NOTE — ED Notes (Signed)
Dr. Adriana Simas unsuccessful in replacing GI tube. Nurse Hope at [facility] informed that patient will need to followup with GI tomorrow.

## 2015-07-10 NOTE — ED Notes (Signed)
Patient is resting comfortably. 

## 2015-07-12 ENCOUNTER — Telehealth: Payer: Self-pay

## 2015-07-12 NOTE — Telephone Encounter (Signed)
If okay with RMR could he put in new PEG tube.He has sometime available

## 2015-07-12 NOTE — Telephone Encounter (Signed)
[facility] called stating that the pt is needing PEG put back in. It has been out since 07/10/15 and nothing holding it open. He get feeding at night. He is able to eat small amounts or food and take medication but not to well. Please advise

## 2015-07-12 NOTE — Telephone Encounter (Signed)
SchEdule replacement peg tomorrow in [place] BY RADIOLOGY.

## 2015-07-12 NOTE — Telephone Encounter (Signed)
Routing back to [name]

## 2015-07-13 ENCOUNTER — Other Ambulatory Visit: Payer: Self-pay

## 2015-07-13 DIAGNOSIS — R1310 Dysphagia, unspecified: Secondary | ICD-10-CM

## 2015-07-13 NOTE — Telephone Encounter (Signed)
I spoke with RMR and we will proceed with calling Radiology.  I just spoke with Raynelle Fanning at  [phone] in IR in regards to placing a peg tube.  She stated they can try to put in a balloon retention today, however a G-tube can not be done today.  Please advise if you want to submit to a balloon retention today?  Also, we need to find out if the patient is on any blood thinners and who consents for him.

## 2015-07-13 NOTE — Telephone Encounter (Signed)
I spoke with [name] the patient will need an order placed for a G-tube and we will need to go through regular scheduling([phone]) to get this done.  Please place the order and call scheduling to get it done.

## 2015-07-13 NOTE — Telephone Encounter (Signed)
REVIEWED.  

## 2015-07-13 NOTE — Telephone Encounter (Signed)
Pt is set up for PEG placement on 07/14/15 with [facility]. The nursing home is aware and instructions have been faxed over to them.

## 2015-07-13 NOTE — Telephone Encounter (Signed)
And if the patient has had anything by mouth and if so, whatt time?

## 2015-07-13 NOTE — Telephone Encounter (Signed)
Talked with Tresa Endo (nurse for today). Pt is not on any blood thinners. He did have breakfast this morning which was eggs and sausage with coffee and juice. He is not able to sign for himself. His daughter Dot Lanes [phone]).

## 2015-07-14 ENCOUNTER — Encounter (HOSPITAL_COMMUNITY): Payer: Self-pay | Admitting: *Deleted

## 2015-07-14 ENCOUNTER — Encounter (HOSPITAL_COMMUNITY)
Admission: RE | Disposition: A | Discharge status: Home / Self Care | Payer: Self-pay | Source: Ambulatory Visit | Attending: Gastroenterology

## 2015-07-14 ENCOUNTER — Ambulatory Visit (HOSPITAL_COMMUNITY)
Admission: RE | Admit: 2015-07-14 | Discharge: 2015-07-14 | Disposition: A | Discharge status: Home / Self Care | Payer: Medicare Other | Source: Ambulatory Visit | Attending: Gastroenterology | Admitting: Gastroenterology

## 2015-07-14 DIAGNOSIS — Z79899 Other long term (current) drug therapy: Secondary | ICD-10-CM | POA: Insufficient documentation

## 2015-07-14 DIAGNOSIS — M1991 Primary osteoarthritis, unspecified site: Secondary | ICD-10-CM | POA: Insufficient documentation

## 2015-07-14 DIAGNOSIS — E46 Unspecified protein-calorie malnutrition: Secondary | ICD-10-CM | POA: Insufficient documentation

## 2015-07-14 DIAGNOSIS — Z951 Presence of aortocoronary bypass graft: Secondary | ICD-10-CM | POA: Diagnosis not present

## 2015-07-14 DIAGNOSIS — I2510 Atherosclerotic heart disease of native coronary artery without angina pectoris: Secondary | ICD-10-CM | POA: Insufficient documentation

## 2015-07-14 DIAGNOSIS — R633 Feeding difficulties: Secondary | ICD-10-CM | POA: Diagnosis not present

## 2015-07-14 DIAGNOSIS — I10 Essential (primary) hypertension: Secondary | ICD-10-CM | POA: Insufficient documentation

## 2015-07-14 DIAGNOSIS — E782 Mixed hyperlipidemia: Secondary | ICD-10-CM | POA: Insufficient documentation

## 2015-07-14 DIAGNOSIS — I252 Old myocardial infarction: Secondary | ICD-10-CM | POA: Insufficient documentation

## 2015-07-14 DIAGNOSIS — K2970 Gastritis, unspecified, without bleeding: Secondary | ICD-10-CM | POA: Diagnosis not present

## 2015-07-14 DIAGNOSIS — I255 Ischemic cardiomyopathy: Secondary | ICD-10-CM | POA: Diagnosis not present

## 2015-07-14 DIAGNOSIS — R1310 Dysphagia, unspecified: Secondary | ICD-10-CM | POA: Diagnosis present

## 2015-07-14 DIAGNOSIS — Z87891 Personal history of nicotine dependence: Secondary | ICD-10-CM | POA: Diagnosis not present

## 2015-07-14 HISTORY — PX: PEG PLACEMENT: SHX5437

## 2015-07-14 HISTORY — PX: ESOPHAGOGASTRODUODENOSCOPY: SHX5428

## 2015-07-14 SURGERY — INSERTION, PEG TUBE
Anesthesia: Moderate Sedation

## 2015-07-14 MED ORDER — SODIUM CHLORIDE 0.9 % IJ SOLN
INTRAMUSCULAR | Status: AC
  Filled 2015-07-14: qty 3

## 2015-07-14 MED ORDER — ATROPINE SULFATE 1 MG/ML IJ SOLN
INTRAMUSCULAR | Status: DC | PRN
  Administered 2015-07-14: 1 mg via INTRAVENOUS

## 2015-07-14 MED ORDER — LIDOCAINE VISCOUS 2 % MT SOLN
OROMUCOSAL | Status: DC | PRN
Start: 2015-07-14 — End: 2015-07-14
  Administered 2015-07-14: 6 mL via OROMUCOSAL

## 2015-07-14 MED ORDER — PROMETHAZINE HCL 25 MG/ML IJ SOLN
12.5000 mg | Freq: Once | INTRAMUSCULAR | Status: AC
  Administered 2015-07-14: 12.5 mg via INTRAVENOUS

## 2015-07-14 MED ORDER — AMIODARONE HCL 100 MG PO TABS: ORAL_TABLET | ORAL | Status: AC

## 2015-07-14 MED ORDER — ATROPINE SULFATE 1 MG/ML IJ SOLN
INTRAMUSCULAR | Status: DC
Start: 2015-07-14 — End: 2015-07-14
  Filled 2015-07-14: qty 1

## 2015-07-14 MED ORDER — SODIUM CHLORIDE 0.9 % IV SOLN
INTRAVENOUS | Status: DC
  Administered 2015-07-14: 1000 mL via INTRAVENOUS

## 2015-07-14 MED ORDER — MEPERIDINE HCL 100 MG/ML IJ SOLN
INTRAMUSCULAR | Status: AC
  Filled 2015-07-14: qty 2

## 2015-07-14 MED ORDER — MIDAZOLAM HCL 5 MG/5ML IJ SOLN
INTRAMUSCULAR | Status: AC
  Filled 2015-07-14: qty 10

## 2015-07-14 MED ORDER — MIDAZOLAM HCL 5 MG/5ML IJ SOLN
INTRAMUSCULAR | Status: DC | PRN
  Administered 2015-07-14: 2 mg via INTRAVENOUS

## 2015-07-14 MED ORDER — LIDOCAINE VISCOUS 2 % MT SOLN
OROMUCOSAL | Status: AC
  Filled 2015-07-14: qty 15

## 2015-07-14 MED ORDER — MEPERIDINE HCL 100 MG/ML IJ SOLN
INTRAMUSCULAR | Status: DC | PRN
  Administered 2015-07-14: 25 mg via INTRAVENOUS

## 2015-07-14 MED ORDER — CEFAZOLIN SODIUM-DEXTROSE 2-3 GM-% IV SOLR: 2.0000 g | Freq: Once | INTRAVENOUS | Status: DC

## 2015-07-14 MED ORDER — CEFAZOLIN SODIUM 1-5 GM-% IV SOLN
INTRAVENOUS | Status: AC
  Filled 2015-07-14: qty 50

## 2015-07-14 MED ORDER — SODIUM CHLORIDE 0.9 % IV SOLN
INTRAVENOUS | Status: DC | PRN
  Administered 2015-07-14: 13:00:00

## 2015-07-14 MED ORDER — PROMETHAZINE HCL 25 MG/ML IJ SOLN
INTRAMUSCULAR | Status: AC
  Filled 2015-07-14: qty 1

## 2015-07-14 NOTE — Op Note (Signed)
[facility] [place], [id number]   EGD WITH PEG PROCEDURE REPORT        EXAM DATE: 07/14/2015  PATIENT NAME:          Deshannon, Hinchliffe          MR #: [id number] BIRTHDATE:       [birth year]     VISIT #:     [id number] ATTENDING:     Danie Binder, MD     STATUS:     outpatient ASSISTANT: INDICATIONS:  The patient is a 81 yr old male here for an EGD with PEG due to [place]. PROCEDURE PERFORMED:     EGD with biopsy EGD with PEG placement  MEDICATIONS:     Atropine 1 mg IV, Promethazine (Phenergan) 12.5 mg IV, Demerol 25 mg IV, and Versed 2 mg IV TOPICAL ANESTHETIC:     Viscous Xylocaine  CONSENT: The patient understands the risks and benefits of the procedure and understands that these risks include, but are not limited to: sedation, allergic reaction, infection, perforation and/or bleeding. Alternative means of evaluation and treatment include, among others: physical exam, x-rays, and/or surgical intervention. The patient elects to proceed with this endoscopic procedure.  DESCRIPTION OF PROCEDURE: During intra-op preparation period all mechanical & medical equipment was checked for proper function. Hand hygiene and appropriate measures for infection prevention was taken. After the risks, benefits and alternatives of the procedure were thoroughly explained, Informed consent was verified, confirmed and timeout was successfully executed by the treatment team. The patient was anesthetized with topical anesthesia and the EG-[id number] ([id number]) endoscope was introduced through the mouth and advanced to the second portion of the duodenum.  The instrument was slowly withdrawn as the mucosa was fully examined. ESOPHAGUS: The mucosa of the esophagus appeared normal.   STOMACH: OLD PEG SITE IN MID BODY OF THE STOMACH.   Moderate non-erosive gastritis (inflammation) was found in the gastric body and gastric antrum.  Multiple biopsies were  performed using cold forceps. DUODENUM: The duodenal mucosa showed no abnormalities in the bulb and second portion of the duodenum. The stomach was then inflated with air, and by a combination of transillumination and manual palpation, the site for the gastrostomy tube placement was selected and marked on the anterior abdominal wall.  The skin of the anterior abdomen was surgically prepped and draped with sterile towels.  Utilizing strict sterile technique, the selected site was then anesthetized with 1% xylocaine by injection into the skin and subcutaneous tissue.  A 1 cm incision was made through the skin and subcutaneous tissue, and the needle/cannula assembly was then passed through the abdominal wall and through the anterior wall of the stomach, maintaining visualization with the endoscope.  A snare device previously placed through the instrument channel was then opened and placed around the cannula, the needle was removed, and the insertion wire was passed through the cannula and into the stomach lumen.  The snare was then loosened from the cannula, and repositioned to snare the insertion wire.  The snare was then pulled up to the endoscope distal tip, and the scope was then withdrawn bringing with it the snare and insertion wire.  The insertion wire was then released from the snare, and then loop-attached to the Safety PEG Kit 33fr gastrostomy tube.  Using the "pull technique", the G-tube was then pulled into place by traction on the insertion wire at the abdominal wall end.   SKIN AT 2 CM AND TOP OF  THE BUMPER AT 3 CM. The G-tube insertion site was then cleansed once again, and the external bolster was placed over the tube to secure it to the abdominal wall.  A sterile dressing was then applied, and the procedure terminated. Estimated blood loss is zero unless otherwise noted in this procedure report. The gastroscope was then slowly withdrawn and removed.    ADVERSE EVENT:      PT HAD HR 40 PRIOR TO STARTING EGD/PEG. ATROPINE 1 MG IV GIVEN. HR INCREASED TO 70-80. IMPRESSIONS:     1.  MODERATE GASTRITIS 2.  OLD PEG SITE IN MID BODY OF THE STOMACH  RECOMMENDATIONS:     USE PEG TODAY. RE-START TUBE FEEDS. DECREASE AMIODARONE TO 100 MG DAILY. FOLLOW UP WITH DR. MCDOWELL. DISCUSSED WITH DR. MCDOWELL. AWAIT BIOPSY RESULTS. REPEAT EXAM:   ___________________________________ Danie Binder, MD eSigned:  Danie Binder, MD 07/14/2015 4:06 PM   cc: Sindy Guadeloupe, M.D.  CPT CODES: ICD CODES:  The ICD and CPT codes recommended by this software are interpretations from the data that the clinical staff has captured with the software.  The verification of the translation of this report to the ICD and CPT codes and modifiers is the sole responsibility of the health care institution and practicing physician where this report was generated.  [employer] will not be held responsible for the validity of the ICD and CPT codes included on this report.  AMA assumes no liability for data contained or not contained herein. CPT is a Designer, television/film set of the [employer].  PATIENT NAME:  Kaydence, Baba MR#: [id number]

## 2015-07-14 NOTE — H&P (Signed)
Primary Care Physician:  Samuel Jester, DO Primary Gastroenterologist:  Dr. Darrick Penna  Pre-Procedure History & Physical: HPI:  [name] is a 81 y.o. male here for UNABLE TO MAINTAIN NUTRITION.  Past Medical History  Diagnosis Date  . Coronary atherosclerosis of native coronary artery     Multivessel  . Essential hypertension, benign   . Mixed hyperlipidemia   . Erectile dysfunction   . Osteoarthritis   . Myocardial infarction (HCC)     IMI 1987 and PLMI 1997  . Rheumatoid arthritis, adult (HCC)   . DNR (do not resuscitate)   . Cardiac arrest (HCC) 05/2014  . Ischemic cardiomyopathy     Past Surgical History  Procedure Laterality Date  . Coronary artery bypass graft  1987  . Carpal tunnel release    . Rotator cuff repair    . Neck surgery    . Esophagogastroduodenoscopy N/A 01/23/2013    large Mallory Weiss Tear-suspect rebleeding from that but fundic source still possible  . Esophagogastroduodenoscopy N/A 01/23/2013    Procedure: ESOPHAGOGASTRODUODENOSCOPY (EGD);  Surgeon: Shirley Friar, MD;  [facility] END[facility]COPY;  Service: Endoscopy;  Laterality: N/A;  . Laparotomy N/A 04/06/2014    Procedure: EXPLORATORY LAPAROTOMY, GRAHAM PLICATION;  Surgeon: Dalia Heading, MD;  Location: [facility] ORS;  Service: General;  Laterality: N/A;  . Central venous catheter insertion Left 04/06/2014    Procedure: INSERTION CENTRAL LINE ADULT;  Surgeon: Dalia Heading, MD;  Location: [facility] ORS;  Service: General;  Laterality: Left;  . Peg placement N/A 09/17/2014    Procedure: PERCUTANEOUS ENDOSCOPIC GASTROSTOMY (PEG) PLACEMENT;  Surgeon: West Bali, MD;  Location: [facility] ENDO SUITE;  Service: Endoscopy;  Laterality: N/A;  215 - moved to 12:15 - Ginger to notify pt  . Esophagogastroduodenoscopy N/A 09/17/2014    OFB:PZWCHENID at the gastroesophageal junction and cricopharyngeus/mild non-erosive gastritis  . Peg placement N/A 02/15/2015    Procedure: PERCUTANEOUS ENDOSCOPIC GASTROSTOMY (PEG) REPLACEMENT;   Surgeon: West Bali, MD;  Location: [facility] ENDO SUITE;  Service: Endoscopy;  Laterality: N/A;    Prior to Admission medications   Medication Sig Start Date End Date Taking? Authorizing Provider  acetaminophen (TYLENOL) 500 MG tablet Take 1,000 mg by mouth every 6 (six) hours as needed for mild pain or moderate pain.    Historical Provider, MD  Amino Acids-Protein Hydrolys (FEEDING SUPPLEMENT, PRO-STAT SUGAR FREE 64,) LIQD Take 30 mLs by mouth 2 (two) times daily.    Historical Provider, MD  amiodarone (PACERONE) 200 MG tablet Take 200 mg by mouth daily.    Historical Provider, MD  atorvastatin (LIPITOR) 10 MG tablet Take 10 mg by mouth at bedtime.    Historical Provider, MD  bisacodyl (DULCOLAX) 10 MG suppository Place 10 mg rectally as needed for moderate constipation.    Historical Provider, MD  Cholecalciferol (VITAMIN D) 2000 UNITS CAPS Take 1 capsule by mouth daily.    Historical Provider, MD  citalopram (CELEXA) 10 MG tablet Take 2 tablets (20 mg total) by mouth daily. Patient taking differently: Take 10 mg by mouth daily.  04/13/14   Rhetta Mura, MD  ezetimibe (ZETIA) 10 MG tablet Take 10 mg by mouth daily.      Historical Provider, MD  ferrous sulfate 325 (65 FE) MG tablet Take 325 mg by mouth 2 (two) times daily with a meal.    Historical Provider, MD  finasteride (PROSCAR) 5 MG tablet Take 5 mg by mouth daily.    Historical Provider, MD  furosemide (LASIX) 20  MG tablet Take 1 tablet (20 mg total) by mouth daily. 01/05/13   Jonelle Sidle, MD  ipratropium-albuterol (DUONEB) 0.5-2.5 (3) MG/3ML SOLN Take 3 mLs by nebulization every 8 (eight) hours as needed (for congestion).    Historical Provider, MD  levalbuterol Pauline Aus) 0.63 MG/3ML nebulizer solution Take 0.63 mg by nebulization 3 (three) times daily.     Historical Provider, MD  magnesium oxide (MAG-OX) 400 (241.3 MG) MG tablet Take 1 tablet (400 mg total) by mouth daily. 04/13/14   Rhetta Mura, MD  morphine (MS CONTIN)  15 MG 12 hr tablet Take 15 mg by mouth every 12 (twelve) hours.    Historical Provider, MD  nitroGLYCERIN (NITROSTAT) 0.4 MG SL tablet Place 1 tablet (0.4 mg total) under the tongue every 5 (five) minutes as needed for chest pain. 06/29/13   Jonelle Sidle, MD  oxyCODONE (ROXICODONE) 15 MG immediate release tablet Take 1 tablet (15 mg total) by mouth every 4 (four) hours as needed for pain. 09/07/14   Henderson Cloud, MD  pantoprazole (PROTONIX) 40 MG tablet Take 1 tablet (40 mg total) by mouth daily at 12 noon. Patient taking differently: Take 40 mg by mouth daily.  04/13/14   Rhetta Mura, MD  polyethylene glycol (MIRALAX / GLYCOLAX) packet Take 17 g by mouth daily.    Historical Provider, MD  risperiDONE (RISPERDAL) 0.25 MG tablet Take 0.25 mg by mouth 2 (two) times daily.    Historical Provider, MD  risperiDONE (RISPERDAL) 0.5 MG tablet Take 1 tablet (0.5 mg total) by mouth 2 (two) times daily. 04/13/14   Rhetta Mura, MD  senna (SENOKOT) 8.6 MG TABS tablet Take 1 tablet by mouth at bedtime.    Historical Provider, MD  Thiamine HCl (VITAMIN B-1) 250 MG tablet Take 250 mg by mouth daily.      Historical Provider, MD  vitamin C (ASCORBIC ACID) 500 MG tablet Take 500 mg by mouth 2 (two) times daily.    Historical Provider, MD  zolpidem (AMBIEN) 5 MG tablet Take 5 mg by mouth at bedtime as needed for sleep.    Historical Provider, MD    Allergies as of 07/13/2015  . (No Known Allergies)    Family History  Problem Relation Age of Onset  . Coronary artery disease Father     Died age 48 with MI  . Diabetes type II Sister     Social History   Social History  . Marital Status: Married    Spouse Name: N/A  . Number of Children: N/A  . Years of Education: N/A   Occupational History  . retired     former Tax adviser; former Office manager   Social History Main Topics  . Smoking status: Former Smoker -- 0.25 packs/day for 60 years    Types: Cigarettes    Quit  date: 07/14/2011  . Smokeless tobacco: Never Used     Comment: stopped 05-29-13  . Alcohol Use: No     Comment: one beeer every 6 months  . Drug Use: No  . Sexual Activity: Not on file   Other Topics Concern  . Not on file   Social History Narrative    Review of Systems: See HPI, otherwise negative ROS   Physical Exam: There were no vitals taken for this visit. General:   Alert,  pleasant and cooperative in NAD Head:  Normocephalic and atraumatic. Neck:  Supple; Lungs:  Clear throughout to auscultation.    Heart:  Regular rate and  rhythm. Abdomen:  Soft, nontender and nondistended. Normal bowel sounds, without guarding, and without rebound.   Neurologic:  Alert and  oriented x4;  grossly normal neurologically.  Impression/Plan:    PEG FELL OUT  PLAN:  1. CHANGE PEG VIA EGD TODAY. PHENERGAN 12.5 MG IV IN PREOP.

## 2015-07-14 NOTE — Discharge Instructions (Signed)
Re-start TUBE FEEDS.  REDUCE AMIODARONE TO 100 MG DAILY. FOLLOW UP WITH DR. MCDOWELL.  YOUR BIOPSY RESULTS WILL BE AVAILABLE IN 10-14 DAYS AND MY OFFICE WILL CONTACT YOU IN 10-14 DAYS WITH YOUR RESULTS.    UPPER ENDOSCOPY AFTER CARE Read the instructions outlined below and refer to this sheet in the next week. These discharge instructions provide you with general information on caring for yourself after you leave the hospital. While your treatment has been planned according to the most current medical practices available, unavoidable complications occasionally occur. If you have any problems or questions after discharge, call DR. Hughey Rittenberry, [phone].  ACTIVITY  You may resume your regular activity, but move at a slower pace for the next 24 hours.   Take frequent rest periods for the next 24 hours.   Walking will help get rid of the air and reduce the bloated feeling in your belly (abdomen).   No driving for 24 hours (because of the medicine (anesthesia) used during the test).   You may shower.   Do not sign any important legal documents or operate any machinery for 24 hours (because of the anesthesia used during the test).    NUTRITION  Drink plenty of fluids.   You may resume your normal diet as instructed by your doctor.   Begin with a light meal and progress to your normal diet. Heavy or fried foods are harder to digest and may make you feel sick to your stomach (nauseated).   Avoid alcoholic beverages for 24 hours or as instructed.    MEDICATIONS  You may resume your normal medications.   WHAT YOU CAN EXPECT TODAY  Some feelings of bloating in the abdomen.   Passage of more gas than usual.    IF YOU HAD A BIOPSY TAKEN DURING THE UPPER ENDOSCOPY:  Eat a soft diet IF YOU HAVE NAUSEA, BLOATING, ABDOMINAL PAIN, OR VOMITING.    FINDING OUT THE RESULTS OF YOUR TEST Not all test results are available during your visit. DR. Darrick Penna WILL CALL YOU WITHIN 14 DAYS OF  YOUR PROCEDUE WITH YOUR RESULTS. Do not assume everything is normal if you have not heard from DR. Zaeda Mcferran, CALL HER OFFICE AT [phone].  SEEK IMMEDIATE MEDICAL ATTENTION AND CALL THE OFFICE: [phone] IF:  You have more than a spotting of blood in your stool.   Your belly is swollen (abdominal distention).   You are nauseated or vomiting.   You have a temperature over 101F.   You have abdominal pain or discomfort that is severe or gets worse throughout the day.   Gastritis  Gastritis is an inflammation (the body's way of reacting to injury and/or infection) of the stomach. It is often caused by viral or bacterial (germ) infections. It can also be caused BY ASPIRIN, BC/GOODY POWDER'S, (IBUPROFEN) MOTRIN, OR ALEVE (NAPROXEN), chemicals (including alcohol), SPICY FOODS, and medications. This illness may be associated with generalized malaise (feeling tired, not well), UPPER ABDOMINAL STOMACH cramps, and fever. One common bacterial cause of gastritis is an organism known as H. Pylori. This can be treated with antibiotics.

## 2015-07-21 ENCOUNTER — Encounter: Payer: Self-pay | Admitting: Cardiology

## 2015-07-21 ENCOUNTER — Ambulatory Visit (INDEPENDENT_AMBULATORY_CARE_PROVIDER_SITE_OTHER): Payer: Medicare Other | Admitting: Cardiology

## 2015-07-21 VITALS — BP 110/70 | HR 54 | Wt 118.0 lb

## 2015-07-21 DIAGNOSIS — I10 Essential (primary) hypertension: Secondary | ICD-10-CM | POA: Diagnosis not present

## 2015-07-21 DIAGNOSIS — R001 Bradycardia, unspecified: Secondary | ICD-10-CM

## 2015-07-21 DIAGNOSIS — I255 Ischemic cardiomyopathy: Secondary | ICD-10-CM | POA: Diagnosis not present

## 2015-07-21 NOTE — Progress Notes (Signed)
Cardiology Office Note  Date: 07/21/2015   ID: [name], DOB March 18, 1933, MRN [id number]  PCP: Steve Kallenbach Jester, DO  Primary Cardiologist: Steve Dell, MD   Chief Complaint  Patient presents with  . Coronary Artery Disease  . Cardiomyopathy  . Bradycardia    History of Present Illness: [name] is an 81 y.o. male last seen in October 2015. He continues to reside at [facility], very limited functional capacity, and with DNR status. Just recently he was evaluated by Dr. Darrick Walker for replacement of a PEG tube. He was noted to be bradycardic and was given a prophylactic dose of atropine for the procedure. Heart rate was reported to be in the 40s initially, increased to the 70s to 80s. Dr. Darrick Walker did communicate with me about this and I recommended decreasing amiodarone to 100 mg daily for the time being.  [name] has a history of ischemic cardiomyopathy and has preferred a conservative approach overall. He is on amiodarone with a prior history of cardiac arrest.  Today in clinic he does not voice any specific complaints, but is a limited historian. ECG shows an ectopic atrial bradycardia at 53 bpm with old anterior infarct pattern and nonspecific ST changes.  I reviewed his medications which are outlined below.  Past Medical History  Diagnosis Date  . Coronary atherosclerosis of native coronary artery     Multivessel  . Essential hypertension, benign   . Mixed hyperlipidemia   . Erectile dysfunction   . Osteoarthritis   . Myocardial infarction (HCC)     IMI 1987 and PLMI 1997  . Rheumatoid arthritis, adult (HCC)   . DNR (do not resuscitate)   . Cardiac arrest (HCC) 05/2014  . Ischemic cardiomyopathy     Current Outpatient Prescriptions  Medication Sig Dispense Refill  . acetaminophen (TYLENOL) 500 MG tablet Take 1,000 mg by mouth every 6 (six) hours as needed for mild pain or moderate pain.    . Amino Acids-Protein Hydrolys (FEEDING SUPPLEMENT,  PRO-STAT SUGAR FREE 64,) LIQD Take 30 mLs by mouth 2 (two) times daily.    Marland Kitchen amiodarone (PACERONE) 100 MG tablet 1 po daily 30 tablet 11  . atorvastatin (LIPITOR) 10 MG tablet Take 10 mg by mouth at bedtime.    . bisacodyl (DULCOLAX) 10 MG suppository Place 10 mg rectally as needed for moderate constipation.    . Cholecalciferol (VITAMIN D) 2000 UNITS CAPS Take 1 capsule by mouth daily.    . citalopram (CELEXA) 10 MG tablet Take 2 tablets (20 mg total) by mouth daily. (Patient taking differently: Take 10 mg by mouth daily. ) 60 tablet 0  . ezetimibe (ZETIA) 10 MG tablet Take 10 mg by mouth daily.      . ferrous sulfate 325 (65 FE) MG tablet Take 325 mg by mouth 2 (two) times daily with a meal.    . finasteride (PROSCAR) 5 MG tablet Take 5 mg by mouth daily.    . furosemide (LASIX) 20 MG tablet Take 1 tablet (20 mg total) by mouth daily. 30 tablet 6  . ipratropium-albuterol (DUONEB) 0.5-2.5 (3) MG/3ML SOLN Take 3 mLs by nebulization every 8 (eight) hours as needed (for congestion).    Marland Kitchen levalbuterol (XOPENEX) 0.63 MG/3ML nebulizer solution Take 0.63 mg by nebulization 3 (three) times daily.     . magnesium oxide (MAG-OX) 400 (241.3 MG) MG tablet Take 1 tablet (400 mg total) by mouth daily. 30 tablet   . morphine (MS CONTIN) 15 MG  12 hr tablet Take 15 mg by mouth every 12 (twelve) hours.    . nitroGLYCERIN (NITROSTAT) 0.4 MG SL tablet Place 1 tablet (0.4 mg total) under the tongue every 5 (five) minutes as needed for chest pain. 25 tablet 6  . oxyCODONE (ROXICODONE) 15 MG immediate release tablet Take 1 tablet (15 mg total) by mouth every 4 (four) hours as needed for pain. 30 tablet 0  . pantoprazole (PROTONIX) 40 MG tablet Take 1 tablet (40 mg total) by mouth daily at 12 noon. (Patient taking differently: Take 40 mg by mouth daily. )    . polyethylene glycol (MIRALAX / GLYCOLAX) packet Take 17 g by mouth daily.    . risperiDONE (RISPERDAL) 0.25 MG tablet Take 0.25 mg by mouth 2 (two) times daily.      Marland Kitchen senna (SENOKOT) 8.6 MG TABS tablet Take 1 tablet by mouth at bedtime.    . Thiamine HCl (VITAMIN B-1) 250 MG tablet Take 250 mg by mouth daily.      . vitamin C (ASCORBIC ACID) 500 MG tablet Take 500 mg by mouth 2 (two) times daily.    Marland Kitchen zolpidem (AMBIEN) 5 MG tablet Take 5 mg by mouth at bedtime as needed for sleep.     No current facility-administered medications for this visit.   Allergies:  Review of patient's allergies indicates no known allergies.   Social History: The patient  reports that he quit smoking about 4 years ago. His smoking use included Cigarettes. He has a 15 pack-year smoking history. He has never used smokeless tobacco. He reports that he does not drink alcohol or use illicit drugs.   ROS:  Please see the history of present illness. Otherwise, complete review of systems is positive for none.  All other systems are reviewed and negative.   Physical Exam: VS:  BP 110/70 mmHg  Pulse 54  Wt 118 lb (53.524 kg)  SpO2 96%, BMI Body mass index is 19.05 kg/(m^2).  Wt Readings from Last 3 Encounters:  07/21/15 118 lb (53.524 kg)  07/14/15 106 lb (48.081 kg)  07/10/15 106 lb (48.081 kg)    General: Thin elderly male seated in wheelchair. HEENT: Conjunctiva and lids normal, oropharynx clear. Neck: Supple, no elevated JVP or carotid bruits, no thyromegaly. Lungs: Clear to auscultation, nonlabored breathing at rest. Cardiac: Regular rate and rhythm, no S3, soft systolic murmur, no pericardial rub. Abdomen: Soft, nontender, bowel sounds present. PEG in place. Extremities: No pitting edema, distal pulses 2+.  ECG: Tracing from 09/06/2014 showed sinus rhythm with incomplete left bundle branch block.  Recent Labwork: 09/05/2014: ALT 208*; AST 142*; Hemoglobin 9.1*; Platelets 296 09/06/2014: Magnesium 2.1 09/07/2014: BUN 34*; Creatinine, Ser 0.94; Potassium 4.2; Sodium 143     Component Value Date/Time   CHOL 150 06/01/2011 1045   TRIG 125 06/01/2011 1045   HDL 55 06/01/2011  1045   CHOLHDL 2.7 06/01/2011 1045   VLDL 25 06/01/2011 1045   LDLCALC 70 06/01/2011 1045    Other Studies Reviewed Today:  Echocardiogram 04/09/2014: Study Conclusions  - Left ventricle: The cavity size was normal. Wall thickness was normal. Systolic function was severely reduced. The estimated ejection fraction was in the range of 20% to 25%. Diffuse severe global hypokinesis. The study is not technically sufficient to allow evaluation of LV diastolic function. - Aortic valve: Mildly calcified annulus. Mildly thickened leaflets. Grossly there is no significant stenosis or regurgitation. Doppler images are insufficient to accurately measure gradients or velocities. - Right ventricle: Not well  visualized. Grossly appears normal in size and function. - Tricuspid valve: Not well visualized. Grossly there is no significant regurgitation or stenosis. - Technically difficult study.  Assessment and Plan:  1. Intermittent bradycardia. I reviewed available vital signs from his nursing facility, heart rate was in the 70s on December 5th. For now would continue amiodarone at 100 mg daily, recently reduced. He was placed on this with history of cardiac arrest and cardiomyopathy, not for atrial arrhythmias. He is in an ectopic atrial bradycardia today with stable blood pressure and no symptoms. If his heart rate gets more consistently in the 40s, would stop amiodarone altogether.  2. Ischemic cardiomyopathy with LVEF 20-25%, continuing with conservative management, no aggressive workup. He is no longer on Diovan or atenolol which he had tolerated in the past.  3. History of essential hypertension, blood pressure is normal today.  Current medicines were reviewed with the patient today.   Orders Placed This Encounter  Procedures  . EKG 12-Lead    Disposition: FU with me in 1 year.   Signed, Jonelle Sidle, MD, [facility] 07/21/2015 10:38 AM    [facility]  HeartCare at [facility] [address] [place], [place] [address] Phone: [phone]; Fax: [phone]

## 2015-07-21 NOTE — Patient Instructions (Signed)
Your physician wants you to follow-up in: 1 year with Dr McDowell You will receive a reminder letter in the mail two months in advance. If you don't receive a letter, please call our office to schedule the follow-up appointment.    Your physician recommends that you continue on your current medications as directed. Please refer to the Current Medication list given to you today.     If you need a refill on your cardiac medications before your next appointment, please call your pharmacy.     Thank you for choosing [facility] !        

## 2015-07-22 ENCOUNTER — Encounter (HOSPITAL_COMMUNITY): Payer: Self-pay | Admitting: Gastroenterology

## 2015-07-27 ENCOUNTER — Telehealth: Payer: Self-pay | Admitting: Gastroenterology

## 2015-07-27 NOTE — Telephone Encounter (Signed)
Please call pt'S FACILITY. His stomach Bx shows gastritis. CONTINUE PROTONIX. TAKE 30 MINUTES PRIOR TO LUNCH.

## 2015-07-28 NOTE — Telephone Encounter (Signed)
Called and informed pt's nurse  Tresa Endo, at [facility].

## 2015-08-29 ENCOUNTER — Telehealth: Payer: Self-pay

## 2015-08-29 NOTE — Telephone Encounter (Signed)
Telephone call from [facility] at [facility], pt pulled out his peg on Friday and they placed a foley. He is able to take his meds and get nutrition. They want to know when Dr. Darrick Penna can replace the peg tube. They are aware that she is off today and will be back at the hospital tomorrow. They asked if she would come to the nursing home or does he need an appt. Please advise!

## 2015-08-29 NOTE — Telephone Encounter (Signed)
Discuss with Dr. Darrick Penna first thing in the morning. Routed to RMR in case any other advice on his part.

## 2015-08-29 NOTE — Telephone Encounter (Signed)
Sending to Ginger who said she will take care of this on Tues morning since I will not be in the office on Tues.

## 2015-08-30 ENCOUNTER — Encounter (HOSPITAL_COMMUNITY)
Admission: RE | Disposition: A | Discharge status: Home / Self Care | Payer: Self-pay | Source: Ambulatory Visit | Attending: Gastroenterology

## 2015-08-30 ENCOUNTER — Encounter (HOSPITAL_COMMUNITY): Payer: Self-pay | Admitting: *Deleted

## 2015-08-30 ENCOUNTER — Ambulatory Visit (HOSPITAL_COMMUNITY)
Admission: RE | Admit: 2015-08-30 | Discharge: 2015-08-30 | Disposition: A | Discharge status: Home / Self Care | Payer: Medicare Other | Source: Ambulatory Visit | Attending: Gastroenterology | Admitting: Gastroenterology

## 2015-08-30 ENCOUNTER — Other Ambulatory Visit: Payer: Self-pay

## 2015-08-30 DIAGNOSIS — E43 Unspecified severe protein-calorie malnutrition: Secondary | ICD-10-CM

## 2015-08-30 DIAGNOSIS — R1310 Dysphagia, unspecified: Secondary | ICD-10-CM

## 2015-08-30 DIAGNOSIS — Z931 Gastrostomy status: Secondary | ICD-10-CM

## 2015-08-30 DIAGNOSIS — Z431 Encounter for attention to gastrostomy: Secondary | ICD-10-CM | POA: Diagnosis not present

## 2015-08-30 HISTORY — PX: PEG PLACEMENT: SHX5437

## 2015-08-30 SURGERY — REPLACEMENT, PEG TUBE, WITHOUT ENDOSCOPY
Anesthesia: LOCAL

## 2015-08-30 NOTE — Telephone Encounter (Signed)
Route to CM  

## 2015-08-30 NOTE — Progress Notes (Signed)
Reported to Dorothy Puffer at [facility] nursing [facility] facility.  Pt to [facility] per [facility] via w/c

## 2015-08-30 NOTE — Telephone Encounter (Signed)
SEND PT TO ENDO TODAY FOR PEG CHANGE.

## 2015-08-30 NOTE — Telephone Encounter (Signed)
Noted. Pt to arrive at 1:30pm [facility] aware

## 2015-08-30 NOTE — Brief Op Note (Signed)
08/30/2015  3:09 PM  PATIENT:  [name]  82 y.o. male  PRE-OPERATIVE DIAGNOSIS:  DYSPHAGIA  POST-OPERATIVE DIAGNOSIS:  BEDISIDE PEG CHANGE  PROCEDURE:  Procedure(s) with comments: PERCUTANEOUS ENDOSCOPIC GASTROSTOMY (PEG) REPLACEMENT (N/A) - 1415  SURGEON:  Surgeon(s) and Role:    * West Bali, MD - Primary   PROCEDURE TECHNIQUE:  PT PREPPED AND DRAPED. 5 CC ASPIRATED FROM 20 Fr FOLEY. BALLOON CHECKED. REPLACED WITH 20 FR BALLOON PEG. 6 CC STERILE WATER IN BALLOON. SKIN AT 3 CM. TOP OF BUMPER AT 5 CM. ALL PORTS FLUSHED AND ASPIRATED PRIOR TO AND AFTER INSERTION.  PLAN: 1. OK TO USE PEG FOR MEDS AND FEEDS 2. CHECK POSITION QSHIFT AND DOCUMENT

## 2015-08-30 NOTE — Discharge Instructions (Signed)
Peg replaced at bedside. Skin at 3 cm and top of bumper at 5 cm   PLAN: 1. OK TO USE PEG FOR MEDS AND FEEDS 2. CHECK POSITION QSHIFT AND DOCUMENT

## 2015-08-30 NOTE — Telephone Encounter (Signed)
Orders placed.

## 2015-08-30 NOTE — H&P (Signed)
Primary Care Physician:  Samuel Jester, DO Primary Gastroenterologist:  Dr. Darrick Penna  Pre-Procedure History & Physical: HPI:  [name] is a 82 y.o. male here for PEG FELL OUT.  Past Medical History  Diagnosis Date  . Coronary atherosclerosis of native coronary artery     Multivessel  . Essential hypertension, benign   . Mixed hyperlipidemia   . Erectile dysfunction   . Osteoarthritis   . Myocardial infarction (HCC)     IMI 1987 and PLMI 1997  . Rheumatoid arthritis, adult (HCC)   . DNR (do not resuscitate)   . Cardiac arrest (HCC) 05/2014  . Ischemic cardiomyopathy     Past Surgical History  Procedure Laterality Date  . Coronary artery bypass graft  1987  . Carpal tunnel release    . Rotator cuff repair    . Neck surgery    . Esophagogastroduodenoscopy N/A 01/23/2013    large Mallory Weiss Tear-suspect rebleeding from that but fundic source still possible  . Esophagogastroduodenoscopy N/A 01/23/2013    Procedure: ESOPHAGOGASTRODUODENOSCOPY (EGD);  Surgeon: Shirley Friar, MD;  Location: [facility] ENDOSCOPY;  Service: Endoscopy;  Laterality: N/A;  . Laparotomy N/A 04/06/2014    Procedure: EXPLORATORY LAPAROTOMY, GRAHAM PLICATION;  Surgeon: Dalia Heading, MD;  Location: [facility] ORS;  Service: General;  Laterality: N/A;  . Central venous catheter insertion Left 04/06/2014    Procedure: INSERTION CENTRAL LINE ADULT;  Surgeon: Dalia Heading, MD;  Location: [facility] ORS;  Service: General;  Laterality: Left;  . Peg placement N/A 09/17/2014    Procedure: PERCUTANEOUS ENDOSCOPIC GASTROSTOMY (PEG) PLACEMENT;  Surgeon: West Bali, MD;  Location: [facility] ENDO SUITE;  Service: Endoscopy;  Laterality: N/A;  215 - moved to 12:15 - Ginger to notify pt  . Esophagogastroduodenoscopy N/A 09/17/2014    [id number] at the gastroesophageal junction and cricopharyngeus/mild non-erosive gastritis  . Peg placement N/A 02/15/2015    Procedure: PERCUTANEOUS ENDOSCOPIC GASTROSTOMY (PEG) REPLACEMENT;  Surgeon: West Bali, MD;  Location: [facility] ENDO SUITE;  Service: Endoscopy;  Laterality: N/A;  . Peg placement N/A 07/14/2015    Procedure: PERCUTANEOUS ENDOSCOPIC GASTROSTOMY (PEG) PLACEMENT;  Surgeon: West Bali, MD;  Location: [facility] ENDO SUITE;  Service: Endoscopy;  Laterality: N/A;  1200  . Esophagogastroduodenoscopy N/A 07/14/2015    Procedure: ESOPHAGOGASTRODUODENOSCOPY (EGD);  Surgeon: West Bali, MD;  Location: [facility] ENDO SUITE;  Service: Endoscopy;  Laterality: N/A;    Prior to Admission medications   Medication Sig Start Date End Date Taking? Authorizing Provider  acetaminophen (TYLENOL) 500 MG tablet Take 1,000 mg by mouth every 6 (six) hours as needed for mild pain or moderate pain.    Historical Provider, MD  Amino Acids-Protein Hydrolys (FEEDING SUPPLEMENT, PRO-STAT SUGAR FREE 64,) LIQD Take 30 mLs by mouth 2 (two) times daily.    Historical Provider, MD  amiodarone (PACERONE) 100 MG tablet 1 po daily 07/14/15   West Bali, MD  atorvastatin (LIPITOR) 10 MG tablet Take 10 mg by mouth at bedtime.    Historical Provider, MD  bisacodyl (DULCOLAX) 10 MG suppository Place 10 mg rectally as needed for moderate constipation.    Historical Provider, MD  Cholecalciferol (VITAMIN D) 2000 UNITS CAPS Take 1 capsule by mouth daily.    Historical Provider, MD  citalopram (CELEXA) 10 MG tablet Take 2 tablets (20 mg total) by mouth daily. Patient taking differently: Take 10 mg by mouth daily.  04/13/14   Rhetta Mura, MD  ezetimibe (ZETIA) 10 MG tablet Take 10 mg  by mouth daily.      Historical Provider, MD  ferrous sulfate 325 (65 FE) MG tablet Take 325 mg by mouth 2 (two) times daily with a meal.    Historical Provider, MD  finasteride (PROSCAR) 5 MG tablet Take 5 mg by mouth daily.    Historical Provider, MD  furosemide (LASIX) 20 MG tablet Take 1 tablet (20 mg total) by mouth daily. 01/05/13   Jonelle Sidle, MD  ipratropium-albuterol (DUONEB) 0.5-2.5 (3) MG/3ML SOLN Take 3 mLs by nebulization every  8 (eight) hours as needed (for congestion).    Historical Provider, MD  levalbuterol Pauline Aus) 0.63 MG/3ML nebulizer solution Take 0.63 mg by nebulization 3 (three) times daily.     Historical Provider, MD  magnesium oxide (MAG-OX) 400 (241.3 MG) MG tablet Take 1 tablet (400 mg total) by mouth daily. 04/13/14   Rhetta Mura, MD  morphine (MS CONTIN) 15 MG 12 hr tablet Take 15 mg by mouth every 12 (twelve) hours.    Historical Provider, MD  nitroGLYCERIN (NITROSTAT) 0.4 MG SL tablet Place 1 tablet (0.4 mg total) under the tongue every 5 (five) minutes as needed for chest pain. 06/29/13   Jonelle Sidle, MD  oxyCODONE (ROXICODONE) 15 MG immediate release tablet Take 1 tablet (15 mg total) by mouth every 4 (four) hours as needed for pain. 09/07/14   Henderson Cloud, MD  pantoprazole (PROTONIX) 40 MG tablet Take 1 tablet (40 mg total) by mouth daily at 12 noon. Patient taking differently: Take 40 mg by mouth daily.  04/13/14   Rhetta Mura, MD  polyethylene glycol (MIRALAX / GLYCOLAX) packet Take 17 g by mouth daily.    Historical Provider, MD  risperiDONE (RISPERDAL) 0.25 MG tablet Take 0.25 mg by mouth 2 (two) times daily.    Historical Provider, MD  senna (SENOKOT) 8.6 MG TABS tablet Take 1 tablet by mouth at bedtime.    Historical Provider, MD  Thiamine HCl (VITAMIN B-1) 250 MG tablet Take 250 mg by mouth daily.      Historical Provider, MD  vitamin C (ASCORBIC ACID) 500 MG tablet Take 500 mg by mouth 2 (two) times daily.    Historical Provider, MD  zolpidem (AMBIEN) 5 MG tablet Take 5 mg by mouth at bedtime as needed for sleep.    Historical Provider, MD    Allergies as of 08/30/2015  . (No Known Allergies)    Family History  Problem Relation Age of Onset  . Coronary artery disease Father     Died age 28 with MI  . Diabetes type II Sister     Social History   Social History  . Marital Status: Married    Spouse Name: N/A  . Number of Children: N/A  . Years of  Education: N/A   Occupational History  . retired     former Tax adviser; former Office manager   Social History Main Topics  . Smoking status: Former Smoker -- 0.25 packs/day for 60 years    Types: Cigarettes    Quit date: 07/14/2011  . Smokeless tobacco: Never Used     Comment: stopped 05-29-13  . Alcohol Use: No     Comment: one beeer every 6 months  . Drug Use: No  . Sexual Activity: Not on file   Other Topics Concern  . Not on file   Social History Narrative    Review of Systems: See HPI, otherwise negative ROS   Physical Exam: There were no vitals taken  for this visit. General:   Alert,  pleasant and cooperative in NAD Head:  Normocephalic and atraumatic. Neck:  Supple; Lungs:  Clear throughout to auscultation.    Heart:  Regular rate and rhythm. Abdomen:  Soft, nontender and nondistended. Normal bowel sounds, without guarding, and without rebound.   Neurologic:  Alert and  oriented x4;  grossly normal neurologically.  Impression/Plan:     MALFUNCTIONING PEG  PLAN:  BEDSIDE PEG CHANGE

## 2015-08-31 NOTE — Telephone Encounter (Signed)
Reviewed

## 2015-09-01 ENCOUNTER — Encounter (HOSPITAL_COMMUNITY): Payer: Self-pay | Admitting: Gastroenterology

## 2015-11-03 ENCOUNTER — Telehealth: Payer: Self-pay

## 2015-11-03 ENCOUNTER — Other Ambulatory Visit: Payer: Self-pay

## 2015-11-03 DIAGNOSIS — Z431 Encounter for attention to gastrostomy: Secondary | ICD-10-CM

## 2015-11-03 NOTE — Telephone Encounter (Signed)
Spoke with Dr. Darrick Penna. Unable to do peg placement today. Pt has been place on her schedule at 1200 pm on 03/31. Spoke with Renea Ee from [facility] and she is aware to have pt at the Endo unit at 11:00 on 11/04/2015

## 2015-11-03 NOTE — Telephone Encounter (Signed)
REVIEWED-NO ADDITIONAL RECOMMENDATIONS. 

## 2015-11-03 NOTE — Telephone Encounter (Signed)
Evelyn from [facility] called and states that patients Peg tube came out last night and it is now being held open with foley cath. Wanted to know if DR. Fields can come over and replace some time today.

## 2015-11-04 ENCOUNTER — Ambulatory Visit (HOSPITAL_COMMUNITY)
Admission: RE | Admit: 2015-11-04 | Discharge: 2015-11-04 | Disposition: A | Discharge status: Skilled Nursing Facility | Payer: Medicare Other | Source: Ambulatory Visit | Attending: Gastroenterology | Admitting: Gastroenterology

## 2015-11-04 ENCOUNTER — Encounter (HOSPITAL_COMMUNITY)
Admission: RE | Disposition: A | Discharge status: Skilled Nursing Facility | Payer: Self-pay | Source: Ambulatory Visit | Attending: Gastroenterology

## 2015-11-04 ENCOUNTER — Encounter (HOSPITAL_COMMUNITY): Payer: Self-pay | Admitting: *Deleted

## 2015-11-04 DIAGNOSIS — Z431 Encounter for attention to gastrostomy: Secondary | ICD-10-CM | POA: Insufficient documentation

## 2015-11-04 DIAGNOSIS — R1310 Dysphagia, unspecified: Secondary | ICD-10-CM | POA: Insufficient documentation

## 2015-11-04 HISTORY — PX: PEG PLACEMENT: SHX5437

## 2015-11-04 SURGERY — REPLACEMENT, PEG TUBE, WITHOUT ENDOSCOPY
Anesthesia: LOCAL

## 2015-11-04 NOTE — OR Nursing (Signed)
Report called to Jacalyn Lefevre LPN at [facility].

## 2015-11-04 NOTE — Discharge Instructions (Signed)
Resume tube feedings and medications. Bumper at 5cm and Skin at 3cm.

## 2015-11-05 NOTE — Op Note (Signed)
[facility] Patient Name: [name] Procedure Date: 11/04/2015 11:37 PM MRN: [id number] Date of Birth: 06/13/1933 Attending MD: Jonette Eva , MD CSN: [id number] Age: 82 Admit Type: Outpatient Procedure:                Non-endoscopic Tube Procedure Indications:              Dysphagia Providers:                Jonette Eva, MD Referring MD:              Medicines:                None Complications:            No immediate complications. Estimated Blood Loss:     Estimated blood loss was minimal. Procedure:                Pre-Anesthesia Assessment:                           - Prior to the procedure, a History and Physical                            was performed, and patient medications and                            allergies were reviewed. The patient's tolerance of                            previous anesthesia was also reviewed. The risks                            and benefits of the procedure and the sedation                            options and risks were discussed with the patient.                            All questions were answered, and informed consent                            was obtained. Prior Anticoagulants: The patient has                            taken no previous anticoagulant or antiplatelet                            agents. ASA Grade Assessment: II - A patient with                            mild systemic disease. After reviewing the risks                            and benefits, the patient was deemed in                            satisfactory  condition to undergo the procedure.                           After obtaining informed consent, the site was                            prepped and the procedure was performed.The                            procedure was accomplished with ease. The patient                            tolerated the procedure well. Findings:      The existing gastrostomy tube has been completely dislodged. The       existing  gastrostomy site was examined and cleaned. A 20 Fr Bard       gastrostomy tube was lubricated and placed into the existing gastrostomy       port. A total of 6 mL saline was used to distend the balloon that was       previously tested. When positioned, the skin marking was noted to be 5       cm at the external bumper. The final tension and compression of the       abdominal wall by the gastrostomy tube and external bumper were checked       and revealed that the bumper was loose and lightly touching the skin.       Placement into the stomach was confirmed with flushing, aspiration and       auscultation. The tube was capped, and the tube site was cleaned and       dressed. Estimated blood loss was minimal. Impression:               - The previously removed gastrostomy tube was                            replaced with a 20 Fr Bard gastrostomy tube.                           - No specimens collected. Moderate Sedation:      NONE Recommendation:           - Patient has a contact number available for                            emergencies. The signs and symptoms of potential                            delayed complications were discussed with the                            patient. Return to normal activities tomorrow.                            Written discharge instructions were provided to the                            patient.                           -  Resume previous diet.                           - Continue present medications. Procedure Code(s):        --- Professional ---                           [phone], Change of gastrostomy tube, percutaneous,                            without imaging or endoscopic guidance Diagnosis Code(s):        --- Professional ---                           Z43.1, Encounter for attention to gastrostomy                           R13.10, Dysphagia, unspecified CPT copyright 2016 American Medical Association. All rights reserved. The codes documented in  this report are preliminary and upon coder review may  be revised to meet current compliance requirements. Jonette Eva, MD Jonette Eva, MD 11/05/2015 1:03:52 AM This report has been signed electronically. Number of Addenda: 0

## 2015-11-07 ENCOUNTER — Encounter (HOSPITAL_COMMUNITY): Payer: Self-pay | Admitting: Gastroenterology

## 2016-04-19 ENCOUNTER — Telehealth: Payer: Self-pay | Admitting: Gastroenterology

## 2016-04-19 NOTE — Telephone Encounter (Signed)
Pt is deceased. 

## 2016-04-19 NOTE — Telephone Encounter (Signed)
Noted  

## 2016-04-20 NOTE — Telephone Encounter (Addendum)
REVIEWED-MAY HE REST IN [place].

## 2016-05-06 DEATH — deceased
